# Patient Record
Sex: Female | Born: 1970 | Race: Black or African American | Hispanic: No | Smoking: Never smoker
Health system: Southern US, Community
[De-identification: ages and names within clinical notes are randomized; demographics above are authoritative.]

## PROBLEM LIST (undated history)

## (undated) DIAGNOSIS — I10 Essential (primary) hypertension: Secondary | ICD-10-CM

## (undated) DIAGNOSIS — J45909 Unspecified asthma, uncomplicated: Secondary | ICD-10-CM

## (undated) DIAGNOSIS — L309 Dermatitis, unspecified: Secondary | ICD-10-CM

## (undated) HISTORY — DX: Dermatitis, unspecified: L30.9

---

## 2018-01-16 ENCOUNTER — Ambulatory Visit (HOSPITAL_COMMUNITY)
Admission: EM | Admit: 2018-01-16 | Discharge: 2018-01-16 | Disposition: A | Discharge status: Home / Self Care | Payer: Medicaid Other | Attending: Family Medicine | Admitting: Family Medicine

## 2018-01-16 ENCOUNTER — Encounter (HOSPITAL_COMMUNITY): Payer: Self-pay | Admitting: Emergency Medicine

## 2018-01-16 ENCOUNTER — Other Ambulatory Visit: Payer: Self-pay

## 2018-01-16 DIAGNOSIS — Z8249 Family history of ischemic heart disease and other diseases of the circulatory system: Secondary | ICD-10-CM | POA: Insufficient documentation

## 2018-01-16 DIAGNOSIS — B349 Viral infection, unspecified: Secondary | ICD-10-CM | POA: Insufficient documentation

## 2018-01-16 DIAGNOSIS — I10 Essential (primary) hypertension: Secondary | ICD-10-CM | POA: Insufficient documentation

## 2018-01-16 DIAGNOSIS — J3489 Other specified disorders of nose and nasal sinuses: Secondary | ICD-10-CM | POA: Insufficient documentation

## 2018-01-16 DIAGNOSIS — J029 Acute pharyngitis, unspecified: Secondary | ICD-10-CM | POA: Insufficient documentation

## 2018-01-16 DIAGNOSIS — R0981 Nasal congestion: Secondary | ICD-10-CM

## 2018-01-16 DIAGNOSIS — J069 Acute upper respiratory infection, unspecified: Secondary | ICD-10-CM | POA: Insufficient documentation

## 2018-01-16 DIAGNOSIS — H9209 Otalgia, unspecified ear: Secondary | ICD-10-CM | POA: Insufficient documentation

## 2018-01-16 DIAGNOSIS — R05 Cough: Secondary | ICD-10-CM

## 2018-01-16 DIAGNOSIS — J45909 Unspecified asthma, uncomplicated: Secondary | ICD-10-CM | POA: Insufficient documentation

## 2018-01-16 HISTORY — DX: Unspecified asthma, uncomplicated: J45.909

## 2018-01-16 HISTORY — DX: Essential (primary) hypertension: I10

## 2018-01-16 MED ORDER — FLUTICASONE PROPIONATE 50 MCG/ACT NA SUSP: 1.0000 | Freq: Every day | NASAL | 2 refills | Status: DC

## 2018-01-16 MED ORDER — CETIRIZINE HCL 10 MG PO TABS: 10.0000 mg | ORAL_TABLET | Freq: Every day | ORAL | 0 refills | Status: DC

## 2018-01-16 NOTE — Discharge Instructions (Addendum)
Rapid strep test was negative This is most likely a viral infection We will treat the congestion with Flonase nasal spray and Zyrtec daily You can also try mucinex for cough and congestion.  Follow up as needed for continued or worsening symptoms

## 2018-01-16 NOTE — ED Triage Notes (Signed)
One week history of cold symptoms.  Bilateral ear pain, sore throat, and cough.

## 2018-01-16 NOTE — ED Provider Notes (Signed)
[facility]    [name]: [id number] Arrival date & time: 01/16/18  1300     History   Chief Complaint Chief Complaint  Patient presents with  . URI    HPI Deborah Mcclain is a 47 y.o. female.    URI  Presenting symptoms: congestion, cough, ear pain, rhinorrhea and sore throat   Severity:  Moderate Duration:  1 week Timing:  Constant Progression:  Waxing and waning Chronicity:  New Relieved by:  Hot fluids Worsened by:  Nothing Associated symptoms: sinus pain   Associated symptoms: no arthralgias, no headaches, no myalgias, no neck pain, no sneezing, no swollen glands and no wheezing   Risk factors: sick contacts   Risk factors: no recent illness and no recent travel     Past Medical History:  Diagnosis Date  . Asthma   . Hypertension     There are no active problems to display for this patient.   History reviewed. No pertinent surgical history.  OB History   None      Home Medications    Prior to Admission medications   Medication Sig Start Date End Date Taking? Authorizing Provider  NON FORMULARY    Yes [provider]  cetirizine (ZYRTEC) 10 MG tablet Take 1 tablet (10 mg total) by mouth daily. 01/16/18   Laterra Lubinski, Gloris Manchester A, NP  fluticasone (FLONASE) 50 MCG/ACT nasal spray Place 1 spray into both nostrils daily. 01/16/18   Janace Aris, NP    Family History Family History  Problem Relation Age of Onset  . Diabetes Mother   . Hypertension Mother     Social History Social History   Tobacco Use  . Smoking status: Never Smoker  Substance Use Topics  . Alcohol use: Yes  . Drug use: Never     Allergies   Patient has no known allergies.   Review of Systems Review of Systems  HENT: Positive for congestion, ear pain, rhinorrhea, sinus pain and sore throat. Negative for sneezing.   Respiratory: Positive for cough. Negative for wheezing.   Musculoskeletal: Negative for arthralgias, myalgias and neck pain.  Neurological: Negative  for headaches.     Physical Exam Triage Vital Signs ED Triage Vitals  Enc Vitals Group     BP 01/16/18 1403 (!) 153/86     Pulse Rate 01/16/18 1403 73     Resp 01/16/18 1403 20     Temp 01/16/18 1403 98.5 F (36.9 C)     Temp Source 01/16/18 1403 Oral     SpO2 01/16/18 1403 100 %     Weight --      Height --      Head Circumference --      Peak Flow --      Pain Score 01/16/18 1359 8     Pain Loc --      Pain Edu? --      Excl. in GC? --    No data found.  Updated Vital Signs BP (!) 153/86 (BP Location: Right Arm)   Pulse 73   Temp 98.5 F (36.9 C) (Oral)   Resp 20   SpO2 100%   Visual Acuity Right Eye Distance:   Left Eye Distance:   Bilateral Distance:    Right Eye Near:   Left Eye Near:    Bilateral Near:     Physical Exam  Constitutional: She appears well-developed and well-nourished.  Very pleasant. Non toxic or ill appearing.   HENT:  Head: Normocephalic and atraumatic.  Right Ear: External ear normal.  Left Ear: External ear normal.  Bilateral TMs normal.  External ears normal.  Mild posterior oropharyngeal erythema, 2+ tonsillar swelling without exudates. No lesions.  Mild nasal turbinate swelling.  No lymphadenopathy.    Eyes: Conjunctivae are normal.  Neck: Normal range of motion.  Cardiovascular: Normal rate, regular rhythm and normal heart sounds.  Pulmonary/Chest: Effort normal and breath sounds normal.  Lungs clear in all fields. No dyspnea or distress. No retractions or nasal flaring.   Musculoskeletal: Normal range of motion.  Lymphadenopathy:    She has no cervical adenopathy.  Neurological: She is alert.  Skin: Skin is warm. No rash noted. No erythema. No pallor.  Psychiatric: She has a normal mood and affect.  Nursing note and vitals reviewed.    UC Treatments / Results  Labs (all labs ordered are listed, but only abnormal results are displayed) Labs Reviewed  CULTURE, GROUP A STREP [facility])    EKG None  Radiology No  results found.  Procedures Procedures (including critical care time)  Medications Ordered in UC Medications - No data to display  Initial Impression / Assessment and Plan / UC Course  I have reviewed the triage vital signs and the nursing notes.  Pertinent labs & imaging results that were available during my care of the patient were reviewed by me and considered in my medical decision making (see chart for details).     Rapid strep negative Most likely viral URI We will treat with Zyrtec and Flonase daily mucinex for congestion.  Over-the-counter cough medication as needed Follow up as needed for continued or worsening symptoms  Final Clinical Impressions(s) / UC Diagnoses   Final diagnoses:  Viral syndrome     Discharge Instructions     Rapid strep test was negative This is most likely a viral infection We will treat the congestion with Flonase nasal spray and Zyrtec daily Follow up as needed for continued or worsening symptoms     ED Prescriptions    Medication Sig Dispense Auth. Provider   fluticasone (FLONASE) 50 MCG/ACT nasal spray Place 1 spray into both nostrils daily. 16 g Giovonnie Trettel A, NP   cetirizine (ZYRTEC) 10 MG tablet Take 1 tablet (10 mg total) by mouth daily. 30 tablet Dahlia Byes A, NP     Controlled Substance Prescriptions [place] Controlled Substance Registry consulted? Not Applicable   Janace Aris, NP 01/16/18 1438

## 2018-01-19 LAB — CULTURE, GROUP A STREP (THRC)

## 2018-05-01 ENCOUNTER — Encounter (HOSPITAL_COMMUNITY): Payer: Self-pay | Admitting: Emergency Medicine

## 2018-05-01 ENCOUNTER — Ambulatory Visit (HOSPITAL_COMMUNITY)
Admission: EM | Admit: 2018-05-01 | Discharge: 2018-05-01 | Disposition: A | Discharge status: Other Acute Inpatient Hospital | Payer: Medicaid Other

## 2018-05-01 ENCOUNTER — Emergency Department (HOSPITAL_COMMUNITY): Payer: Medicaid Other

## 2018-05-01 ENCOUNTER — Emergency Department (HOSPITAL_COMMUNITY)
Admission: EM | Admit: 2018-05-01 | Discharge: 2018-05-01 | Disposition: A | Discharge status: Home / Self Care | Payer: Medicaid Other | Attending: Emergency Medicine | Admitting: Emergency Medicine

## 2018-05-01 DIAGNOSIS — I10 Essential (primary) hypertension: Secondary | ICD-10-CM | POA: Insufficient documentation

## 2018-05-01 DIAGNOSIS — J45909 Unspecified asthma, uncomplicated: Secondary | ICD-10-CM | POA: Diagnosis not present

## 2018-05-01 DIAGNOSIS — F0781 Postconcussional syndrome: Secondary | ICD-10-CM

## 2018-05-01 DIAGNOSIS — Z79899 Other long term (current) drug therapy: Secondary | ICD-10-CM | POA: Insufficient documentation

## 2018-05-01 DIAGNOSIS — M4802 Spinal stenosis, cervical region: Secondary | ICD-10-CM | POA: Diagnosis not present

## 2018-05-01 DIAGNOSIS — R51 Headache: Secondary | ICD-10-CM | POA: Diagnosis present

## 2018-05-01 DIAGNOSIS — M5412 Radiculopathy, cervical region: Secondary | ICD-10-CM

## 2018-05-01 MED ORDER — IBUPROFEN 600 MG PO TABS: 600.0000 mg | ORAL_TABLET | Freq: Four times a day (QID) | ORAL | 0 refills | Status: DC | PRN

## 2018-05-01 MED ORDER — METHOCARBAMOL 500 MG PO TABS: 1000.0000 mg | ORAL_TABLET | Freq: Three times a day (TID) | ORAL | 0 refills | Status: DC | PRN
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
## 2018-05-01 NOTE — ED Notes (Signed)
Patient verbalizes understanding of discharge instructions. Opportunity for questioning and answers were provided. Armband removed by staff, pt discharged from ED ambulatory.   

## 2018-07-03 ENCOUNTER — Encounter (HOSPITAL_COMMUNITY): Payer: Self-pay | Admitting: Emergency Medicine

## 2018-07-03 ENCOUNTER — Other Ambulatory Visit: Payer: Self-pay

## 2018-07-03 ENCOUNTER — Ambulatory Visit (HOSPITAL_COMMUNITY)
Admission: EM | Admit: 2018-07-03 | Discharge: 2018-07-03 | Disposition: A | Discharge status: Home / Self Care | Payer: Medicaid Other | Attending: Family Medicine | Admitting: Family Medicine

## 2018-07-03 DIAGNOSIS — M5412 Radiculopathy, cervical region: Secondary | ICD-10-CM | POA: Diagnosis not present

## 2018-07-03 MED ORDER — METHOCARBAMOL 500 MG PO TABS: 1000.0000 mg | ORAL_TABLET | Freq: Three times a day (TID) | ORAL | 0 refills | Status: DC | PRN

## 2018-07-03 MED ORDER — PREDNISONE 10 MG (21) PO TBPK: ORAL_TABLET | Freq: Every day | ORAL | 0 refills | Status: DC
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
## 2018-07-03 NOTE — ED Triage Notes (Addendum)
Neck pain started Saturday.  Pain then started radiating down both arms.  Left arm pain radiates into fingers.   Patient was playing around with son and fell, but no pain at that time.  Patient reports she stands all day at work and questioning if this is the cause

## 2018-12-13 DIAGNOSIS — E119 Type 2 diabetes mellitus without complications: Secondary | ICD-10-CM

## 2018-12-13 HISTORY — DX: Type 2 diabetes mellitus without complications: E11.9

## 2019-02-04 ENCOUNTER — Other Ambulatory Visit: Payer: Self-pay | Admitting: Family Medicine

## 2019-02-04 DIAGNOSIS — Z1231 Encounter for screening mammogram for malignant neoplasm of breast: Secondary | ICD-10-CM

## 2019-02-26 ENCOUNTER — Ambulatory Visit: Payer: Medicaid Other | Admitting: Obstetrics & Gynecology

## 2019-03-14 ENCOUNTER — Ambulatory Visit: Payer: Medicaid Other | Admitting: Women's Health

## 2019-03-21 ENCOUNTER — Encounter: Payer: Self-pay | Admitting: Gastroenterology

## 2019-03-25 ENCOUNTER — Telehealth: Payer: Self-pay | Admitting: *Deleted

## 2019-03-25 NOTE — Telephone Encounter (Signed)
Direct at [facility] however as of 04/08/2019 [facility] hospitals are stopping elective, non urgent procedures until covid-19 inpatient surge abates so we have to wait to schedule screening and other non urgent procedures. [facility] ambulatory surgical centers, such as the [facility], continue with normal operations.

## 2019-03-25 NOTE — Telephone Encounter (Signed)
Sheri,  I was not sure if Dr Russella Dar had any openings before this starts the 25th .  Do I need to call this pt and explain the situation and cancel her and put in a recall for later this year?  Please advise, Thanks  Hilda Lias

## 2019-03-25 NOTE — Telephone Encounter (Signed)
Dr Russella Dar,  This pt has no GI hx- she is 48 and was referred for a screening colon- here BMI as of 12-19-2018 is 53.87.  She has a hx of Hypertension, asthma, GERD and Neuropathy in her feet.   Do you want an OV or can she be direct at [facility]?  Please advise, thanks so much, Hilda Lias

## 2019-03-25 NOTE — Telephone Encounter (Signed)
No he is full for his next day on 1/18.  Yes will need recall .  Thank you

## 2019-03-25 NOTE — Telephone Encounter (Signed)
Pt instructed we will have to cancel colon and PV-  I placed a recall for March 2021-  Pt aware of reason- I canceled Pv and Colon in the [facility]

## 2019-03-27 ENCOUNTER — Ambulatory Visit: Payer: Medicaid Other | Admitting: Allergy

## 2019-04-01 ENCOUNTER — Ambulatory Visit: Payer: Medicaid Other | Admitting: Advanced Practice Midwife

## 2019-04-10 ENCOUNTER — Encounter: Payer: Medicaid Other | Admitting: Gastroenterology

## 2019-04-16 ENCOUNTER — Other Ambulatory Visit (HOSPITAL_COMMUNITY)
Admission: RE | Admit: 2019-04-16 | Discharge: 2019-04-16 | Disposition: A | Discharge status: Home / Self Care | Payer: Medicaid Other | Source: Ambulatory Visit | Attending: Advanced Practice Midwife | Admitting: Advanced Practice Midwife

## 2019-04-16 ENCOUNTER — Encounter: Payer: Self-pay | Admitting: Advanced Practice Midwife

## 2019-04-16 ENCOUNTER — Other Ambulatory Visit: Payer: Self-pay

## 2019-04-16 ENCOUNTER — Ambulatory Visit: Payer: Medicaid Other | Admitting: Advanced Practice Midwife

## 2019-04-16 VITALS — BP 168/106 | HR 85 | Ht 64.0 in | Wt 308.8 lb

## 2019-04-16 DIAGNOSIS — Z0000 Encounter for general adult medical examination without abnormal findings: Secondary | ICD-10-CM

## 2019-04-16 DIAGNOSIS — Z01419 Encounter for gynecological examination (general) (routine) without abnormal findings: Secondary | ICD-10-CM

## 2019-04-16 DIAGNOSIS — E119 Type 2 diabetes mellitus without complications: Secondary | ICD-10-CM | POA: Insufficient documentation

## 2019-04-16 DIAGNOSIS — B372 Candidiasis of skin and nail: Secondary | ICD-10-CM

## 2019-04-16 DIAGNOSIS — Z794 Long term (current) use of insulin: Secondary | ICD-10-CM | POA: Insufficient documentation

## 2019-04-16 DIAGNOSIS — N951 Menopausal and female climacteric states: Secondary | ICD-10-CM

## 2019-04-16 DIAGNOSIS — Z113 Encounter for screening for infections with a predominantly sexual mode of transmission: Secondary | ICD-10-CM | POA: Insufficient documentation

## 2019-04-16 DIAGNOSIS — R4586 Emotional lability: Secondary | ICD-10-CM

## 2019-04-16 DIAGNOSIS — I10 Essential (primary) hypertension: Secondary | ICD-10-CM | POA: Insufficient documentation

## 2019-04-16 DIAGNOSIS — Z6841 Body Mass Index (BMI) 40.0 and over, adult: Secondary | ICD-10-CM

## 2019-04-16 DIAGNOSIS — Z124 Encounter for screening for malignant neoplasm of cervix: Secondary | ICD-10-CM | POA: Insufficient documentation

## 2019-04-16 DIAGNOSIS — Z1231 Encounter for screening mammogram for malignant neoplasm of breast: Secondary | ICD-10-CM

## 2019-04-16 DIAGNOSIS — E1169 Type 2 diabetes mellitus with other specified complication: Secondary | ICD-10-CM

## 2019-04-16 MED ORDER — VENLAFAXINE HCL ER 75 MG PO CP24: 75.0000 mg | ORAL_CAPSULE | Freq: Every day | ORAL | 3 refills | Status: DC

## 2019-04-16 MED ORDER — NYSTATIN-TRIAMCINOLONE 100000-0.1 UNIT/GM-% EX OINT: 1.0000 "application " | TOPICAL_OINTMENT | Freq: Two times a day (BID) | CUTANEOUS | 2 refills | Status: DC

## 2019-04-16 NOTE — Progress Notes (Signed)
Subjective:     Deborah Mcclain is a 49 y.o. female here at [facility] for a routine exam. Her medical hx is significant for essential HTN on medications and DM Type II on Metformin. She did not take her BP medications today but reports she usually takes them.  She reports some menopausal symptoms including mood swings and night sweats.  The worst symptom is the mood swings.  She also has an itchy, flaky rash around her neck, and between and under her breasts. It started a month or two ago and has worsened.  She denies any chest pain, shortness of breath, numbness, or tingling, or fever/chills.    Do you have a primary care provider? Yes, last seen 12/2018     Gynecologic History Patient's last menstrual period was 09/27/2018. Contraception: tubal ligation Last Pap: unknown. Results were: normal Last mammogram: 2019. Results were: normal  Obstetric History OB History  Gravida Para Term Preterm AB Living  2 2 1 1   3   SAB TAB Ectopic Multiple Live Births        1 3    # Outcome Date GA Lbr Len/2nd Weight Sex Delivery Anes PTL Lv  2A Preterm 03/30/05    F CS-LTranv   LIV  2B Preterm 03/30/05    M CS-LTranv   LIV  1 Term 05/25/97    M CS-LTranv   LIV     The following portions of the patient's history were reviewed and updated as appropriate: allergies, current medications, past family history, past medical history, past social history, past surgical history and problem list.  Review of Systems Pertinent items noted in HPI and remainder of comprehensive ROS otherwise negative.    Objective:   BP (!) 168/106   Pulse 85   Ht 5\' 4"  (1.626 m)   Wt (!) 308 lb 12.8 oz (140.1 kg)   LMP 09/27/2018   BMI 53.01 kg/m \ VS reviewed, nursing note reviewed,  Constitutional: well developed, well nourished, no distress HEENT: normocephalic CV: normal rate Pulm/chest wall: normal effort Breast Exam:  right breast normal without mass, skin or nipple changes or axillary nodes, left breast  normal without mass, skin or nipple changes or axillary nodes.  In between breasts and under breasts bilaterally there is a flaky, grey/white rash with mild erythema  Abdomen: soft Neuro: alert and oriented x 3 Skin: warm, dry, flaky, grey/white rash in ring around lower neck and at sternum in between breasts and below both breasts Psych: affect normal Pelvic exam: Cervix pink, visually closed, without lesion, scant white creamy discharge, vaginal walls and external genitalia normal Bimanual exam: Cervix 0/long/high, firm, anterior, neg CMT, uterus nontender, nonenlarged, adnexa without tenderness, enlargement, or mass     Assessment/Plan:   1. Encounter for screening mammogram for malignant neoplasm of breast  - MM DIGITAL SCREENING BILATERAL; Future  2. Screening for cervical cancer  - Cytology - PAP( [place])  3. Screening examination for STD (sexually transmitted disease)  - Hepatitis B surface antigen - Hepatitis C antibody - HIV Antibody (routine testing w rflx) - RPR - Cervicovaginal ancillary only( [place])  4. Well woman exam with routine gynecological exam --No menses x 7 months with menopausal symptoms increasing in last 4-6 months. --Otherwise no gyn concerns. Not currently sexually active.  5. Essential hypertension --On meds x 2, did not take today.  Encouraged pt to take daily medications, f/u with PCP if BP not well controlled. --No cardiac or neurological symptoms today. Pt  feeling well.  6. Diabetes mellitus type 2 in obese ([place])   7. Morbid obesity with BMI of 50.0-59.9, adult ([place])  8. Mood swings --Likely related to perimenopausal symptoms.  See below. - venlafaxine XR (EFFEXOR-XR) 75 MG 24 hr capsule; Take 1 capsule (75 mg total) by mouth daily.  Dispense: 30 capsule; Refill: 3  9. Menopausal vasomotor syndrome --Discussed comfort measures, cool compresses, cotton clothing, changing sheets, using a fan at night.   --Consult Dr Rip Harbour. Pt has  uterus, no hx hysterectomy. Given mood swings as associated symptoms, recommend Effexor with follow up in 8 weeks. - venlafaxine XR (EFFEXOR-XR) 75 MG 24 hr capsule; Take 1 capsule (75 mg total) by mouth daily.  Dispense: 30 capsule; Refill: 3 --F/U with MD in 8 weeks in office.  10. Skin candidiasis --Skin rash c/w yeast, may have allergic component so Rx for combination steroid/antifungal cream.  Pt to notify office if cream is costly, can switch to nystatin or other antifungal cream and separate triamcinolone ointment.  --F/U with dermatology as scheduled - nystatin-triamcinolone ointment (MYCOLOG); Apply 1 application topically 2 (two) times daily.  Dispense: 30 g; Refill: 2    Follow up in: 8 weeks or as needed.   Fatima Blank, CNM 3:22 PM

## 2019-04-16 NOTE — Progress Notes (Signed)
Pt presents as a New Patient AEX. Patient states that her last pap was 2018 and was normal. Last MM 2018: Normal. Next MM not scheduled.

## 2019-04-16 NOTE — Addendum Note (Signed)
Addended by: Sharen Counter A on: 04/16/2019 05:58 PM   Modules accepted: Level of Service

## 2019-04-17 ENCOUNTER — Encounter: Payer: Self-pay | Admitting: Allergy

## 2019-04-17 ENCOUNTER — Ambulatory Visit (INDEPENDENT_AMBULATORY_CARE_PROVIDER_SITE_OTHER): Payer: Medicaid Other | Admitting: Allergy

## 2019-04-17 VITALS — BP 162/92 | HR 74 | Temp 97.2°F | Resp 16 | Ht 64.0 in | Wt 310.6 lb

## 2019-04-17 DIAGNOSIS — K9049 Malabsorption due to intolerance, not elsewhere classified: Secondary | ICD-10-CM

## 2019-04-17 DIAGNOSIS — H1013 Acute atopic conjunctivitis, bilateral: Secondary | ICD-10-CM | POA: Diagnosis not present

## 2019-04-17 DIAGNOSIS — J4520 Mild intermittent asthma, uncomplicated: Secondary | ICD-10-CM | POA: Diagnosis not present

## 2019-04-17 DIAGNOSIS — L309 Dermatitis, unspecified: Secondary | ICD-10-CM

## 2019-04-17 DIAGNOSIS — J3089 Other allergic rhinitis: Secondary | ICD-10-CM | POA: Diagnosis not present

## 2019-04-17 LAB — HEPATITIS C ANTIBODY: Hep C Virus Ab: <0.1 s/co ratio (ref 0.0–0.9)

## 2019-04-17 LAB — HIV ANTIBODY (ROUTINE TESTING W REFLEX): HIV Screen 4th Generation wRfx: NONREACTIVE

## 2019-04-17 LAB — CERVICOVAGINAL ANCILLARY ONLY
Chlamydia: NEGATIVE
Comment: NEGATIVE
Comment: NEGATIVE
Comment: NORMAL
Neisseria Gonorrhea: NEGATIVE
Trichomonas: NEGATIVE

## 2019-04-17 LAB — CYTOLOGY - PAP
Comment: NEGATIVE
Diagnosis: NEGATIVE
High risk HPV: NEGATIVE

## 2019-04-17 LAB — RPR: RPR Ser Ql: NONREACTIVE

## 2019-04-17 LAB — HEPATITIS B SURFACE ANTIGEN: Hepatitis B Surface Ag: NEGATIVE

## 2019-04-17 MED ORDER — AZELASTINE HCL 0.1 % NA SOLN: 2.0000 | Freq: Two times a day (BID) | NASAL | 5 refills | Status: DC

## 2019-04-17 MED ORDER — MONTELUKAST SODIUM 10 MG PO TABS: 10.0000 mg | ORAL_TABLET | Freq: Every day | ORAL | 5 refills | Status: DC

## 2019-04-17 MED ORDER — LEVOCETIRIZINE DIHYDROCHLORIDE 5 MG PO TABS: 5.0000 mg | ORAL_TABLET | Freq: Every evening | ORAL | 5 refills | Status: DC

## 2019-04-17 NOTE — Progress Notes (Signed)
New Patient Note  RE: Deborah Mcclain MRN: [id number] DOB: 1971-01-31 Date of Office Visit: 04/17/2019  Referring provider: Laruth Bouchard, MD Primary care provider: Marikay Alar, FNP  Chief Complaint: Allergies and asthma  History of present illness: Deborah Mcclain is a 49 y.o. female presenting today for consultation for asthma and allergic rhinitis.  She was diagnosed with asthma around age 26 years old. She reports symptoms of SOB, coughing, difficulty breathing, wheezing, chest tightness.  She does report respiratory illnesses are a trigger or when it gets too hot/humid.  She also does feel when her allergies are worse that her asthma can be impacted.  She does recall needing steroids about twice since her diagnosis.  Has never required any hospitalizations.  She was on a maintenance controller inhaler around 2019 which was discontinued as she was under good control.    She currently only has an albuterol inhaler that at this time she has really infrequent use.  She states that her allergies and asthma symptoms have been a bit better here than when she lived in [place] and in [place].  She has been in the [place] area for the past 2 years.  With her allergies she reports symptoms of itchy/watery eyes, scratchy throat, nasal congestion/drainage, throat clearing.    Pollen seasons are worse for her but symptoms are year-round.  She has been on loratadine for the past couple of years.  She has also taken Zyrtec in the past.  Currently she also states she is using Benadryl at night.  She uses Flonase 2 sprays each nostril daily for the past several years and does report that it helps with her congestion.  She states she is not good at putting eyedrops in her eyes.  Over the past 4 months or so she has noted that when she eats ketchup, spaghetti sauce or anything that is tomato based it will cause her to have itchiness around her neck especially.  The same thing happens with onions.   She states she has been having this itchy darkening rash around her neck that seems to be getting worse.  She states she has been told it is related to insulin resistance or that it could be eczema.  She does not feel that it is related to insulin resistance. She has been applying a cortisone and aloe combination as she states does help decrease the itch.  She does have an upcoming dermatology appointment at the end of the month.  She states another provider did recently recommend she use a nystatin-triamcinolone combination ointment that she has not picked up from the pharmacy yet. She does mention that before the rash and itching started she was bit by something on her neck and then she started noticing the itch more.  No history of food allergy as child.    Review of systems: Review of Systems  Constitutional: Negative.   HENT: Positive for congestion. Negative for ear discharge, nosebleeds and sinus pain.   Eyes: Negative.   Respiratory: Positive for cough.   Cardiovascular: Negative.   Gastrointestinal: Negative.   Musculoskeletal: Negative.   Skin: Positive for itching and rash.  Neurological: Negative.     All other systems negative unless noted above in HPI  Past medical history: Past Medical History:  Diagnosis Date  . Asthma   . Eczema   . Hypertension   . Type 2 diabetes mellitus (HCC) 12/2018    Past surgical history: Past Surgical History:  Procedure Laterality Date  .  CESAREAN SECTION  05/25/1997  . CESAREAN SECTION  03/30/2005   twins    Family history:  Family History  Problem Relation Age of Onset  . Diabetes Mother   . Hypertension Mother   . Diabetes Maternal Grandmother     Social history: Lives in an apartment without carpeting with electric heating and central cooling.  No pets in the home.  No concern for water damage, roaches or mildew in the home.  She is currently unemployed.  Denies a smoking history.  Medication List: Current Outpatient  Medications  Medication Sig Dispense Refill  . cetirizine (ZYRTEC) 10 MG tablet Take 1 tablet (10 mg total) by mouth daily. 30 tablet 0  . fluticasone (FLONASE) 50 MCG/ACT nasal spray Place 1 spray into both nostrils daily. 16 g 2  . gabapentin (NEURONTIN) 100 MG capsule Take 100 mg by mouth 3 (three) times daily.    . hydrochlorothiazide (HYDRODIURIL) 25 MG tablet Take 25 mg by mouth daily.    . metFORMIN (GLUCOPHAGE) 500 MG tablet Take by mouth 2 (two) times daily with a meal.    . metoprolol tartrate (LOPRESSOR) 50 MG tablet Take 50 mg by mouth 2 (two) times daily.    Marland Kitchen nystatin-triamcinolone ointment (MYCOLOG) Apply 1 application topically 2 (two) times daily. 30 g 2  . venlafaxine XR (EFFEXOR-XR) 75 MG 24 hr capsule Take 1 capsule (75 mg total) by mouth daily. 30 capsule 3  . azelastine (ASTELIN) 0.1 % nasal spray Place 2 sprays into both nostrils 2 (two) times daily. 30 mL 5  . levocetirizine (XYZAL) 5 MG tablet Take 1 tablet (5 mg total) by mouth every evening. 30 tablet 5  . montelukast (SINGULAIR) 10 MG tablet Take 1 tablet (10 mg total) by mouth at bedtime. 30 tablet 5  . NON FORMULARY      No current facility-administered medications for this visit.    Known medication allergies: Allergies  Allergen Reactions  . Other     Ketchup, causes itching     Physical examination: Blood pressure (!) 162/92, pulse 74, temperature (!) 97.2 F (36.2 C), temperature source Temporal, resp. rate 16, height 5\' 4"  (1.626 m), weight (!) 310 lb 9.6 oz (140.9 kg), SpO2 99 %.  General: Alert, interactive, in no acute distress. HEENT: PERRLA, TMs pearly gray, turbinates mildly edematous without discharge, post-pharynx non erythematous. Neck: Supple without lymphadenopathy. Lungs: Clear to auscultation without wheezing, rhonchi or rales. {no increased work of breathing. CV: Normal S1, S2 without murmurs. Abdomen: Nondistended, nontender. Skin: Neck with a hyperpigmented plaque all around the  neck with some areas of lichenification..  Ear pinna with mild flakiness Extremities:  No clubbing, cyanosis or edema. Neuro:   Grossly intact.  Diagnositics/Labs:  Spirometry: FEV1: 1.61L 69%, FVC: 1.85L 63%, post albuterol use she had a significant improvement in FEV1 to 2.15 L or 92% predicted which is a 34% increase.  FVC also normalized.  Allergy testing: Environmental allergy skin prick testing is positive to grass pollens, weed pollens, tree pollens, Curvularia, dust mites and cockroach. Select food allergy skin prick testing is negative to tomato and onion. Allergy testing results were read and interpreted by provider, documented by clinical staff.   Assessment and plan: Allergic rhinitis with conjunctivitis  - environmental allergy skin testing is positive to grass pollens, weed pollens, tree pollens, mold, dust mites, cockroach.    - start Singulair 10mg  daily at bedtime.  This is not an antihistamine and works alongside antihistamines to help in control of  allergy and asthma symptoms.  If you notice any change in mood/behavior/sleep after starting Singulair then stop this medication and let us know.  Symptoms resolve after stopping the medication.    - stop Claritin (Loratadine) and change to Xyzal 5mg  daily.  This is a long-acting antihistamine like Claritin that you have not tried  - for nasal congestion continue Flonase 2 sprays each nostril daily.  Use for 1-2 weeks at a time before stopping once symptoms improve  - for nasal drainage recommend use of nasal antihistamine, Astelin 2 sprays each nostril twice a day as needed  - if medication management is not effective enough in controlling symptoms then recommend course of allergen immunotherapy (information provided).      Asthma, mild intermittent  - have access to albuterol inhaler 2 puffs every 4-6 hours as needed for cough/wheeze/shortness of breath/chest tightness.  May use 15-20 minutes prior to activity.   Monitor  frequency of use.    - Singulair as above  Asthma control goals:   Full participation in all desired activities (may need albuterol before activity)  Albuterol use two time or less a week on average (not counting use with activity)  Cough interfering with sleep two time or less a month  Oral steroids no more than once a year  No hospitalizations  Adverse food reaction/food intolerance  - skin testing to tomato and onion are negative.  Thus at this time do not have Ige mediated food allergy to these foods and likely indicates intolerance.   - would avoid in diet to prevent symptoms with ingestion  Dermatitis  -Very hyperpigmented ring of around her neck with some areas that appear to be lichenified.  She will try use of the nystatin-triamcinolone ointment preceding her dermatology appointment. Follow-up 3-4 months or sooner if needed  I appreciate the opportunity to take part in [place] care. Please do not hesitate to contact me with questions.  Sincerely,   Prudy Feeler, MD Allergy/Immunology [facility] [place] [facility] [place] [facility]

## 2019-04-17 NOTE — Patient Instructions (Addendum)
Allergies   - environmental allergy skin testing is positive to grass pollens, weed pollens, tree pollens, mold, dust mites, cockroach.    - start Singulair 10mg  daily at bedtime.  This is not an antihistamine and works alongside antihistamines to help in control of allergy and asthma symptoms.  If you notice any change in mood/behavior/sleep after starting Singulair then stop this medication and let know.  Symptoms resolve after stopping the medication.    - stop Claritin (Loratadine) and change to Xyzal 5mg  daily.  This is a long-acting antihistamine like Claritin that you have not tried  - for nasal congestion continue Flonase 2 sprays each nostril daily.  Use for 1-2 weeks at a time before stopping once symptoms improve  - for nasal drainage recommend use of nasal antihistamine, Astelin 2 sprays each nostril twice a day as needed  - if medication management is not effective enough in controlling symptoms then recommend course of allergen immunotherapy (information provided).       Asthma  - have access to albuterol inhaler 2 puffs every 4-6 hours as needed for cough/wheeze/shortness of breath/chest tightness.  May use 15-20 minutes prior to activity.   Monitor frequency of use.    - Singulair as above  Asthma control goals:   Full participation in all desired activities (may need albuterol before activity)  Albuterol use two time or less a week on average (not counting use with activity)  Cough interfering with sleep two time or less a month  Oral steroids no more than once a year  No hospitalizations   Adverse food reaction  - skin testing to tomato and onion are negative.  Thus at this time do not have Ige mediated food allergy to these foods and likely indicates intolerance.   - would avoid in diet to prevent symptoms with ingestion  Follow-up 3-4 months or sooner if needed

## 2019-04-18 ENCOUNTER — Other Ambulatory Visit: Payer: Self-pay

## 2019-04-18 DIAGNOSIS — B372 Candidiasis of skin and nail: Secondary | ICD-10-CM

## 2019-04-18 MED ORDER — NYSTATIN-TRIAMCINOLONE 100000-0.1 UNIT/GM-% EX OINT: 1.0000 "application " | TOPICAL_OINTMENT | Freq: Two times a day (BID) | CUTANEOUS | 2 refills | Status: DC

## 2019-04-18 NOTE — Progress Notes (Signed)
Mycolog ointment resent to pts pharmacy, they did not receive the first rx sent on 04/16/19. Pt made aware.

## 2019-06-06 ENCOUNTER — Other Ambulatory Visit: Payer: Self-pay

## 2019-06-06 DIAGNOSIS — Z1211 Encounter for screening for malignant neoplasm of colon: Secondary | ICD-10-CM

## 2019-06-17 ENCOUNTER — Ambulatory Visit: Payer: Medicaid Other | Admitting: Advanced Practice Midwife

## 2019-06-26 ENCOUNTER — Encounter: Payer: Self-pay | Admitting: Advanced Practice Midwife

## 2019-06-26 ENCOUNTER — Other Ambulatory Visit: Payer: Self-pay

## 2019-06-26 ENCOUNTER — Ambulatory Visit: Payer: Medicaid Other | Admitting: Advanced Practice Midwife

## 2019-06-26 VITALS — BP 173/93 | HR 91 | Ht 64.0 in | Wt 330.7 lb

## 2019-06-26 DIAGNOSIS — Z6841 Body Mass Index (BMI) 40.0 and over, adult: Secondary | ICD-10-CM

## 2019-06-26 DIAGNOSIS — I10 Essential (primary) hypertension: Secondary | ICD-10-CM

## 2019-06-26 DIAGNOSIS — R61 Generalized hyperhidrosis: Secondary | ICD-10-CM

## 2019-06-26 DIAGNOSIS — N951 Menopausal and female climacteric states: Secondary | ICD-10-CM

## 2019-06-26 MED ORDER — MEDROXYPROGESTERONE ACETATE 2.5 MG PO TABS: 2.5000 mg | ORAL_TABLET | Freq: Every day | ORAL | 2 refills | Status: DC

## 2019-06-26 MED ORDER — ESTRADIOL 1 MG PO TABS: 1.0000 mg | ORAL_TABLET | Freq: Every day | ORAL | 2 refills | Status: DC

## 2019-06-26 NOTE — Progress Notes (Signed)
GYN c/o of heavy night sweats.

## 2019-06-26 NOTE — Progress Notes (Signed)
  GYNECOLOGY PROGRESS NOTE  History:  49 y.o. G2P1103 presents to [facility] office today for a follow up gyn visit. She was seen 04/16/19 with menopausal symptoms including mood swings and night sweats.  She was started on Effexor and reports that all symptoms are better but the night sweats have worsened in recent weeks.  She has HTN and takes HCTZ and Lopressor daily as prescribed. She denies h/a, dizziness, shortness of breath, n/v, or fever/chills.    The following portions of the patient's history were reviewed and updated as appropriate: allergies, current medications, past family history, past medical history, past social history, past surgical history and problem list. Last pap smear on 04/16/19 was normal, negative HRHPV.  Review of Systems:  Pertinent items are noted in HPI.   Objective:  Physical Exam Blood pressure (!) 173/93, pulse 91, height 5\' 4"  (1.626 m), weight (!) 330 lb 11.2 oz (150 kg).   VS reviewed, nursing note reviewed,  Constitutional: well developed, well nourished, no distress HEENT: normocephalic CV: normal rate Pulm/chest wall: normal effort Breast Exam: deferred Abdomen: soft Neuro: alert and oriented x 3 Skin: warm, dry Psych: affect normal Pelvic exam: Cervix pink, visually closed, without lesion, scant white creamy discharge, vaginal walls and external genitalia normal Bimanual exam: Cervix 0/long/high, firm, anterior, neg CMT, uterus nontender, nonenlarged, adnexa without tenderness, enlargement, or mass  Assessment & Plan:  1. Perimenopausal vasomotor symptoms --Pt had 6 months without menses but menses last month and this month resumed and were normal. --Mood swings improved on Effexor, night sweats are pt main concern today. --Consult Dr with pt medical history, symptoms, and current treatment. --Pt to try [place] Loop as herbal remedy for night sweats first, take 40 mg BID (or 80 mg at once daily). --If no marked improvement, pt may begin  course of low dose HRT with estradiol 1 mg and medroxyprogesterone 2.5 mg daily --Pt to see PCP ASAP for BP management, change in therapy/regimen --F/U with MD (female MD per pt preference) in 2 months  - estradiol (ESTRACE) 1 MG tablet; Take 1 tablet (1 mg total) by mouth daily.  Dispense: 30 tablet; Refill: 2 - medroxyPROGESTERone (PROVERA) 2.5 MG tablet; Take 1 tablet (2.5 mg total) by mouth daily.  Dispense: 30 tablet; Refill: 2  2. Night sweats   3. Morbid obesity with BMI of 50.0-59.9, adult (HCC)   4. Essential hypertension   07-10-1994, CNM 12:48 PM

## 2019-06-26 NOTE — Patient Instructions (Addendum)
For night sweats. Try Black Cohosh, 40 mg tablets. Take 2 daily (all at once with food or twice per day).     Menopause Menopause is the normal time of life when menstrual periods stop completely. It is usually confirmed by 12 months without a menstrual period. The transition to menopause (perimenopause) most often happens between the ages of 2 and 6. During perimenopause, hormone levels change in your body, which can cause symptoms and affect your health. Menopause may increase your risk for:  Loss of bone (osteoporosis), which causes bone breaks (fractures).  Depression.  Hardening and narrowing of the arteries (atherosclerosis), which can cause heart attacks and strokes. What are the causes? This condition is usually caused by a natural change in hormone levels that happens as you get older. The condition may also be caused by surgery to remove both ovaries (bilateral oophorectomy). What increases the risk? This condition is more likely to start at an earlier age if you have certain medical conditions or treatments, including:  A tumor of the pituitary gland in the brain.  A disease that affects the ovaries and hormone production.  Radiation treatment for cancer.  Certain cancer treatments, such as chemotherapy or hormone (anti-estrogen) therapy.  Heavy smoking and excessive alcohol use.  Family history of early menopause. This condition is also more likely to develop earlier in women who are very thin. What are the signs or symptoms? Symptoms of this condition include:  Hot flashes.  Irregular menstrual periods.  Night sweats.  Changes in feelings about sex. This could be a decrease in sex drive or an increased comfort around your sexuality.  Vaginal dryness and thinning of the vaginal walls. This may cause painful intercourse.  Dryness of the skin and development of wrinkles.  Headaches.  Problems sleeping (insomnia).  Mood swings or irritability.  Memory  problems.  Weight gain.  Hair growth on the face and chest.  Bladder infections or problems with urinating. How is this diagnosed? This condition is diagnosed based on your medical history, a physical exam, your age, your menstrual history, and your symptoms. Hormone tests may also be done. How is this treated? In some cases, no treatment is needed. You and your health care provider should make a decision together about whether treatment is necessary. Treatment will be based on your individual condition and preferences. Treatment for this condition focuses on managing symptoms. Treatment may include:  Menopausal hormone therapy (MHT).  Medicines to treat specific symptoms or complications.  Acupuncture.  Vitamin or herbal supplements. Before starting treatment, make sure to let your health care provider know if you have a personal or family history of:  Heart disease.  Breast cancer.  Blood clots.  Diabetes.  Osteoporosis. Follow these instructions at home: Lifestyle  Do not use any products that contain nicotine or tobacco, such as cigarettes and e-cigarettes. If you need help quitting, ask your health care provider.  Get at least 30 minutes of physical activity on 5 or more days each week.  Avoid alcoholic and caffeinated beverages, as well as spicy foods. This may help prevent hot flashes.  Get 7-8 hours of sleep each night.  If you have hot flashes, try: ? Dressing in layers. ? Avoiding things that may trigger hot flashes, such as spicy food, warm places, or stress. ? Taking slow, deep breaths when a hot flash starts. ? Keeping a fan in your home and office.  Find ways to manage stress, such as deep breathing, meditation, or journaling.  Consider going to group therapy with other women who are having menopause symptoms. Ask your health care provider about recommended group therapy meetings. Eating and drinking  Eat a healthy, balanced diet that contains whole  grains, lean protein, low-fat dairy, and plenty of fruits and vegetables.  Your health care provider may recommend adding more soy to your diet. Foods that contain soy include tofu, tempeh, and soy milk.  Eat plenty of foods that contain calcium and vitamin D for bone health. Items that are rich in calcium include low-fat milk, yogurt, beans, almonds, sardines, broccoli, and kale. Medicines  Take over-the-counter and prescription medicines only as told by your health care provider.  Talk with your health care provider before starting any herbal supplements. If prescribed, take vitamins and supplements as told by your health care provider. These may include: ? Calcium. Women age 27 and older should get 1,200 mg (milligrams) of calcium every day. ? Vitamin D. Women need 600-800 International Units of vitamin D each day. ? Vitamins B12 and B6. Aim for 50 micrograms of B12 and 1.5 mg of B6 each day. General instructions  Keep track of your menstrual periods, including: ? When they occur. ? How heavy they are and how long they last. ? How much time passes between periods.  Keep track of your symptoms, noting when they start, how often you have them, and how long they last.  Use vaginal lubricants or moisturizers to help with vaginal dryness and improve comfort during sex.  Keep all follow-up visits as told by your health care provider. This is important. This includes any group therapy or counseling. Contact a health care provider if:  You are still having menstrual periods after age 31.  You have pain during sex.  You have not had a period for 12 months and you develop vaginal bleeding. Get help right away if:  You have: ? Severe depression. ? Excessive vaginal bleeding. ? Pain when you urinate. ? A fast or irregular heart beat (palpitations). ? Severe headaches. ? Abdomen (abdominal) pain or severe indigestion.  You fell and you think you have a broken bone.  You develop leg  or chest pain.  You develop vision problems.  You feel a lump in your breast. Summary  Menopause is the normal time of life when menstrual periods stop completely. It is usually confirmed by 12 months without a menstrual period.  The transition to menopause (perimenopause) most often happens between the ages of 49 and 52.  Symptoms can be managed through medicines, lifestyle changes, and complementary therapies such as acupuncture.  Eat a balanced diet that is rich in nutrients to promote bone health and heart health and to manage symptoms during menopause. This information is not intended to replace advice given to you by your health care provider. Make sure you discuss any questions you have with your health care provider. Document Revised: 02/10/2017 Document Reviewed: 04/02/2016 Elsevier Patient Education  2020 [employer].

## 2019-07-02 ENCOUNTER — Other Ambulatory Visit: Payer: Self-pay

## 2019-07-02 ENCOUNTER — Encounter (HOSPITAL_COMMUNITY): Payer: Self-pay

## 2019-07-02 ENCOUNTER — Ambulatory Visit (HOSPITAL_COMMUNITY)
Admission: EM | Admit: 2019-07-02 | Discharge: 2019-07-02 | Disposition: A | Discharge status: Home / Self Care | Payer: Medicaid Other | Attending: Family Medicine | Admitting: Family Medicine

## 2019-07-02 DIAGNOSIS — Z20822 Contact with and (suspected) exposure to covid-19: Secondary | ICD-10-CM | POA: Diagnosis not present

## 2019-07-02 DIAGNOSIS — G589 Mononeuropathy, unspecified: Secondary | ICD-10-CM | POA: Diagnosis not present

## 2019-07-02 DIAGNOSIS — I10 Essential (primary) hypertension: Secondary | ICD-10-CM

## 2019-07-02 DIAGNOSIS — Z91018 Allergy to other foods: Secondary | ICD-10-CM | POA: Diagnosis not present

## 2019-07-02 DIAGNOSIS — R05 Cough: Secondary | ICD-10-CM | POA: Insufficient documentation

## 2019-07-02 DIAGNOSIS — Z833 Family history of diabetes mellitus: Secondary | ICD-10-CM | POA: Insufficient documentation

## 2019-07-02 DIAGNOSIS — M542 Cervicalgia: Secondary | ICD-10-CM | POA: Insufficient documentation

## 2019-07-02 DIAGNOSIS — Z8249 Family history of ischemic heart disease and other diseases of the circulatory system: Secondary | ICD-10-CM | POA: Insufficient documentation

## 2019-07-02 DIAGNOSIS — R059 Cough, unspecified: Secondary | ICD-10-CM

## 2019-07-02 DIAGNOSIS — E119 Type 2 diabetes mellitus without complications: Secondary | ICD-10-CM | POA: Diagnosis not present

## 2019-07-02 MED ORDER — PREDNISONE 10 MG (21) PO TBPK: ORAL_TABLET | Freq: Every day | ORAL | 0 refills | Status: DC

## 2019-07-02 MED ORDER — CYCLOBENZAPRINE HCL 10 MG PO TABS: ORAL_TABLET | ORAL | 0 refills | Status: DC

## 2019-07-02 NOTE — ED Provider Notes (Signed)
[facility]   [id number] 07/02/19 Arrival Time: 1141  ASSESSMENT & PLAN:  1. Pinched nerve in neck   2. Cough   3. Uncontrolled hypertension      COVID-19 testing sent. Cough is not worsening. Benign chest exam.  For neck pain/pinched nerve to begin: Meds ordered this encounter  Medications  . predniSONE (STERAPRED UNI-PAK 21 TAB) 10 MG (21) TBPK tablet    Sig: Take by mouth daily. Take as directed.    Dispense:  21 tablet    Refill:  0  . cyclobenzaprine (FLEXERIL) 10 MG tablet    Sig: Take 1 tablet by mouth 3 times daily as needed for muscle spasm. Warning: May cause drowsiness.    Dispense:  21 tablet    Refill:  0    Plans to f/u with PCP re: increased BP.    Discharge Instructions     Keep your follow up appointment with your PCP to discuss your blood pressure. Your blood pressure was noted to be elevated during your visit today. If you are currently taking medication for high blood pressure, please ensure you are taking this as directed. If you do not have a history of high blood pressure and your blood pressure remains persistently elevated, you may need to begin taking a medication at some point.  BP (!) 174/91 (BP Location: Left Arm)   You have been tested for COVID-19 today. If your test returns positive, you will receive a phone call from [facility] regarding your results. Negative test results are not called. Both positive and negative results area always visible on MyChart. If you do not have a MyChart account, sign up instructions are provided in your discharge papers. Please do not hesitate to contact us should you have questions or concerns.      Reviewed expectations re: course of current medical issues. Questions answered. Outlined signs and symptoms indicating need for more acute intervention. Understanding verbalized. After Visit Summary given.   SUBJECTIVE: History from: patient. Deborah Mcclain is a 49 y.o. female who reports  intermittent headache and body aches; fairly abrupt; x 5 days; dry coughing. "Chest hurts now from the cough". No SOB or wheezing reported. Ambulatory without difficulty. Afebrile. No known COVID contact. No recent travel.  Also reports R side neck pain; h/o similar; "a pinched nerve"; reports this radiates down arm and back. Muscle relaxer has helped in the past. Requests Rx. No OTC tx reported. No neck injury. No extremity sensation changes or weakness.   Increased blood pressure noted today. Reports that she is treated for hypertension.  She reports taking medications as instructed, no medication side effects noted, no chest pain on exertion, no dyspnea on exertion, no orthostatic dizziness or lightheadedness, no orthopnea or paroxysmal nocturnal dyspnea and no palpitations.  Social History   Tobacco Use  Smoking Status Never Smoker  Smokeless Tobacco Never Used     OBJECTIVE:  Vitals:   07/02/19 1246  BP: (!) 174/91  Pulse: 66  Resp: 18  Temp: 98.2 F (36.8 C)  TempSrc: Oral  SpO2: 100%    General appearance: alert; no distress; obese Eyes: PERRLA; EOMI; conjunctiva normal HENT: [place]; AT; mild nasal congestion Neck: supple  CV: RRR Lungs: speaks full sentences without difficulty; unlabored; no wheezing Extremities: no edema Skin: warm and dry Neurologic: normal gait Psychological: alert and cooperative; normal mood and affect  Labs:  Labs Reviewed  SARS CORONAVIRUS 2 (TAT 6-24 HRS)     Allergies  Allergen Reactions  .  Other     Ketchup, causes itching    Past Medical History:  Diagnosis Date  . Asthma   . Eczema   . Hypertension   . Type 2 diabetes mellitus ([place]) 12/2018   Social History   Socioeconomic History  . Marital status: Divorced    Spouse name: Not on file  . Number of children: Not on file  . Years of education: Not on file  . Highest education level: Not on file  Occupational History  . Not on file  Tobacco Use  . Smoking status:  Never Smoker  . Smokeless tobacco: Never Used  Substance and Sexual Activity  . Alcohol use: Yes  . Drug use: Never  . Sexual activity: Not Currently    Partners: Male    Birth control/protection: Surgical  Other Topics Concern  . Not on file  Social History Narrative  . Not on file   Social Determinants of Health   Financial Resource Strain:   . Difficulty of Paying Living Expenses:   Food Insecurity:   . Worried About Charity fundraiser in the Last Year:   . Arboriculturist in the Last Year:   Transportation Needs:   . Film/video editor (Medical):   Marland Kitchen Lack of Transportation (Non-Medical):   Physical Activity:   . Days of Exercise per Week:   . Minutes of Exercise per Session:   Stress:   . Feeling of Stress :   Social Connections:   . Frequency of Communication with Friends and Family:   . Frequency of Social Gatherings with Friends and Family:   . Attends Religious Services:   . Active Member of Clubs or Organizations:   . Attends Archivist Meetings:   Marland Kitchen Marital Status:   Intimate Partner Violence:   . Fear of Current or Ex-Partner:   . Emotionally Abused:   Marland Kitchen Physically Abused:   . Sexually Abused:    Family History  Problem Relation Age of Onset  . Diabetes Mother   . Hypertension Mother   . Diabetes Maternal Grandmother    Past Surgical History:  Procedure Laterality Date  . CESAREAN SECTION  05/25/1997  . CESAREAN SECTION  03/30/2005   twins     Vanessa Kick, MD 07/02/19 1331

## 2019-07-02 NOTE — Discharge Instructions (Addendum)
Keep your follow up appointment with your PCP to discuss your blood pressure. Your blood pressure was noted to be elevated during your visit today. If you are currently taking medication for high blood pressure, please ensure you are taking this as directed. If you do not have a history of high blood pressure and your blood pressure remains persistently elevated, you may need to begin taking a medication at some point.  BP (!) 174/91 (BP Location: Left Arm)   You have been tested for COVID-19 today. If your test returns positive, you will receive a phone call from [facility] regarding your results. Negative test results are not called. Both positive and negative results area always visible on MyChart. If you do not have a MyChart account, sign up instructions are provided in your discharge papers. Please do not hesitate to contact us should you have questions or concerns.

## 2019-07-02 NOTE — ED Triage Notes (Signed)
Pt reports having headaches and body aches x 5 days. Pt reports having pain in her right side of her body x 5 days.

## 2019-07-03 LAB — SARS CORONAVIRUS 2 (TAT 6-24 HRS): SARS Coronavirus 2: NEGATIVE

## 2019-07-08 ENCOUNTER — Telehealth: Payer: Self-pay | Admitting: Gastroenterology

## 2019-07-08 ENCOUNTER — Telehealth: Payer: Self-pay | Admitting: *Deleted

## 2019-07-08 NOTE — Telephone Encounter (Signed)
Saw that patient is r/s to 09/2019.  Thanks.

## 2019-07-08 NOTE — Telephone Encounter (Signed)
Hi Sheri,  This patient was a no show for PV appt today at 10 am.  Called patient and LMOM with no return call.  I was able to cancel her PV appt and Covid Test but I was not able to cancel the Hospital Colonoscopy at [facility] 07/16/19 at 930 am.  Can you see if you can cancel the [facility] colon procedure?  Thank you in advance for your help!  Zola Button, RN PV

## 2019-07-08 NOTE — Telephone Encounter (Signed)
Patient has been rescheduled to 10/07/19 8:00.  She understands she will need to come for a pre-visit and COVID screen

## 2019-07-12 ENCOUNTER — Other Ambulatory Visit (HOSPITAL_COMMUNITY): Payer: Medicaid Other

## 2019-07-17 ENCOUNTER — Ambulatory Visit: Payer: Medicaid Other | Admitting: Allergy

## 2019-08-01 ENCOUNTER — Ambulatory Visit: Payer: Medicaid Other | Admitting: Allergy

## 2019-08-19 ENCOUNTER — Other Ambulatory Visit: Payer: Self-pay

## 2019-08-19 DIAGNOSIS — R4586 Emotional lability: Secondary | ICD-10-CM

## 2019-08-19 DIAGNOSIS — N951 Menopausal and female climacteric states: Secondary | ICD-10-CM

## 2019-08-19 MED ORDER — VENLAFAXINE HCL ER 75 MG PO CP24: 75.0000 mg | ORAL_CAPSULE | Freq: Every day | ORAL | 3 refills | Status: DC

## 2019-08-19 NOTE — Telephone Encounter (Signed)
Refill on venlafaxine er 75 mg sent to [facility] pharmacy

## 2019-08-22 ENCOUNTER — Ambulatory Visit: Payer: Medicaid Other | Admitting: Allergy

## 2019-08-26 ENCOUNTER — Ambulatory Visit: Payer: Medicaid Other | Admitting: Obstetrics & Gynecology

## 2019-09-23 ENCOUNTER — Telehealth: Payer: Self-pay

## 2019-09-23 NOTE — Telephone Encounter (Signed)
Case cancelled.  Thank you

## 2019-10-03 ENCOUNTER — Other Ambulatory Visit (HOSPITAL_COMMUNITY): Payer: Medicaid Other

## 2019-10-07 ENCOUNTER — Ambulatory Visit (HOSPITAL_COMMUNITY): Admission: RE | Admit: 2019-10-07 | Payer: Medicaid Other | Source: Home / Self Care | Admitting: Gastroenterology

## 2019-10-07 ENCOUNTER — Ambulatory Visit: Payer: Medicaid Other | Admitting: Neurology

## 2019-10-07 ENCOUNTER — Telehealth: Payer: Self-pay | Admitting: Neurology

## 2019-10-07 ENCOUNTER — Encounter (HOSPITAL_COMMUNITY): Admission: RE | Payer: Self-pay | Source: Home / Self Care

## 2019-10-07 SURGERY — COLONOSCOPY WITH PROPOFOL
Anesthesia: Monitor Anesthesia Care

## 2019-10-07 NOTE — Telephone Encounter (Signed)
Pt called at 10:57 and left a VM stating she had an apt scheduled 10:30 am. She overslept due to not feeling well. She is asking for a return call to reschedule the appointment.

## 2019-10-16 ENCOUNTER — Other Ambulatory Visit: Payer: Self-pay | Admitting: Allergy

## 2019-10-22 ENCOUNTER — Telehealth: Payer: Self-pay

## 2019-10-22 ENCOUNTER — Other Ambulatory Visit: Payer: Self-pay

## 2019-10-22 DIAGNOSIS — N951 Menopausal and female climacteric states: Secondary | ICD-10-CM

## 2019-10-22 MED ORDER — MEDROXYPROGESTERONE ACETATE 2.5 MG PO TABS: 2.5000 mg | ORAL_TABLET | Freq: Every day | ORAL | 2 refills | Status: DC

## 2019-10-22 NOTE — Telephone Encounter (Signed)
Patient was contacted regarding Mammography scholarship fund application. Patient stated she has full Medicaid coverage. Patient informed Medicaid should cover the mammogram and she can contact the [facility] directly.

## 2019-10-22 NOTE — Progress Notes (Signed)
Ok to refill Rx per Sharen Counter, CNM  Pt has upcoming appt with MD.

## 2019-10-28 ENCOUNTER — Ambulatory Visit: Payer: Medicaid Other | Admitting: Advanced Practice Midwife

## 2019-10-29 ENCOUNTER — Encounter: Payer: Self-pay | Admitting: Family Medicine

## 2019-10-29 ENCOUNTER — Ambulatory Visit: Payer: Medicaid Other | Admitting: Family Medicine

## 2019-10-29 NOTE — Progress Notes (Signed)
Patient did not keep appointment today. She may call to reschedule.  

## 2019-11-29 ENCOUNTER — Ambulatory Visit (HOSPITAL_COMMUNITY)
Admission: EM | Admit: 2019-11-29 | Discharge: 2019-11-29 | Disposition: A | Discharge status: Home / Self Care | Payer: Medicaid Other | Attending: Emergency Medicine | Admitting: Emergency Medicine

## 2019-11-29 ENCOUNTER — Emergency Department (HOSPITAL_COMMUNITY): Payer: Medicaid Other

## 2019-11-29 ENCOUNTER — Encounter (HOSPITAL_COMMUNITY): Payer: Self-pay

## 2019-11-29 ENCOUNTER — Ambulatory Visit (INDEPENDENT_AMBULATORY_CARE_PROVIDER_SITE_OTHER): Payer: Medicaid Other

## 2019-11-29 ENCOUNTER — Other Ambulatory Visit: Payer: Self-pay

## 2019-11-29 ENCOUNTER — Emergency Department (HOSPITAL_COMMUNITY)
Admission: EM | Admit: 2019-11-29 | Discharge: 2019-11-30 | Disposition: A | Discharge status: Home / Self Care | Payer: Medicaid Other | Attending: Emergency Medicine | Admitting: Emergency Medicine

## 2019-11-29 DIAGNOSIS — R079 Chest pain, unspecified: Secondary | ICD-10-CM

## 2019-11-29 DIAGNOSIS — J45909 Unspecified asthma, uncomplicated: Secondary | ICD-10-CM | POA: Insufficient documentation

## 2019-11-29 DIAGNOSIS — E119 Type 2 diabetes mellitus without complications: Secondary | ICD-10-CM | POA: Insufficient documentation

## 2019-11-29 DIAGNOSIS — R05 Cough: Secondary | ICD-10-CM | POA: Diagnosis present

## 2019-11-29 DIAGNOSIS — Z793 Long term (current) use of hormonal contraceptives: Secondary | ICD-10-CM | POA: Insufficient documentation

## 2019-11-29 DIAGNOSIS — R062 Wheezing: Secondary | ICD-10-CM | POA: Diagnosis not present

## 2019-11-29 DIAGNOSIS — R9389 Abnormal findings on diagnostic imaging of other specified body structures: Secondary | ICD-10-CM

## 2019-11-29 DIAGNOSIS — Z6841 Body Mass Index (BMI) 40.0 and over, adult: Secondary | ICD-10-CM | POA: Insufficient documentation

## 2019-11-29 DIAGNOSIS — N938 Other specified abnormal uterine and vaginal bleeding: Secondary | ICD-10-CM | POA: Diagnosis not present

## 2019-11-29 DIAGNOSIS — R0602 Shortness of breath: Secondary | ICD-10-CM | POA: Diagnosis present

## 2019-11-29 DIAGNOSIS — R059 Cough, unspecified: Secondary | ICD-10-CM

## 2019-11-29 DIAGNOSIS — Z7984 Long term (current) use of oral hypoglycemic drugs: Secondary | ICD-10-CM | POA: Insufficient documentation

## 2019-11-29 DIAGNOSIS — Z79899 Other long term (current) drug therapy: Secondary | ICD-10-CM | POA: Insufficient documentation

## 2019-11-29 DIAGNOSIS — D649 Anemia, unspecified: Secondary | ICD-10-CM | POA: Diagnosis not present

## 2019-11-29 DIAGNOSIS — Z20822 Contact with and (suspected) exposure to covid-19: Secondary | ICD-10-CM | POA: Insufficient documentation

## 2019-11-29 DIAGNOSIS — I10 Essential (primary) hypertension: Secondary | ICD-10-CM | POA: Insufficient documentation

## 2019-11-29 DIAGNOSIS — Z7952 Long term (current) use of systemic steroids: Secondary | ICD-10-CM | POA: Insufficient documentation

## 2019-11-29 DIAGNOSIS — R0789 Other chest pain: Secondary | ICD-10-CM | POA: Insufficient documentation

## 2019-11-29 DIAGNOSIS — R42 Dizziness and giddiness: Secondary | ICD-10-CM

## 2019-11-29 LAB — ABO/RH: ABO/RH(D): O POS

## 2019-11-29 LAB — I-STAT BETA HCG BLOOD, ED (MC, WL, AP ONLY): I-stat hCG, quantitative: <5 m[IU]/mL (ref <5)

## 2019-11-29 LAB — TROPONIN I (HIGH SENSITIVITY): Troponin I (High Sensitivity): 8 ng/L (ref <18)

## 2019-11-29 LAB — COMPREHENSIVE METABOLIC PANEL
ALT: 24 U/L (ref 0–44)
AST: 26 U/L (ref 15–41)
Albumin: 3.8 g/dL (ref 3.5–5.0)
Alkaline Phosphatase: 67 U/L (ref 38–126)
Anion gap: 14 (ref 5–15)
BUN: 8 mg/dL (ref 6–20)
CO2: 22 mmol/L (ref 22–32)
Calcium: 9.5 mg/dL (ref 8.9–10.3)
Chloride: 100 mmol/L (ref 98–111)
Creatinine, Ser: 1.1 mg/dL — ABNORMAL HIGH (ref 0.44–1.00)
GFR calc Af Amer: >60 mL/min (ref >60)
GFR calc non Af Amer: 58 mL/min — ABNORMAL LOW (ref >60)
Glucose, Bld: 140 mg/dL — ABNORMAL HIGH (ref 70–99)
Potassium: 3.5 mmol/L (ref 3.5–5.1)
Sodium: 136 mmol/L (ref 135–145)
Total Bilirubin: 0.5 mg/dL (ref 0.3–1.2)
Total Protein: 7.7 g/dL (ref 6.5–8.1)

## 2019-11-29 LAB — PREPARE RBC (CROSSMATCH)

## 2019-11-29 LAB — SARS CORONAVIRUS 2 (TAT 6-24 HRS): SARS Coronavirus 2: NEGATIVE

## 2019-11-29 LAB — SARS CORONAVIRUS 2 BY RT PCR (HOSPITAL ORDER, PERFORMED IN ~~LOC~~ HOSPITAL LAB): SARS Coronavirus 2: NEGATIVE

## 2019-11-29 MED ORDER — SODIUM CHLORIDE 0.9% FLUSH: 10.0000 mL | Freq: Two times a day (BID) | INTRAVENOUS | Status: DC

## 2019-11-29 MED ORDER — IOHEXOL 300 MG/ML  SOLN
75.0000 mL | Freq: Once | INTRAMUSCULAR | Status: AC | PRN
  Administered 2019-11-29: 75 mL via INTRAVENOUS

## 2019-11-29 MED ORDER — MEGESTROL ACETATE 40 MG PO TABS
120.0000 mg | ORAL_TABLET | Freq: Once | ORAL | Status: AC
  Administered 2019-11-29: 120 mg via ORAL
  Filled 2019-11-29: qty 3

## 2019-11-29 MED ORDER — SODIUM CHLORIDE 0.9% IV SOLUTION: Freq: Once | INTRAVENOUS | Status: DC

## 2019-11-29 MED ORDER — MEGESTROL ACETATE 40 MG PO TABS: 120.0000 mg | ORAL_TABLET | Freq: Every day | ORAL | 0 refills | Status: DC

## 2019-11-29 MED ORDER — SODIUM CHLORIDE 0.9% FLUSH: 10.0000 mL | INTRAVENOUS | Status: DC | PRN

## 2019-11-29 MED ORDER — FERROUS SULFATE 325 (65 FE) MG PO TABS: 325.0000 mg | ORAL_TABLET | Freq: Every day | ORAL | 0 refills | Status: DC

## 2019-11-29 NOTE — ED Triage Notes (Signed)
Stated was seen at Urgent Care for shortness of breath and have an abnormal XRAY. Stated she was instructed to come to ED for a CT.

## 2019-11-29 NOTE — ED Notes (Signed)
Pt returned from ct

## 2019-11-29 NOTE — Discharge Instructions (Signed)
Unfortunately your xray is abnormal, with some concerning findings that may require further imaging to evaluate.  Based on these findings I recommend further evaluation in the ER at this time.

## 2019-11-29 NOTE — ED Provider Notes (Signed)
[facility] EMERGENCY DEPARTMENT Provider Note   [name]: [id number] Arrival date & time: 11/29/19  6759     History Chief Complaint  Patient presents with  . Shortness of Breath    Deborah Mcclain is a 49 y.o. female.  Pt presents to the ED today with cp and sob.  Pt initially went to urgent care.  CXR showed possible pneumomediastinum, so she was sent here for CT chest.  Pt also reports 2 months of heavy vaginal bleeding.  Pt had an gyn appt on 8/17, but missed the appt.  She has another appointment next week.         Past Medical History:  Diagnosis Date  . Asthma   . Eczema   . Hypertension   . Type 2 diabetes mellitus (HCC) 12/2018    Patient Active Problem List   Diagnosis Date Noted  . Essential hypertension 04/16/2019  . Diabetes mellitus type 2 in obese (HCC) 04/16/2019  . Morbid obesity with BMI of 50.0-59.9, adult (HCC) 04/16/2019  . Menopausal vasomotor syndrome 04/16/2019    Past Surgical History:  Procedure Laterality Date  . CESAREAN SECTION  05/25/1997  . CESAREAN SECTION  03/30/2005   twins     OB History    Gravida  2   Para  2   Term  1   Preterm  1   AB      Living  3     SAB      TAB      Ectopic      Multiple  1   Live Births  3           Family History  Problem Relation Age of Onset  . Diabetes Mother   . Hypertension Mother   . Diabetes Maternal Grandmother     Social History   Tobacco Use  . Smoking status: Never Smoker  . Smokeless tobacco: Never Used  Vaping Use  . Vaping Use: Never used  Substance Use Topics  . Alcohol use: Yes  . Drug use: Never    Home Medications Prior to Admission medications   Medication Sig Start Date End Date Taking? Authorizing Provider  albuterol (VENTOLIN HFA) 108 (90 Base) MCG/ACT inhaler INHALE 1 TO 2 PUFFS BY MOUTH EVERY 4 HOURS AS NEEDED 07/01/19   [provider]  azelastine (ASTELIN) 0.1 % nasal spray Place 2 sprays into both nostrils 2  (two) times daily. Patient not taking: Reported on 06/26/2019 04/17/19   Marcelyn Bruins, MD  cetirizine (ZYRTEC) 10 MG tablet Take 1 tablet (10 mg total) by mouth daily. 01/16/18   Dahlia Byes A, NP  cyclobenzaprine (FLEXERIL) 10 MG tablet Take 1 tablet by mouth 3 times daily as needed for muscle spasm. Warning: May cause drowsiness. 07/02/19   Mardella Layman, MD  estradiol (ESTRACE) 1 MG tablet Take 1 tablet (1 mg total) by mouth daily. Patient not taking: Reported on 07/05/2019 06/26/19   Sharen Counter A, CNM  famotidine (PEPCID) 20 MG tablet Take 20 mg by mouth daily.    [provider]  ferrous sulfate 325 (65 FE) MG tablet Take 1 tablet (325 mg total) by mouth daily. 11/29/19   Jacalyn Lefevre, MD  fluticasone (FLONASE) 50 MCG/ACT nasal spray Place 1 spray into both nostrils daily. 01/16/18   Dahlia Byes A, NP  gabapentin (NEURONTIN) 100 MG capsule Take 100 mg by mouth 3 (three) times daily as needed (pain).     [provider]  hydrochlorothiazide (HYDRODIURIL) 25 MG tablet Take 25 mg by mouth daily. 10/08/19   [provider]  levocetirizine (XYZAL) 5 MG tablet TAKE 1 TABLET BY MOUTH ONCE DAILY IN THE EVENING 10/16/19   Padgett, Pilar Grammes, MD  loratadine (CLARITIN) 10 MG tablet Take 10 mg by mouth daily.    [provider]  medroxyPROGESTERone (PROVERA) 2.5 MG tablet Take 1 tablet (2.5 mg total) by mouth daily. 10/22/19   Leftwich-Kirby, Wilmer Floor, CNM  megestrol (MEGACE) 40 MG tablet Take 3 tablets (120 mg total) by mouth daily. 11/29/19   Jacalyn Lefevre, MD  metFORMIN (GLUCOPHAGE) 1000 MG tablet Take 1,000 mg by mouth daily with breakfast.     [provider]  metoprolol tartrate (LOPRESSOR) 50 MG tablet Take 50 mg by mouth 2 (two) times daily.    [provider]  predniSONE (STERAPRED UNI-PAK 21 TAB) 10 MG (21) TBPK tablet Take by mouth daily. Take as directed. 07/02/19   Mardella Layman, MD  venlafaxine XR (EFFEXOR-XR) 75 MG 24  hr capsule Take 1 capsule (75 mg total) by mouth daily. 08/19/19   Hermina Staggers, MD    Allergies    Other  Review of Systems   Review of Systems  Respiratory: Positive for shortness of breath.   Cardiovascular: Positive for chest pain.  Genitourinary: Positive for vaginal bleeding.  All other systems reviewed and are negative.   Physical Exam Updated Vital Signs BP (!) 163/85   Pulse (!) 107   Temp 99 F (37.2 C)   Resp (!) 22   Ht 5\' 8"  (1.727 m)   Wt 136.1 kg   LMP 11/29/2019   SpO2 100%   BMI 45.61 kg/m   Physical Exam Vitals and nursing note reviewed.  Constitutional:      Appearance: She is well-developed. She is obese.  HENT:     Head: Normocephalic and atraumatic.     Mouth/Throat:     Mouth: Mucous membranes are moist.     Pharynx: Oropharynx is clear.  Eyes:     Extraocular Movements: Extraocular movements intact.     Pupils: Pupils are equal, round, and reactive to light.  Cardiovascular:     Rate and Rhythm: Normal rate and regular rhythm.  Pulmonary:     Effort: Pulmonary effort is normal.     Breath sounds: Normal breath sounds.  Abdominal:     General: Bowel sounds are normal.     Palpations: Abdomen is soft.  Musculoskeletal:        General: Normal range of motion.     Cervical back: Normal range of motion and neck supple.  Skin:    General: Skin is warm.     Capillary Refill: Capillary refill takes less than 2 seconds.  Neurological:     General: No focal deficit present.     Mental Status: She is alert and oriented to person, place, and time.  Psychiatric:        Mood and Affect: Mood normal.        Behavior: Behavior normal.     ED Results / Procedures / Treatments   Labs (all labs ordered are listed, but only abnormal results are displayed) Labs Reviewed  CBC WITH DIFFERENTIAL/PLATELET - Abnormal; Notable for the following components:      Result Value   WBC 11.0 (*)    RBC 2.78 (*)    Hemoglobin 6.6 (*)    HCT 23.2 (*)     MCH 23.7 (*)  MCHC 28.4 (*)    Platelets 512 (*)    Neutro Abs 8.0 (*)    All other components within normal limits  COMPREHENSIVE METABOLIC PANEL - Abnormal; Notable for the following components:   Glucose, Bld 140 (*)    Creatinine, Ser 1.10 (*)    GFR calc non Af Amer 58 (*)    All other components within normal limits  SARS CORONAVIRUS 2 BY RT PCR (HOSPITAL ORDER, PERFORMED IN [facility])  I-STAT BETA HCG BLOOD, ED ([facility], [facility], [facility] ONLY)  PREPARE RBC (CROSSMATCH)  TYPE AND SCREEN  ABO/RH  TROPONIN I (HIGH SENSITIVITY)  TROPONIN I (HIGH SENSITIVITY)    EKG None  Radiology DG Chest 2 View  Result Date: 11/29/2019 CLINICAL DATA:  Chest pain, cough, no wheezing EXAM: CHEST - 2 VIEW COMPARISON:  None. FINDINGS: Some interstitial and hazy opacities towards the lung bases possibly related to atelectasis/low lung volumes and body habitus. No focal consolidation. No pneumothorax or effusion. There is suspicious paramediastinal lucency which could reflect some pneumomediastinum. Remaining cardiomediastinal contours are unremarkable. No acute osseous or soft tissue abnormality. IMPRESSION: 1. Findings suspicious for pneumomediastinum.  Consider CT imaging. 2. Some interstitial and hazy opacities towards the lung bases possibly related to atelectasis/low lung volumes and body habitus though atypical infection could present similarly. Electronically Signed   By: Kreg Shropshire M.D.   On: 11/29/2019 15:10   CT Chest W Contrast  Result Date: 11/29/2019 CLINICAL DATA:  Chest pain with mediastinal widening seen on chest plain film. EXAM: CT CHEST WITH CONTRAST TECHNIQUE: Multidetector CT imaging of the chest was performed during intravenous contrast administration. CONTRAST:  6mL OMNIPAQUE IOHEXOL 300 MG/ML  SOLN COMPARISON:  None. FINDINGS: Cardiovascular: No significant vascular findings. Normal heart size. No pericardial effusion. Mediastinum/Nodes: No enlarged mediastinal, hilar, or  axillary lymph nodes. Thyroid gland, trachea, and esophagus demonstrate no significant findings. Lungs/Pleura: Very mild atelectasis and/or early infiltrate is seen within the posterior aspect of the bilateral lung bases. There is no evidence of a pleural effusion or pneumothorax. Upper Abdomen: There is diffuse fatty infiltration of the liver parenchyma. Musculoskeletal: No chest wall abnormality. No acute or significant osseous findings. IMPRESSION: 1. Very mild bibasilar atelectasis and/or early infiltrate. 2. Diffuse fatty infiltration of the liver parenchyma. Electronically Signed   By: Aram Candela M.D.   On: 11/29/2019 22:14    Procedures Procedures (including critical care time)  Medications Ordered in ED Medications  0.9 %  sodium chloride infusion (Manually program via Guardrails IV Fluids) (has no administration in time range)  sodium chloride flush (NS) 0.9 % injection 10-40 mL (has no administration in time range)  sodium chloride flush (NS) 0.9 % injection 10-40 mL (has no administration in time range)  megestrol (MEGACE) tablet 120 mg (120 mg Oral Given 11/29/19 2104)  iohexol (OMNIPAQUE) 300 MG/ML solution 75 mL (75 mLs Intravenous Contrast Given 11/29/19 2028)  iohexol (OMNIPAQUE) 300 MG/ML solution 75 mL (75 mLs Intravenous Contrast Given 11/29/19 2156)    ED Course  I have reviewed the triage vital signs and the nursing notes.  Pertinent labs & imaging results that were available during my care of the patient were reviewed by me and considered in my medical decision making (see chart for details).    MDM Rules/Calculators/A&P                         Pt d/w Dr. Despina Hidden (Gyn).  He recommends 120 mg  megesterol daily for 1 month.  Due to symptomatic anemia, pt will be transfused 2 units and put on iron.  CT chest shows no evidence of pneumomediastinum.  Pt clinically has no pneumonia sx.   Pt will be able to go home after blood administered.  CRITICAL CARE Performed by:  Jacalyn LefevreJulie Lawarence Meek   Total critical care time: 30 minutes  Critical care time was exclusive of separately billable procedures and treating other patients.  Critical care was necessary to treat or prevent imminent or life-threatening deterioration.  Critical care was time spent personally by me on the following activities: development of treatment plan with patient and/or surrogate as well as nursing, discussions with consultants, evaluation of patient's response to treatment, examination of patient, obtaining history from patient or surrogate, ordering and performing treatments and interventions, ordering and review of laboratory studies, ordering and review of radiographic studies, pulse oximetry and re-evaluation of patient's condition.  Final Clinical Impression(s) / ED Diagnoses Final diagnoses:  Symptomatic anemia  DUB (dysfunctional uterine bleeding)  Atypical chest pain    Rx / DC Orders ED Discharge Orders         Ordered    ferrous sulfate 325 (65 FE) MG tablet  Daily        11/29/19 2243    megestrol (MEGACE) 40 MG tablet  Daily        11/29/19 2243           Jacalyn LefevreHaviland, Munira Polson, MD 11/29/19 2244

## 2019-11-29 NOTE — ED Notes (Signed)
Pt is c/o vaginal bleeding for 12 months   Some abd cramping  hbb low  alos c;o intermittent  Feet and legs cramping

## 2019-11-29 NOTE — ED Triage Notes (Signed)
Pt c/o non-productive cough, runny nose, HA, body aches onset approx 5 days. Reports mid-sternal CP characterized as heaviness 8/10 scale, SOB, diaphoresis, nausea, dizziness onset two days ago. Pt states while she was in the store, she experienced nausea, diaphoresis, dizziness and CP simultaneously.  Denies extremity weakness, slurred speech, changes in vision. Pt reports CP increases with palpation, inspiration, movement. Right lower lobe slightly diminished, otherwise CTA lung sounds.  EKG performed and results given to T. Jaci Lazier who advised pt can be further evaluated here based on time of arrival.

## 2019-11-29 NOTE — ED Provider Notes (Signed)
[facility]    [name]: [id number] Arrival date & time: 11/29/19  1031      History   Chief Complaint Chief Complaint  Patient presents with   Chest Pain    HPI Deborah Mcclain is a 49 y.o. female.   [name] presents with complaints of feeling nausea, dizzy, chest tight, headache, cough, some nasal drainage. No fevers. Body aches. She has been menstruating for the past 2.5 months, she sees gynecology next week. Nausea and dizziness started two days ago. Not worse but not better. No vision changes. If she gets up too fast it worsens the dizziness. Sometimes short of breath. Cough is dry. Has taken tylenol. Laying down helps the dizziness. No known ill contacts. Doesn't smoke. History of asthma. No wheezing. Not currently employed. Vaccinated for covid. No vomiting or diarrhea.    ROS per HPI, negative if not otherwise mentioned.      Past Medical History:  Diagnosis Date   Asthma    Eczema    Hypertension    Type 2 diabetes mellitus (HCC) 12/2018    Patient Active Problem List   Diagnosis Date Noted   Essential hypertension 04/16/2019   Diabetes mellitus type 2 in obese (HCC) 04/16/2019   Morbid obesity with BMI of 50.0-59.9, adult (HCC) 04/16/2019   Menopausal vasomotor syndrome 04/16/2019    Past Surgical History:  Procedure Laterality Date   CESAREAN SECTION  05/25/1997   CESAREAN SECTION  03/30/2005   twins    OB History    Gravida  2   Para  2   Term  1   Preterm  1   AB      Living  3     SAB      TAB      Ectopic      Multiple  1   Live Births  3            Home Medications    Prior to Admission medications   Medication Sig Start Date End Date Taking? Authorizing Provider  albuterol (VENTOLIN HFA) 108 (90 Base) MCG/ACT inhaler INHALE 1 TO 2 PUFFS BY MOUTH EVERY 4 HOURS AS NEEDED 07/01/19  Yes [provider]  cetirizine (ZYRTEC) 10 MG tablet Take 1 tablet (10 mg total) by mouth daily. 01/16/18   Yes Bast, Traci A, NP  famotidine (PEPCID) 20 MG tablet Take 20 mg by mouth daily.   Yes [provider]  gabapentin (NEURONTIN) 100 MG capsule Take 100 mg by mouth 3 (three) times daily as needed (pain).    Yes [provider]  hydrochlorothiazide (HYDRODIURIL) 25 MG tablet Take 25 mg by mouth daily. 10/08/19  Yes [provider]  metFORMIN (GLUCOPHAGE) 1000 MG tablet Take 1,000 mg by mouth daily with breakfast.    Yes [provider]  metoprolol tartrate (LOPRESSOR) 50 MG tablet Take 50 mg by mouth 2 (two) times daily.   Yes [provider]  venlafaxine XR (EFFEXOR-XR) 75 MG 24 hr capsule Take 1 capsule (75 mg total) by mouth daily. 08/19/19  Yes Hermina Staggers, MD  azelastine (ASTELIN) 0.1 % nasal spray Place 2 sprays into both nostrils 2 (two) times daily. Patient not taking: Reported on 06/26/2019 04/17/19   Marcelyn Bruins, MD  cyclobenzaprine (FLEXERIL) 10 MG tablet Take 1 tablet by mouth 3 times daily as needed for muscle spasm. Warning: May cause drowsiness. 07/02/19   Mardella Layman, MD  estradiol (ESTRACE) 1 MG tablet Take 1 tablet (  1 mg total) by mouth daily. Patient not taking: Reported on 07/05/2019 06/26/19   Leftwich-Kirby, Wilmer Floor, CNM  fluticasone (FLONASE) 50 MCG/ACT nasal spray Place 1 spray into both nostrils daily. 01/16/18   Dahlia Byes A, NP  levocetirizine (XYZAL) 5 MG tablet TAKE 1 TABLET BY MOUTH ONCE DAILY IN THE EVENING 10/16/19   Padgett, Pilar Grammes, MD  loratadine (CLARITIN) 10 MG tablet Take 10 mg by mouth daily.    [provider]  medroxyPROGESTERone (PROVERA) 2.5 MG tablet Take 1 tablet (2.5 mg total) by mouth daily. 10/22/19   Leftwich-Kirby, Wilmer Floor, CNM  predniSONE (STERAPRED UNI-PAK 21 TAB) 10 MG (21) TBPK tablet Take by mouth daily. Take as directed. 07/02/19   Mardella Layman, MD    Family History Family History  Problem Relation Age of Onset   Diabetes Mother    Hypertension Mother    Diabetes  Maternal Grandmother     Social History Social History   Tobacco Use   Smoking status: Never Smoker   Smokeless tobacco: Never Used  Building services engineer Use: Never used  Substance Use Topics   Alcohol use: Yes   Drug use: Never     Allergies   Other   Review of Systems Review of Systems   Physical Exam Triage Vital Signs ED Triage Vitals  Enc Vitals Group     BP 11/29/19 1041 (!) 150/84     Pulse Rate 11/29/19 1041 98     Resp 11/29/19 1041 20     Temp 11/29/19 1041 98.4 F (36.9 C)     Temp Source 11/29/19 1041 Oral     SpO2 11/29/19 1041 100 %     Weight --      Height --      Head Circumference --      Peak Flow --      Pain Score 11/29/19 1054 8     Pain Loc --      Pain Edu? --      Excl. in GC? --    No data found.  Updated Vital Signs BP (!) 150/84 (BP Location: Right Wrist)    Pulse 98    Temp 98.4 F (36.9 C) (Oral)    Resp 20    LMP 11/29/2019    SpO2 100%   Visual Acuity Right Eye Distance:   Left Eye Distance:   Bilateral Distance:    Right Eye Near:   Left Eye Near:    Bilateral Near:     Physical Exam Constitutional:      General: She is not in acute distress.    Appearance: She is well-developed.  Cardiovascular:     Rate and Rhythm: Normal rate.  Pulmonary:     Effort: Pulmonary effort is normal.     Breath sounds: Decreased breath sounds present.  Skin:    General: Skin is warm and dry.  Neurological:     Mental Status: She is alert and oriented to person, place, and time.    EKG:  NSR . Previous EKG was not available for review. No stwave changes as interpreted by me.    UC Treatments / Results  Labs (all labs ordered are listed, but only abnormal results are displayed) Labs Reviewed  SARS CORONAVIRUS 2 (TAT 6-24 HRS)  CBC WITH DIFFERENTIAL/PLATELET  BASIC METABOLIC PANEL    EKG   Radiology DG Chest 2 View  Result Date: 11/29/2019 CLINICAL DATA:  Chest pain, cough, no wheezing EXAM: CHEST -  2 VIEW  COMPARISON:  None. FINDINGS: Some interstitial and hazy opacities towards the lung bases possibly related to atelectasis/low lung volumes and body habitus. No focal consolidation. No pneumothorax or effusion. There is suspicious paramediastinal lucency which could reflect some pneumomediastinum. Remaining cardiomediastinal contours are unremarkable. No acute osseous or soft tissue abnormality. IMPRESSION: 1. Findings suspicious for pneumomediastinum.  Consider CT imaging. 2. Some interstitial and hazy opacities towards the lung bases possibly related to atelectasis/low lung volumes and body habitus though atypical infection could present similarly. Electronically Signed   By: Kreg Shropshire M.D.   On: 11/29/2019 15:10    Procedures Procedures (including critical care time)  Medications Ordered in UC Medications - No data to display  Initial Impression / Assessment and Plan / UC Course  I have reviewed the triage vital signs and the nursing notes.  Pertinent labs & imaging results that were available during my care of the patient were reviewed by me and considered in my medical decision making (see chart for details).     No work of breathing. No hypoxia, tachypnea or tachycardia. Chest pain , dizziness. xray with suspicion for pneumomediastinum, as well as atypical infection. With pain and dizziness recommend further eval in the ER now. Patient family member will transport her there now. Patient verbalized understanding and agreeable to plan.   Final Clinical Impressions(s) / UC Diagnoses   Final diagnoses:  Abnormal chest x-ray  Cough  Chest pain, unspecified type  Dizziness     Discharge Instructions     Unfortunately your xray is abnormal, with some concerning findings that may require further imaging to evaluate.  Based on these findings I recommend further evaluation in the ER at this time.     ED Prescriptions    None     PDMP not reviewed this encounter.   Georgetta Haber, NP 11/29/19 1536

## 2019-11-29 NOTE — ED Triage Notes (Signed)
Emergency Medicine Provider Triage Evaluation Note  Deborah Mcclain , a 49 y.o. female  was evaluated in triage.  Pt complains of shortness of breath and chest pain.  Symptoms began several days ago.  She reports shortness of breath and central chest discomfort.  Worse with inspiration.  She has associated dizziness and nausea, no vomiting.  She also reports vaginal bleeding for 2 months.  She was seen at urgent care, had an abnormal x-ray and sent to the ER for further evaluation.  She denies fevers, chills, cough, abdominal pain.  Review of Systems  Positive: Cp, sob, nausea, dizziness, vaginal bleeding Negative: Fever, vomiting  Physical Exam  BP (!) 166/82 (BP Location: Left Arm)   Pulse (!) 107   Temp 99 F (37.2 C) (Oral)   Resp 16   Ht 5\' 8"  (1.727 m)   Wt 136.1 kg   LMP 11/29/2019   SpO2 100%   BMI 45.61 kg/m  Gen:   Awake, no distress   HEENT:  Atraumatic  Resp:  Normal effort, ctab Cardiac:  Normal rate, RRR Abd:   Nondistended, nontender MSK:   Moves extremities without difficulty  Neuro:  Speech clear   Medical Decision Making  Medically screening exam initiated at 4:29 PM.  Appropriate orders placed.  Deborah Mcclain was informed that the remainder of the evaluation will be completed by another provider, this initial triage assessment does not replace that evaluation, and the importance of remaining in the ED until their evaluation is complete.  Clinical Impression   Patient with chest pain, shortness breath, nausea, dizziness.  X-ray obtained from urgent care showed concern for pneumomediastinum and abnormal infection.  As such, will obtain labs and CT chest.  Will obtain Covid test for assessment of abnormal infection.    Prudencio Pair, PA-C 11/29/19 1631

## 2019-11-29 NOTE — ED Notes (Signed)
Unable to start blood transfusion until the  c-t has been done  Iv team restarted her iv and they will take her back soon for the c-t

## 2019-11-29 NOTE — ED Notes (Signed)
Pt returned from  c-t iv blew  ic team consult

## 2019-11-30 LAB — CBC WITH DIFFERENTIAL/PLATELET
Abs Immature Granulocytes: 0.06 10*3/uL (ref 0.00–0.07)
Basophils Absolute: 0 10*3/uL (ref 0.0–0.1)
Basophils Relative: 0 %
Eosinophils Absolute: 0.2 10*3/uL (ref 0.0–0.5)
Eosinophils Relative: 2 %
HCT: 23.2 % — ABNORMAL LOW (ref 36.0–46.0)
Hemoglobin: 6.6 g/dL — CL (ref 12.0–15.0)
Immature Granulocytes: 1 %
Lymphocytes Relative: 19 %
Lymphs Abs: 2.1 10*3/uL (ref 0.7–4.0)
MCH: 23.7 pg — ABNORMAL LOW (ref 26.0–34.0)
MCHC: 28.4 g/dL — ABNORMAL LOW (ref 30.0–36.0)
MCV: 83.5 fL (ref 80.0–100.0)
Monocytes Absolute: 0.6 10*3/uL (ref 0.1–1.0)
Monocytes Relative: 6 %
Neutro Abs: 8 10*3/uL — ABNORMAL HIGH (ref 1.7–7.7)
Neutrophils Relative %: 72 %
Platelets: 512 10*3/uL — ABNORMAL HIGH (ref 150–400)
RBC: 2.78 MIL/uL — ABNORMAL LOW (ref 3.87–5.11)
RDW: 14.9 % (ref 11.5–15.5)
WBC: 11 10*3/uL — ABNORMAL HIGH (ref 4.0–10.5)
nRBC: 0.2 % (ref 0.0–0.2)

## 2019-11-30 NOTE — ED Notes (Signed)
2nd blood unit started no reaction to the first unit

## 2019-12-01 LAB — TYPE AND SCREEN
ABO/RH(D): O POS
Antibody Screen: NEGATIVE
Unit division: 0
Unit division: 0

## 2019-12-01 LAB — BPAM RBC
Blood Product Expiration Date: 202110102359
Blood Product Expiration Date: 202110142359
ISSUE DATE / TIME: 202109172217
ISSUE DATE / TIME: 202109180100
Unit Type and Rh: 5100
Unit Type and Rh: 5100

## 2019-12-03 ENCOUNTER — Other Ambulatory Visit (HOSPITAL_COMMUNITY)
Admission: RE | Admit: 2019-12-03 | Discharge: 2019-12-03 | Disposition: A | Discharge status: Home / Self Care | Payer: Medicaid Other | Source: Ambulatory Visit | Attending: Obstetrics and Gynecology | Admitting: Obstetrics and Gynecology

## 2019-12-03 ENCOUNTER — Encounter: Payer: Self-pay | Admitting: Obstetrics and Gynecology

## 2019-12-03 ENCOUNTER — Other Ambulatory Visit: Payer: Self-pay

## 2019-12-03 ENCOUNTER — Ambulatory Visit: Payer: Medicaid Other | Admitting: Obstetrics and Gynecology

## 2019-12-03 VITALS — BP 149/85 | HR 107 | Ht 64.0 in | Wt 299.0 lb

## 2019-12-03 DIAGNOSIS — N939 Abnormal uterine and vaginal bleeding, unspecified: Secondary | ICD-10-CM

## 2019-12-03 DIAGNOSIS — R519 Headache, unspecified: Secondary | ICD-10-CM

## 2019-12-03 DIAGNOSIS — Z9289 Personal history of other medical treatment: Secondary | ICD-10-CM | POA: Insufficient documentation

## 2019-12-03 DIAGNOSIS — D649 Anemia, unspecified: Secondary | ICD-10-CM | POA: Insufficient documentation

## 2019-12-03 DIAGNOSIS — R42 Dizziness and giddiness: Secondary | ICD-10-CM | POA: Diagnosis not present

## 2019-12-03 HISTORY — PX: ENDOMETRIAL BIOPSY: PRO73

## 2019-12-03 NOTE — Procedures (Signed)
Endometrial Biopsy Procedure Note  Pre-operative Diagnosis: AUB  Post-operative Diagnosis: same  Procedure Details  The risks (including infection, bleeding, pain, and uterine perforation) and benefits of the procedure were explained to the patient and Written informed consent was obtained.  The patient was placed in the dorsal lithotomy position.  Bimanual exam showed the uterus to be in the neutral position.  A Graves' speculum inserted in the vagina, and the cervix was visualized. The cervix was then prepped with povidone iodine, and a sharp tenaculum was applied to the anterior lip of the cervix for stabilization.  A pipelle was inserted into the uterine cavity and sounded the uterus to a depth of 10.5cm.  A Small amount of tissue was collected after 2 passes. The sample was sent for pathologic examination.  Condition: Stable  Complications: None  Plan: The patient was advised to call for any fever or for prolonged or severe pain or bleeding. She was advised to use OTC analgesics as needed for mild to moderate pain. She was advised to avoid vaginal intercourse for 48 hours or until the bleeding has completely stopped.  Cornelia Copa MD Attending Center for [employer] (Faculty Practice) '

## 2019-12-03 NOTE — Progress Notes (Signed)
Obstetrics and Gynecology Established Patient Evaluation  Appointment Date: 12/03/2019  OBGYN Clinic: [facility]  Primary Care Provider: Laruth Bouchard  Referring Provider: [facility]  Chief Complaint: f/u ED Visit History of Present Illness: Deborah Mcclain is a 49 y.o. [phone] (Patient's last menstrual period was 10/04/2019 (exact date).), seen for the above chief complaint. Her past medical history is significant for BMI 50, HTN, DM2, h/o c-section x 2.  Patient went to ED on 9/17 for SOB. She also noted VB that had been going on for the past two months. She was anemic there with Hgb 6.6; covid neg. She was transfused two units. On call GYN recommended megace.  She states she is just having scant d/c and confirms the megace 120mg  po qday, as in the chart. She notes some dizziness and HA.  Review of Systems: Pertinent items noted in HPI and remainder of comprehensive ROS otherwise negative.    Patient Active Problem List   Diagnosis Date Noted  . Anemia 12/03/2019  . Abnormal uterine bleeding (AUB) 12/03/2019  . History of blood transfusion 12/03/2019  . Essential hypertension 04/16/2019  . Diabetes mellitus type 2 in obese (HCC) 04/16/2019  . Morbid obesity with BMI of 50.0-59.9, adult (HCC) 04/16/2019  . Menopausal vasomotor syndrome 04/16/2019    Past Medical History:  Past Medical History:  Diagnosis Date  . Asthma   . Eczema   . Hypertension   . Type 2 diabetes mellitus (HCC) 12/2018    Past Surgical History:  Past Surgical History:  Procedure Laterality Date  . CESAREAN SECTION  05/25/1997  . CESAREAN SECTION  03/30/2005   twins    Past Obstetrical History:  OB History  Gravida Para Term Preterm AB Living  2 2 1 1   3   SAB TAB Ectopic Multiple Live Births        1 3    # Outcome Date GA Lbr Len/2nd Weight Sex Delivery Anes PTL Lv  2A Preterm 03/30/05    F CS-LTranv   LIV  2B Preterm 03/30/05    M CS-LTranv   LIV  1 Term  05/25/97    04/01/05   LIV    Past Gynecological History: As per HPI. History of Pap Smear(s): Yes.   Last pap 2021, which was negative  Social History:  Social History   Socioeconomic History  . Marital status: Divorced    Spouse name: Not on file  . Number of children: Not on file  . Years of education: Not on file  . Highest education level: Not on file  Occupational History  . Not on file  Tobacco Use  . Smoking status: Never Smoker  . Smokeless tobacco: Never Used  Vaping Use  . Vaping Use: Never used  Substance and Sexual Activity  . Alcohol use: Yes  . Drug use: Never  . Sexual activity: Not Currently    Partners: Male    Birth control/protection: Surgical  Other Topics Concern  . Not on file  Social History Narrative  . Not on file   Social Determinants of Health   Financial Resource Strain:   . Difficulty of Paying Living Expenses: Not on file  Food Insecurity:   . Worried About Wandalee Ferdinand in the Last Year: Not on file  . Ran Out of Food in the Last Year: Not on file  Transportation Needs:   . Lack of Transportation (Medical): Not on file  . Lack of Transportation (Non-Medical): Not  on file  Physical Activity:   . Days of Exercise per Week: Not on file  . Minutes of Exercise per Session: Not on file  Stress:   . Feeling of Stress : Not on file  Social Connections:   . Frequency of Communication with Friends and Family: Not on file  . Frequency of Social Gatherings with Friends and Family: Not on file  . Attends Religious Services: Not on file  . Active Member of Clubs or Organizations: Not on file  . Attends Banker Meetings: Not on file  . Marital Status: Not on file  Intimate Partner Violence:   . Fear of Current or Ex-Partner: Not on file  . Emotionally Abused: Not on file  . Physically Abused: Not on file  . Sexually Abused: Not on file    Family History:  Family History  Problem Relation Age of Onset  . Diabetes  Mother   . Hypertension Mother   . Diabetes Maternal Grandmother     Medications Daja Shuping had no medications administered during this visit. Current Outpatient Medications  Medication Sig Dispense Refill  . albuterol (VENTOLIN HFA) 108 (90 Base) MCG/ACT inhaler INHALE 1 TO 2 PUFFS BY MOUTH EVERY 4 HOURS AS NEEDED    . cetirizine (ZYRTEC) 10 MG tablet Take 1 tablet (10 mg total) by mouth daily. 30 tablet 0  . hydrochlorothiazide (HYDRODIURIL) 25 MG tablet Take 25 mg by mouth daily.    Marland Kitchen loratadine (CLARITIN) 10 MG tablet Take 10 mg by mouth daily.    . megestrol (MEGACE) 40 MG tablet Take 3 tablets (120 mg total) by mouth daily. 90 tablet 0  . metFORMIN (GLUCOPHAGE) 1000 MG tablet Take 1,000 mg by mouth daily with breakfast.     . metoprolol tartrate (LOPRESSOR) 50 MG tablet Take 50 mg by mouth 2 (two) times daily.    Marland Kitchen venlafaxine XR (EFFEXOR-XR) 75 MG 24 hr capsule Take 1 capsule (75 mg total) by mouth daily. 30 capsule 3  . azelastine (ASTELIN) 0.1 % nasal spray Place 2 sprays into both nostrils 2 (two) times daily. (Patient not taking: Reported on 06/26/2019) 30 mL 5  . cyclobenzaprine (FLEXERIL) 10 MG tablet Take 1 tablet by mouth 3 times daily as needed for muscle spasm. Warning: May cause drowsiness. (Patient not taking: Reported on 12/03/2019) 21 tablet 0  . famotidine (PEPCID) 20 MG tablet Take 20 mg by mouth daily.    . ferrous sulfate 325 (65 FE) MG tablet Take 1 tablet (325 mg total) by mouth daily. (Patient not taking: Reported on 12/03/2019) 30 tablet 0  . fluticasone (FLONASE) 50 MCG/ACT nasal spray Place 1 spray into both nostrils daily. (Patient not taking: Reported on 12/03/2019) 16 g 2  . gabapentin (NEURONTIN) 100 MG capsule Take 100 mg by mouth 3 (three) times daily as needed (pain).  (Patient not taking: Reported on 12/03/2019)    . levocetirizine (XYZAL) 5 MG tablet TAKE 1 TABLET BY MOUTH ONCE DAILY IN THE EVENING (Patient not taking: Reported on 12/03/2019) 30 tablet 0   . predniSONE (STERAPRED UNI-PAK 21 TAB) 10 MG (21) TBPK tablet Take by mouth daily. Take as directed. (Patient not taking: Reported on 12/03/2019) 21 tablet 0   No current facility-administered medications for this visit.    Allergies Other   Physical Exam:  BP (!) 149/85   Pulse (!) 107   Ht 5\' 4"  (1.626 m)   Wt 299 lb (135.6 kg)   LMP 10/04/2019 (Exact Date) Comment: 10/04/19-11/30/2019  BMI 51.32 kg/m  Body mass index is 51.32 kg/m. General appearance: Well nourished, well developed female in no acute distress.  Respiratory: Normal respiratory effort Abdomen: no masses, hernias; diffusely non tender to palpation, non distended Neuro/Psych:  Normal mood and affect.  Skin:  Warm and dry.  Lymphatic:  No inguinal lymphadenopathy.   Pelvic exam: is limited by body habitus EGBUS: within normal limits Vagina: within normal limits and with scant brown d/c in vault blood. Cervix: normal appearing cervix without tenderness, discharge or lesions. Uterus:  nonenlarged and non tender Adnexa:  normal adnexa and no mass, fullness, tenderness Rectovaginal: deferred  See procedure note for endometrial biopsy  Laboratory:  CBC Latest Ref Rng & Units 11/29/2019  WBC 4.0 - 10.5 K/uL 11.0(H)  Hemoglobin 12.0 - 15.0 g/dL 6.6(LL)  Hematocrit 36 - 46 % 23.2(L)  Platelets 150 - 400 K/uL 512(H)    Radiology: none  Assessment: pt stable  Plan:  1. Abnormal uterine bleeding (AUB) Follow up labs today. Continue on megace for now at current dose. If CBC is reasonable then HA and dizziness could be from the megace; usually is divided bid. Will also tvus. I told her if h/h still low then may need another unit of blood.  - Surgical pathology( [place]/ [facility]) - TSH - Follicle stimulating hormone - CBC - US PELVIC COMPLETE WITH TRANSVAGINAL; Future  2. Nonintractable headache, unspecified chronicity pattern, unspecified headache type  3. Dizziness  Orders Placed This Encounter   Procedures  . US PELVIC COMPLETE WITH TRANSVAGINAL  . TSH  . Follicle stimulating hormone  . CBC    RTC based on results  Cornelia Copa MD Attending [facility] [employer] [facility])

## 2019-12-03 NOTE — Progress Notes (Signed)
GYN presents for AUB her last period lasted 3 months 10/04/19-11/30/2019, pelvic pain 8/10. C/o headaches 9/10 and dizziness since her transfusion 11/30/19.

## 2019-12-04 LAB — CBC
Hematocrit: 29.7 % — ABNORMAL LOW (ref 34.0–46.6)
Hemoglobin: 9.2 g/dL — ABNORMAL LOW (ref 11.1–15.9)
MCH: 25.3 pg — ABNORMAL LOW (ref 26.6–33.0)
MCHC: 31 g/dL — ABNORMAL LOW (ref 31.5–35.7)
MCV: 82 fL (ref 79–97)
Platelets: 504 10*3/uL — ABNORMAL HIGH (ref 150–450)
RBC: 3.63 x10E6/uL — ABNORMAL LOW (ref 3.77–5.28)
RDW: 15.4 % (ref 11.7–15.4)
WBC: 11.9 10*3/uL — ABNORMAL HIGH (ref 3.4–10.8)

## 2019-12-04 LAB — FOLLICLE STIMULATING HORMONE: FSH: 13.3 m[IU]/mL

## 2019-12-04 LAB — TSH: TSH: 1.19 u[IU]/mL (ref 0.450–4.500)

## 2019-12-05 LAB — SURGICAL PATHOLOGY

## 2019-12-10 ENCOUNTER — Other Ambulatory Visit: Payer: Self-pay

## 2019-12-10 ENCOUNTER — Ambulatory Visit
Admission: RE | Admit: 2019-12-10 | Discharge: 2019-12-10 | Disposition: A | Discharge status: Home / Self Care | Payer: Medicaid Other | Source: Ambulatory Visit | Attending: Obstetrics and Gynecology | Admitting: Obstetrics and Gynecology

## 2019-12-10 DIAGNOSIS — N939 Abnormal uterine and vaginal bleeding, unspecified: Secondary | ICD-10-CM | POA: Diagnosis present

## 2019-12-11 ENCOUNTER — Encounter: Payer: Self-pay | Admitting: *Deleted

## 2019-12-11 ENCOUNTER — Telehealth: Payer: Self-pay | Admitting: Obstetrics and Gynecology

## 2019-12-11 NOTE — Telephone Encounter (Signed)
GYN Telephone Note  Patient called and d/w her re: normal blood work that doesn't seem to overtly indicate she's in menopause, negative biopsy (showed polypoid tissue) and u/s that showed likely adenomyosis.   I told her that she like does have endometrial polyps and adenomyosis (she has a h/o two c-sections) and that along with being perimenopausal is causing the AUB. I d/w her re: medical and surgical options. Medical options being po pills, lupron for 47m, depo provera or a Mirena IUD and surgical being a hysteroscopy, d&c or a hysterectomy.  She is having HAs still and with such a good blood count I told her it's probably due to the megace. I told her that her best option depends on where she is with her bleeding. If she desires definitive tx, which is very reasonable, I'd recommend a hyst. I d/w her re: trying to do it l/s but risk of open procedure and other surgical risks.   Pt would like to do hyst. I told her to stay on the megace since it's working to keep her from bleeding and that I'll need to have her see her PCP at [facility] for a pre op visit; she states she already has an appt with her for late next week. I also told her that I'm not sure when we can get her scheduled for the major surgery due to hospital census but we'll keep her up to date. I also told her that I recommend an IV iron transfusion pre op.  Pt to think about her options and let me know what she would like to do.  Cornelia Copa MD Attending [facility] [employer] (Faculty Practice) 12/11/2019 Time: [phone]

## 2019-12-26 ENCOUNTER — Other Ambulatory Visit: Payer: Self-pay

## 2019-12-26 DIAGNOSIS — N951 Menopausal and female climacteric states: Secondary | ICD-10-CM

## 2019-12-26 DIAGNOSIS — R4586 Emotional lability: Secondary | ICD-10-CM

## 2019-12-26 MED ORDER — VENLAFAXINE HCL ER 75 MG PO CP24
75.0000 mg | ORAL_CAPSULE | Freq: Every day | ORAL | 11 refills | Status: AC
Start: 2019-12-26 — End: ?

## 2019-12-26 NOTE — Progress Notes (Signed)
Rx refill Effexor sent to pt pharmacy per Misty Stanley, pt made aware.

## 2020-01-09 ENCOUNTER — Inpatient Hospital Stay (HOSPITAL_COMMUNITY)
Admission: EM | Admit: 2020-01-09 | Discharge: 2020-01-12 | DRG: 638 | Disposition: A | Discharge status: Home / Self Care | Payer: Medicaid Other | Attending: Internal Medicine | Admitting: Internal Medicine

## 2020-01-09 ENCOUNTER — Other Ambulatory Visit: Payer: Self-pay

## 2020-01-09 DIAGNOSIS — E1169 Type 2 diabetes mellitus with other specified complication: Secondary | ICD-10-CM | POA: Diagnosis not present

## 2020-01-09 DIAGNOSIS — E7800 Pure hypercholesterolemia, unspecified: Secondary | ICD-10-CM | POA: Diagnosis present

## 2020-01-09 DIAGNOSIS — E785 Hyperlipidemia, unspecified: Secondary | ICD-10-CM | POA: Diagnosis present

## 2020-01-09 DIAGNOSIS — K219 Gastro-esophageal reflux disease without esophagitis: Secondary | ICD-10-CM | POA: Diagnosis present

## 2020-01-09 DIAGNOSIS — J45909 Unspecified asthma, uncomplicated: Secondary | ICD-10-CM | POA: Diagnosis present

## 2020-01-09 DIAGNOSIS — R0602 Shortness of breath: Secondary | ICD-10-CM | POA: Diagnosis not present

## 2020-01-09 DIAGNOSIS — Z20822 Contact with and (suspected) exposure to covid-19: Secondary | ICD-10-CM | POA: Diagnosis present

## 2020-01-09 DIAGNOSIS — Z79899 Other long term (current) drug therapy: Secondary | ICD-10-CM | POA: Diagnosis not present

## 2020-01-09 DIAGNOSIS — I7389 Other specified peripheral vascular diseases: Secondary | ICD-10-CM | POA: Diagnosis present

## 2020-01-09 DIAGNOSIS — E1100 Type 2 diabetes mellitus with hyperosmolarity without nonketotic hyperglycemic-hyperosmolar coma (NKHHC): Secondary | ICD-10-CM | POA: Diagnosis present

## 2020-01-09 DIAGNOSIS — I10 Essential (primary) hypertension: Secondary | ICD-10-CM | POA: Diagnosis present

## 2020-01-09 DIAGNOSIS — Z833 Family history of diabetes mellitus: Secondary | ICD-10-CM

## 2020-01-09 DIAGNOSIS — E876 Hypokalemia: Secondary | ICD-10-CM | POA: Diagnosis present

## 2020-01-09 DIAGNOSIS — R739 Hyperglycemia, unspecified: Secondary | ICD-10-CM | POA: Diagnosis present

## 2020-01-09 DIAGNOSIS — N179 Acute kidney failure, unspecified: Secondary | ICD-10-CM | POA: Diagnosis present

## 2020-01-09 DIAGNOSIS — E1165 Type 2 diabetes mellitus with hyperglycemia: Secondary | ICD-10-CM | POA: Diagnosis present

## 2020-01-09 DIAGNOSIS — Z794 Long term (current) use of insulin: Secondary | ICD-10-CM

## 2020-01-09 DIAGNOSIS — D649 Anemia, unspecified: Secondary | ICD-10-CM | POA: Diagnosis present

## 2020-01-09 DIAGNOSIS — T502X5A Adverse effect of carbonic-anhydrase inhibitors, benzothiadiazides and other diuretics, initial encounter: Secondary | ICD-10-CM | POA: Diagnosis present

## 2020-01-09 DIAGNOSIS — N951 Menopausal and female climacteric states: Secondary | ICD-10-CM | POA: Diagnosis present

## 2020-01-09 DIAGNOSIS — E669 Obesity, unspecified: Secondary | ICD-10-CM | POA: Diagnosis not present

## 2020-01-09 DIAGNOSIS — Z6841 Body Mass Index (BMI) 40.0 and over, adult: Secondary | ICD-10-CM | POA: Diagnosis not present

## 2020-01-09 LAB — I-STAT VENOUS BLOOD GAS, ED
Acid-base deficit: 2 mmol/L (ref 0.0–2.0)
Bicarbonate: 21.8 mmol/L (ref 20.0–28.0)
Calcium, Ion: 1.14 mmol/L — ABNORMAL LOW (ref 1.15–1.40)
HCT: 41 % (ref 36.0–46.0)
Hemoglobin: 13.9 g/dL (ref 12.0–15.0)
O2 Saturation: 93 %
Potassium: 4.1 mmol/L (ref 3.5–5.1)
Sodium: 132 mmol/L — ABNORMAL LOW (ref 135–145)
TCO2: 23 mmol/L (ref 22–32)
pCO2, Ven: 34.6 mmHg — ABNORMAL LOW (ref 44.0–60.0)
pH, Ven: 7.407 (ref 7.250–7.430)
pO2, Ven: 66 mmHg — ABNORMAL HIGH (ref 32.0–45.0)

## 2020-01-09 LAB — BASIC METABOLIC PANEL
Anion gap: 16 — ABNORMAL HIGH (ref 5–15)
BUN: 20 mg/dL (ref 6–20)
CO2: 20 mmol/L — ABNORMAL LOW (ref 22–32)
Calcium: 10.3 mg/dL (ref 8.9–10.3)
Chloride: 96 mmol/L — ABNORMAL LOW (ref 98–111)
Creatinine, Ser: 1.71 mg/dL — ABNORMAL HIGH (ref 0.44–1.00)
GFR, Estimated: 36 mL/min — ABNORMAL LOW (ref >60)
Glucose, Bld: 754 mg/dL (ref 70–99)
Potassium: 4.1 mmol/L (ref 3.5–5.1)
Sodium: 132 mmol/L — ABNORMAL LOW (ref 135–145)

## 2020-01-09 LAB — LIPID PANEL
Cholesterol: 230 mg/dL — ABNORMAL HIGH (ref 0–200)
HDL: 28 mg/dL — ABNORMAL LOW (ref >40)
LDL Cholesterol: 129 mg/dL — ABNORMAL HIGH (ref 0–99)
Total CHOL/HDL Ratio: 8.2 RATIO
Triglycerides: 363 mg/dL — ABNORMAL HIGH (ref <150)
VLDL: 73 mg/dL — ABNORMAL HIGH (ref 0–40)

## 2020-01-09 LAB — URINALYSIS, ROUTINE W REFLEX MICROSCOPIC
Bilirubin Urine: NEGATIVE
Glucose, UA: >=500 mg/dL — AB
Ketones, ur: NEGATIVE mg/dL
Nitrite: NEGATIVE
Protein, ur: 30 mg/dL — AB
Specific Gravity, Urine: 1.029 (ref 1.005–1.030)
pH: 6 (ref 5.0–8.0)

## 2020-01-09 LAB — CBC
HCT: 39.7 % (ref 36.0–46.0)
Hemoglobin: 12.5 g/dL (ref 12.0–15.0)
MCH: 25.7 pg — ABNORMAL LOW (ref 26.0–34.0)
MCHC: 31.5 g/dL (ref 30.0–36.0)
MCV: 81.5 fL (ref 80.0–100.0)
Platelets: 446 10*3/uL — ABNORMAL HIGH (ref 150–400)
RBC: 4.87 MIL/uL (ref 3.87–5.11)
RDW: 16.1 % — ABNORMAL HIGH (ref 11.5–15.5)
WBC: 9.2 10*3/uL (ref 4.0–10.5)
nRBC: 0 % (ref 0.0–0.2)

## 2020-01-09 LAB — CBG MONITORING, ED
Glucose-Capillary: >600 mg/dL (ref 70–99)
Glucose-Capillary: >600 mg/dL (ref 70–99)
Glucose-Capillary: >600 mg/dL (ref 70–99)
Glucose-Capillary: 453 mg/dL — ABNORMAL HIGH (ref 70–99)
Glucose-Capillary: 461 mg/dL — ABNORMAL HIGH (ref 70–99)
Glucose-Capillary: 424 mg/dL — ABNORMAL HIGH (ref 70–99)
Glucose-Capillary: 380 mg/dL — ABNORMAL HIGH (ref 70–99)
Glucose-Capillary: 319 mg/dL — ABNORMAL HIGH (ref 70–99)
Glucose-Capillary: 264 mg/dL — ABNORMAL HIGH (ref 70–99)

## 2020-01-09 LAB — GLUCOSE, CAPILLARY
Glucose-Capillary: 216 mg/dL — ABNORMAL HIGH (ref 70–99)
Glucose-Capillary: 228 mg/dL — ABNORMAL HIGH (ref 70–99)

## 2020-01-09 LAB — PHOSPHORUS: Phosphorus: 2.5 mg/dL (ref 2.5–4.6)

## 2020-01-09 LAB — TROPONIN I (HIGH SENSITIVITY)
Troponin I (High Sensitivity): 13 ng/L (ref <18)
Troponin I (High Sensitivity): 15 ng/L (ref <18)

## 2020-01-09 LAB — I-STAT BETA HCG BLOOD, ED (MC, WL, AP ONLY): I-stat hCG, quantitative: <5 m[IU]/mL (ref <5)

## 2020-01-09 LAB — BETA-HYDROXYBUTYRIC ACID: Beta-Hydroxybutyric Acid: 1.06 mmol/L — ABNORMAL HIGH (ref 0.05–0.27)

## 2020-01-09 LAB — TSH: TSH: 0.445 u[IU]/mL (ref 0.350–4.500)

## 2020-01-09 LAB — D-DIMER, QUANTITATIVE: D-Dimer, Quant: 0.41 ug/mL-FEU (ref 0.00–0.50)

## 2020-01-09 LAB — RESPIRATORY PANEL BY RT PCR (FLU A&B, COVID)
Influenza A by PCR: NEGATIVE
Influenza B by PCR: NEGATIVE
SARS Coronavirus 2 by RT PCR: NEGATIVE

## 2020-01-09 LAB — MAGNESIUM: Magnesium: 2.5 mg/dL — ABNORMAL HIGH (ref 1.7–2.4)

## 2020-01-09 LAB — T4, FREE: Free T4: 0.98 ng/dL (ref 0.61–1.12)

## 2020-01-09 MED ORDER — HEPARIN SODIUM (PORCINE) 5000 UNIT/ML IJ SOLN
5000.0000 [IU] | Freq: Three times a day (TID) | INTRAMUSCULAR | Status: DC
  Administered 2020-01-09 – 2020-01-12 (×8): 5000 [IU] via SUBCUTANEOUS
  Filled 2020-01-09 (×8): qty 1

## 2020-01-09 MED ORDER — MEGESTROL ACETATE 40 MG PO TABS
120.0000 mg | ORAL_TABLET | Freq: Every day | ORAL | Status: DC
  Administered 2020-01-09 – 2020-01-12 (×4): 120 mg via ORAL
  Filled 2020-01-09 (×4): qty 3

## 2020-01-09 MED ORDER — INSULIN REGULAR(HUMAN) IN NACL 100-0.9 UT/100ML-% IV SOLN
INTRAVENOUS | Status: DC
  Administered 2020-01-09: 5.5 [IU]/h via INTRAVENOUS
  Administered 2020-01-09: 13 [IU]/h via INTRAVENOUS
  Administered 2020-01-10: 10 [IU]/h via INTRAVENOUS
  Filled 2020-01-09 (×3): qty 100

## 2020-01-09 MED ORDER — METOPROLOL TARTRATE 50 MG PO TABS
50.0000 mg | ORAL_TABLET | Freq: Every day | ORAL | Status: DC
  Administered 2020-01-09 – 2020-01-11 (×3): 50 mg via ORAL
  Filled 2020-01-09: qty 2
  Filled 2020-01-09 (×2): qty 1

## 2020-01-09 MED ORDER — DEXTROSE IN LACTATED RINGERS 5 % IV SOLN: INTRAVENOUS | Status: DC

## 2020-01-09 MED ORDER — DEXTROSE 50 % IV SOLN: 0.0000 mL | INTRAVENOUS | Status: DC | PRN

## 2020-01-09 MED ORDER — PANTOPRAZOLE SODIUM 40 MG IV SOLR
40.0000 mg | INTRAVENOUS | Status: DC
  Administered 2020-01-09: 40 mg via INTRAVENOUS
  Filled 2020-01-09: qty 40

## 2020-01-09 MED ORDER — POTASSIUM CHLORIDE 10 MEQ/100ML IV SOLN
10.0000 meq | INTRAVENOUS | Status: AC
  Administered 2020-01-09: 10 meq via INTRAVENOUS
  Filled 2020-01-09: qty 100

## 2020-01-09 MED ORDER — SODIUM CHLORIDE 0.9 % IV BOLUS: 1000.0000 mL | Freq: Once | INTRAVENOUS | Status: DC

## 2020-01-09 MED ORDER — HYDROCHLOROTHIAZIDE 25 MG PO TABS
25.0000 mg | ORAL_TABLET | Freq: Every day | ORAL | Status: DC
  Administered 2020-01-09 – 2020-01-11 (×3): 25 mg via ORAL
  Filled 2020-01-09 (×3): qty 1

## 2020-01-09 MED ORDER — LACTATED RINGERS IV SOLN: INTRAVENOUS | Status: DC

## 2020-01-09 MED ORDER — ATORVASTATIN CALCIUM 40 MG PO TABS
40.0000 mg | ORAL_TABLET | Freq: Every day | ORAL | Status: DC
  Filled 2020-01-09: qty 4
  Filled 2020-01-09: qty 1

## 2020-01-09 MED ORDER — LORATADINE 10 MG PO TABS
10.0000 mg | ORAL_TABLET | Freq: Every day | ORAL | Status: DC
  Administered 2020-01-09 – 2020-01-12 (×4): 10 mg via ORAL
  Filled 2020-01-09 (×4): qty 1

## 2020-01-09 MED ORDER — VENLAFAXINE HCL ER 75 MG PO CP24
75.0000 mg | ORAL_CAPSULE | Freq: Every day | ORAL | Status: DC
  Administered 2020-01-09 – 2020-01-12 (×4): 75 mg via ORAL
  Filled 2020-01-09 (×4): qty 1

## 2020-01-09 MED ORDER — SODIUM CHLORIDE 0.9 % IV BOLUS
1500.0000 mL | Freq: Once | INTRAVENOUS | Status: AC
  Administered 2020-01-09: 1500 mL via INTRAVENOUS

## 2020-01-09 NOTE — H&P (Signed)
History and Physical    Deborah Mcclain OZD:[id number] DOB: 1970/05/22 DOA: 01/09/2020  PCP: Laruth Bouchard, MD    Patient coming from:  Home   Chief Complaint:  Hyperglycemia.   HPI: Deborah Mcclain is a 49 y.o. female with medical history significant of DM II since 06/2019 on metformin and recently started on long acting insulin, seen in ed for hyperglycemia with glucose level more than 747. Pt reports that she has been sob, also having diarrhea,polyuria, polydipsia, and sleeping a lot for past several days.  Currently pt denies any chest pain or sob and states her sob is different then  Her asthma sob.  ED Course:  Blood pressure (!) 149/95, pulse 100, resp. rate 20, height 5\' 5"  (1.651 m), weight 132.9 kg, SpO2 100 %. Lab results show serum sodium of 132, serum bicarb of 20, serum glucose of 754, serum creatinine of 1.71 with a GFR of 36, CBC is normal with a white count of 9.2, hemoglobin of 12.5, platelets slightly elevated at 446, urinalysis shows urine is hazy with a straw-color, glucose over 500, moderate ketones, protein of 30, 11-20 RBCs per high-power field, 21-50 WBCs.   Review of Systems: As per HPI otherwise all systems reviewed and negative.  Past Medical History:  Diagnosis Date  . Asthma   . Eczema   . Hypertension   . Type 2 diabetes mellitus (HCC) 12/2018    Past Surgical History:  Procedure Laterality Date  . CESAREAN SECTION  05/25/1997  . CESAREAN SECTION  03/30/2005   twins  . ENDOMETRIAL BIOPSY  12/03/2019     reports that she has never smoked. She has never used smokeless tobacco. She reports current alcohol use. She reports that she does not use drugs.  Allergies  Allergen Reactions  . Other Itching    Ketchup    Family History  Problem Relation Age of Onset  . Diabetes Mother   . Hypertension Mother   . Diabetes Maternal Grandmother     Prior to Admission medications   Medication Sig Start Date End Date Taking? Authorizing Provider    cetirizine (ZYRTEC) 10 MG tablet Take 1 tablet (10 mg total) by mouth daily. 01/16/18  Yes Bast, Traci A, NP  ferrous sulfate 325 (65 FE) MG tablet Take 1 tablet (325 mg total) by mouth daily. 11/29/19  Yes 12/01/19, MD  hydrochlorothiazide (HYDRODIURIL) 25 MG tablet Take 25 mg by mouth daily. 10/08/19  Yes [provider]  LANTUS SOLOSTAR 100 UNIT/ML Solostar Pen Inject 13 Units into the skin at bedtime. 01/02/20  Yes [provider]  metFORMIN (GLUCOPHAGE) 1000 MG tablet Take 1,000 mg by mouth 2 (two) times daily with a meal.    Yes [provider]  metoprolol tartrate (LOPRESSOR) 50 MG tablet Take 50 mg by mouth daily.    Yes [provider]  venlafaxine XR (EFFEXOR-XR) 75 MG 24 hr capsule Take 1 capsule (75 mg total) by mouth daily. 12/26/19  Yes Leftwich-Kirby, 12/28/19, CNM  albuterol (VENTOLIN HFA) 108 (90 Base) MCG/ACT inhaler INHALE 1 TO 2 PUFFS BY MOUTH EVERY 4 HOURS AS NEEDED Patient not taking: INHALE 1 TO 2 PUFFS BY MOUTH EVERY 4 HOURS AS NEEDED 07/01/19   [provider]  estradiol (ESTRACE) 1 MG tablet Take 1 tablet (1 mg total) by mouth daily. Patient not taking: Reported on 07/05/2019 06/26/19   06/28/19 A, CNM  famotidine (PEPCID) 20 MG tablet Take 20 mg by mouth daily. Patient not taking: Reported on  01/09/2020    [provider]  medroxyPROGESTERone (PROVERA) 2.5 MG tablet Take 1 tablet (2.5 mg total) by mouth daily. Patient not taking: Reported on 12/03/2019 10/22/19   Hurshel Party, CNM  megestrol (MEGACE) 40 MG tablet Take 3 tablets (120 mg total) by mouth daily. Patient not taking: Reported on 01/09/2020 11/29/19   Jacalyn Lefevre, MD    Physical Exam: Vitals:   01/09/20 1430 01/09/20 1445 01/09/20 1515 01/09/20 1655  BP: (!) 147/93 (!) 142/97 (!) 167/97 (!) 149/95  Pulse: 95 96 95 100  Resp: (!) 25 19 20    SpO2: 99% 98% 100%   Weight:      Height:        Constitutional: NAD, calm,  comfortable Vitals:   01/09/20 1430 01/09/20 1445 01/09/20 1515 01/09/20 1655  BP: (!) 147/93 (!) 142/97 (!) 167/97 (!) 149/95  Pulse: 95 96 95 100  Resp: (!) 25 19 20    SpO2: 99% 98% 100%   Weight:      Height:       Eyes: PERRL, EOMI lids and conjunctivae normal ENMT: Mucous membranes are dry. Posterior pharynx clear of any exudate or lesions.Normal dentition.  Neck: normal, supple, no masses, no thyromegaly, no carotid bruit  Respiratory: clear to auscultation bilaterally, no wheezing, no crackles. Normal respiratory effort. No accessory muscle use.  Cardiovascular: Regular rate and rhythm,  No extremity edema. 2+ pedal pulses. No carotid bruits.  Abdomen: no tenderness, no masses palpated. No hepatosplenomegaly. Bowel sounds positive.  Musculoskeletal: no clubbing / cyanosis. No joint deformity upper and lower extremities. Pt moving all four ext , no contractures. Normal muscle tone.  Skin: no rashes, lesions, ulcers. No induration Neurologic: CN 2-12 grossly intact. Strength 5/5 in all 4.  Psychiatric: Normal judgment and insight. Alert and oriented x 3. Normal mood.   Labs on Admission: I have personally reviewed following labs and imaging studies  CBC: Recent Labs  Lab 01/09/20 1323 01/09/20 1333  WBC 9.2  --   HGB 12.5 13.9  HCT 39.7 41.0  MCV 81.5  --   PLT 446*  --    Basic Metabolic Panel: Recent Labs  Lab 01/09/20 1323 01/09/20 1333 01/09/20 1830  NA 132* 132*  --   K 4.1 4.1  --   CL 96*  --   --   CO2 20*  --   --   GLUCOSE 754*  --   --   BUN 20  --   --   CREATININE 1.71*  --   --   CALCIUM 10.3  --   --   MG  --   --  2.5*  PHOS  --   --  2.5   GFR: Estimated Creatinine Clearance: 53.7 mL/min (A) (by C-G formula based on SCr of 1.71 mg/dL (H)). Liver Function Tests: No results for input(s): AST, ALT, ALKPHOS, BILITOT, PROT, ALBUMIN in the last 168 hours. No results for input(s): LIPASE, AMYLASE in the last 168 hours. No results for input(s):  AMMONIA in the last 168 hours. Coagulation Profile: No results for input(s): INR, PROTIME in the last 168 hours. Cardiac Enzymes: No results for input(s): CKTOTAL, CKMB, CKMBINDEX, TROPONINI in the last 168 hours. BNP (last 3 results) No results for input(s): PROBNP in the last 8760 hours. HbA1C: No results for input(s): HGBA1C in the last 72 hours. CBG: Recent Labs  Lab 01/09/20 1649 01/09/20 1725 01/09/20 1801 01/09/20 1834 01/09/20 1936  GLUCAP 461* 453* 424* 380* 319*  Lipid Profile: Recent Labs    01/09/20 1830  CHOL 230*  HDL 28*  LDLCALC 129*  TRIG 363*  CHOLHDL 8.2   Thyroid Function Tests: Recent Labs    01/09/20 1830  TSH 0.445   Anemia Panel: No results for input(s): VITAMINB12, FOLATE, FERRITIN, TIBC, IRON, RETICCTPCT in the last 72 hours. Urine analysis:    Component Value Date/Time   COLORURINE STRAW (A) 01/09/2020 1323   APPEARANCEUR HAZY (A) 01/09/2020 1323   LABSPEC 1.029 01/09/2020 1323   PHURINE 6.0 01/09/2020 1323   GLUCOSEU >=500 (A) 01/09/2020 1323   HGBUR SMALL (A) 01/09/2020 1323   BILIRUBINUR NEGATIVE 01/09/2020 1323   KETONESUR NEGATIVE 01/09/2020 1323   PROTEINUR 30 (A) 01/09/2020 1323   NITRITE NEGATIVE 01/09/2020 1323   LEUKOCYTESUR MODERATE (A) 01/09/2020 1323   No intake or output data in the 24 hours ending 01/09/20 1939 Lab Results  Component Value Date   CREATININE 1.71 (H) 01/09/2020   CREATININE 1.10 (H) 11/29/2019    COVID-19 Labs  No results for input(s): DDIMER, FERRITIN, LDH, CRP in the last 72 hours.  Lab Results  Component Value Date   SARSCOV2NAA NEGATIVE 01/09/2020   SARSCOV2NAA NEGATIVE 11/29/2019   SARSCOV2NAA NEGATIVE 11/29/2019   SARSCOV2NAA NEGATIVE 07/02/2019    Radiological Exams on Admission: No results found.  EKG: Independently reviewed.  X. Rhythm 96.  Assessment/Plan Active Problems:   Essential hypertension   Morbid obesity with BMI of 50.0-59.9, adult (HCC)   Menopausal  vasomotor syndrome   Anemia   Hyperglycemia due to diabetes mellitus (HCC)  Hypoglycemia secondary to poorly controlled diabetes mellitus type 2: Patient started on insulin drip with Accu-Cheks q. hourly and hypoglycemia protocol. Check A1c, and home regimen of Metformin as discontinued along with Lantus currently to be resumed upon discharge based on A1c. Patient to be transitioned off insulin drip and restarted on Metformin per protocol.  Shortness of breath:  Patient reports intermittent worsening shortness of breath that her inhalers are not helping.  Due to patient being on estradiol check a stat D-dimer if positive will obtain a CTA, also obtain a 2D echocardiogram due to her hypertension and risk of tensive heart disease and consider evaluation for ischemia with stress test per AM team if persistent of worsening.   Hypertension: Patient is taking hydrochlorothiazide 25 mg at home we will continue that. Pt also to continue her metoprolol 50 mg daily. Cardiac and low-sodium heart healthy diet.  Hyperlipidemia with LDL of 129 and diabetes LDL not at goal seems to be under 70 we will start patient on statin therapy allergies reviewed.  Menopausal vasomotor syndrome: Patient currently on estradiol, and venlafaxine continued.  History of anemia: Currently hemoglobin is normal, we will continue to check and follow.  GERD: We will continue patient on IV PPI therapy.    DVT prophylaxis:  Heparin  Code Status:  Full code   Family CommunicationLanora Manis waddy: [phone].  Disposition Plan:  Home   Consults called:  None  Admission status: Inpatient.    Gertha Calkin MD [facility] Pager [phone] If 7PM-7AM, please contact night-coverage [email or web address] Password [facility] 01/09/2020, 7:39 PM

## 2020-01-09 NOTE — ED Triage Notes (Signed)
[facility]. Pt was diagnosed with prediabetes in Apr and started on metformin. This past week was started on long acting insulin. Home and clinic meter reading is "hi"   Pt also complains of CP when performing ADLs, BlE weakness, increased urination, dry mouth, and headache

## 2020-01-09 NOTE — ED Notes (Signed)
Dinner Tray Ordered @ 1657. 

## 2020-01-09 NOTE — ED Provider Notes (Signed)
[facility] EMERGENCY DEPARTMENT Provider Note   [name]: [id number] Arrival date & time: 01/09/20  1304     History Chief Complaint  Patient presents with  . Hyperglycemia    Dareen Gutzwiller is a 49 y.o. female.  The history is provided by the patient.  Hyperglycemia Blood sugar level PTA:  >600 Severity:  Moderate Onset quality:  Gradual Timing:  Constant Progression:  Unchanged Chronicity:  New Current diabetic therapy:  Metformin and just started lantus last week. Went to PCP for follow up today because blood sugar still very elevated above 500 at home. Context: new diabetes diagnosis   Relieved by:  Nothing Ineffective treatments:  None tried Associated symptoms: increased thirst and polyuria   Associated symptoms: no abdominal pain, no chest pain, no dysuria, no fever, no shortness of breath and no vomiting        Past Medical History:  Diagnosis Date  . Asthma   . Eczema   . Hypertension   . Type 2 diabetes mellitus (HCC) 12/2018    Patient Active Problem List   Diagnosis Date Noted  . Anemia 12/03/2019  . Abnormal uterine bleeding (AUB) 12/03/2019  . History of blood transfusion 12/03/2019  . Essential hypertension 04/16/2019  . Diabetes mellitus type 2 in obese (HCC) 04/16/2019  . Morbid obesity with BMI of 50.0-59.9, adult (HCC) 04/16/2019  . Menopausal vasomotor syndrome 04/16/2019    Past Surgical History:  Procedure Laterality Date  . CESAREAN SECTION  05/25/1997  . CESAREAN SECTION  03/30/2005   twins  . ENDOMETRIAL BIOPSY  12/03/2019     OB History    Gravida  2   Para  2   Term  1   Preterm  1   AB      Living  3     SAB      TAB      Ectopic      Multiple  1   Live Births  3           Family History  Problem Relation Age of Onset  . Diabetes Mother   . Hypertension Mother   . Diabetes Maternal Grandmother     Social History   Tobacco Use  . Smoking status: Never Smoker  . Smokeless  tobacco: Never Used  Vaping Use  . Vaping Use: Never used  Substance Use Topics  . Alcohol use: Yes  . Drug use: Never    Home Medications Prior to Admission medications   Medication Sig Start Date End Date Taking? Authorizing Provider  cetirizine (ZYRTEC) 10 MG tablet Take 1 tablet (10 mg total) by mouth daily. 01/16/18  Yes Bast, Traci A, NP  ferrous sulfate 325 (65 FE) MG tablet Take 1 tablet (325 mg total) by mouth daily. 11/29/19  Yes Jacalyn Lefevre, MD  hydrochlorothiazide (HYDRODIURIL) 25 MG tablet Take 25 mg by mouth daily. 10/08/19  Yes [provider]  metFORMIN (GLUCOPHAGE) 1000 MG tablet Take 1,000 mg by mouth 2 (two) times daily with a meal.    Yes [provider]  metoprolol tartrate (LOPRESSOR) 50 MG tablet Take 50 mg by mouth daily.    Yes [provider]  venlafaxine XR (EFFEXOR-XR) 75 MG 24 hr capsule Take 1 capsule (75 mg total) by mouth daily. 12/26/19  Yes Leftwich-Kirby, Wilmer Floor, CNM  albuterol (VENTOLIN HFA) 108 (90 Base) MCG/ACT inhaler INHALE 1 TO 2 PUFFS BY MOUTH EVERY 4 HOURS AS NEEDED Patient not taking: Reported on 01/09/2020 07/01/19  [provider]  azelastine (ASTELIN) 0.1 % nasal spray Place 2 sprays into both nostrils 2 (two) times daily. Patient not taking: Reported on 06/26/2019 04/17/19   Marcelyn BruinsPadgett, Shaylar Patricia, MD  cyclobenzaprine (FLEXERIL) 10 MG tablet Take 1 tablet by mouth 3 times daily as needed for muscle spasm. Warning: May cause drowsiness. Patient not taking: Reported on 12/03/2019 07/02/19   Mardella LaymanHagler, Brian, MD  estradiol (ESTRACE) 1 MG tablet Take 1 tablet (1 mg total) by mouth daily. Patient not taking: Reported on 07/05/2019 06/26/19   Sharen CounterLeftwich-Kirby, Lisa A, CNM  famotidine (PEPCID) 20 MG tablet Take 20 mg by mouth daily. Patient not taking: Reported on 01/09/2020    [provider]  fluticasone (FLONASE) 50 MCG/ACT nasal spray Place 1 spray into both nostrils daily. Patient not taking: Reported on  12/03/2019 01/16/18   Dahlia ByesBast, Traci A, NP  gabapentin (NEURONTIN) 100 MG capsule Take 100 mg by mouth 3 (three) times daily as needed (pain).  Patient not taking: Reported on 12/03/2019    [provider]  LANTUS SOLOSTAR 100 UNIT/ML Solostar Pen Inject 13 Units into the skin at bedtime. 01/02/20   [provider]  levocetirizine (XYZAL) 5 MG tablet TAKE 1 TABLET BY MOUTH ONCE DAILY IN THE EVENING Patient not taking: Reported on 12/03/2019 10/16/19   Marcelyn BruinsPadgett, Shaylar Patricia, MD  loratadine (CLARITIN) 10 MG tablet Take 10 mg by mouth daily.    [provider]  medroxyPROGESTERone (PROVERA) 2.5 MG tablet Take 1 tablet (2.5 mg total) by mouth daily. Patient not taking: Reported on 12/03/2019 10/22/19   Hurshel PartyLeftwich-Kirby, Lisa A, CNM  megestrol (MEGACE) 40 MG tablet Take 3 tablets (120 mg total) by mouth daily. Patient not taking: Reported on 01/09/2020 11/29/19   Jacalyn LefevreHaviland, Julie, MD  predniSONE (STERAPRED UNI-PAK 21 TAB) 10 MG (21) TBPK tablet Take by mouth daily. Take as directed. Patient not taking: Reported on 12/03/2019 07/02/19   Mardella LaymanHagler, Brian, MD    Allergies    Other  Review of Systems   Review of Systems  Constitutional: Negative for chills and fever.  HENT: Negative for ear pain and sore throat.   Eyes: Negative for pain and visual disturbance.  Respiratory: Negative for cough and shortness of breath.   Cardiovascular: Negative for chest pain and palpitations.  Gastrointestinal: Negative for abdominal pain and vomiting.  Endocrine: Positive for polydipsia and polyuria.  Genitourinary: Negative for dysuria and hematuria.  Musculoskeletal: Negative for arthralgias and back pain.  Skin: Negative for color change and rash.  Neurological: Negative for seizures and syncope.  All other systems reviewed and are negative.   Physical Exam Updated Vital Signs  ED Triage Vitals [01/09/20 1328]  Enc Vitals Group     BP      Pulse      Resp      Temp      Temp src       SpO2      Weight 293 lb (132.9 kg)     Height 5\' 5"  (1.651 m)     Head Circumference      Peak Flow      Pain Score 10     Pain Loc      Pain Edu?      Excl. in GC?     Physical Exam Vitals and nursing note reviewed.  Constitutional:      General: She is not in acute distress.    Appearance: She is well-developed. She is not ill-appearing.  HENT:  Head: Normocephalic and atraumatic.     Nose: Nose normal.     Mouth/Throat:     Mouth: Mucous membranes are dry.  Eyes:     Extraocular Movements: Extraocular movements intact.     Conjunctiva/sclera: Conjunctivae normal.     Pupils: Pupils are equal, round, and reactive to light.  Cardiovascular:     Rate and Rhythm: Normal rate and regular rhythm.     Pulses: Normal pulses.     Heart sounds: Normal heart sounds. No murmur heard.   Pulmonary:     Effort: Pulmonary effort is normal. No respiratory distress.     Breath sounds: Normal breath sounds.  Abdominal:     General: Abdomen is flat.     Palpations: Abdomen is soft.     Tenderness: There is no abdominal tenderness.  Musculoskeletal:        General: Normal range of motion.     Cervical back: Normal range of motion and neck supple.  Skin:    General: Skin is warm and dry.     Capillary Refill: Capillary refill takes less than 2 seconds.  Neurological:     General: No focal deficit present.     Mental Status: She is alert.  Psychiatric:        Mood and Affect: Mood normal.     ED Results / Procedures / Treatments   Labs (all labs ordered are listed, but only abnormal results are displayed) Labs Reviewed  BASIC METABOLIC PANEL - Abnormal; Notable for the following components:      Result Value   Sodium 132 (*)    Chloride 96 (*)    CO2 20 (*)    Glucose, Bld 754 (*)    Creatinine, Ser 1.71 (*)    GFR, Estimated 36 (*)    Anion gap 16 (*)    All other components within normal limits  CBC - Abnormal; Notable for the following components:   MCH 25.7 (*)      RDW 16.1 (*)    Platelets 446 (*)    All other components within normal limits  URINALYSIS, ROUTINE W REFLEX MICROSCOPIC - Abnormal; Notable for the following components:   Color, Urine STRAW (*)    APPearance HAZY (*)    Glucose, UA >=500 (*)    Hgb urine dipstick SMALL (*)    Protein, ur 30 (*)    Leukocytes,Ua MODERATE (*)    Bacteria, UA RARE (*)    All other components within normal limits  BETA-HYDROXYBUTYRIC ACID - Abnormal; Notable for the following components:   Beta-Hydroxybutyric Acid 1.06 (*)    All other components within normal limits  CBG MONITORING, ED - Abnormal; Notable for the following components:   Glucose-Capillary >600 (*)    All other components within normal limits  I-STAT VENOUS BLOOD GAS, ED - Abnormal; Notable for the following components:   pCO2, Ven 34.6 (*)    pO2, Ven 66.0 (*)    Sodium 132 (*)    Calcium, Ion 1.14 (*)    All other components within normal limits  RESPIRATORY PANEL BY RT PCR (FLU A&B, COVID)  CBG MONITORING, ED  I-STAT BETA HCG BLOOD, ED ([facility], [facility], [facility] ONLY)    EKG EKG Interpretation  Date/Time:  Thursday January 09 2020 13:21:10 EDT Ventricular Rate:  96 PR Interval:    QRS Duration: 96 QT Interval:  347 QTC Calculation: 439 R Axis:   89 Text Interpretation: Sinus rhythm Confirmed by Virgina Norfolk [phone]) on  01/09/2020 1:24:04 PM Also confirmed by Virgina Norfolk [phone])  on 01/09/2020 1:34:34 PM   Radiology No results found.  Procedures .Critical Care Performed by: Virgina Norfolk, DO Authorized by: Virgina Norfolk, DO   Critical care provider statement:    Critical care time (minutes):  35   Critical care was necessary to treat or prevent imminent or life-threatening deterioration of the following conditions:  Endocrine crisis and dehydration   Critical care was time spent personally by me on the following activities:  Blood draw for specimens, development of treatment plan with patient or surrogate, discussions  with primary provider, evaluation of patient's response to treatment, examination of patient, obtaining history from patient or surrogate, ordering and performing treatments and interventions, ordering and review of laboratory studies, ordering and review of radiographic studies, re-evaluation of patient's condition, pulse oximetry and review of old charts   I assumed direction of critical care for this patient from another provider in my specialty: no     (including critical care time)  Medications Ordered in ED Medications  insulin regular, human (MYXREDLIN) 100 units/ 100 mL infusion (has no administration in time range)  lactated ringers infusion (has no administration in time range)  dextrose 5 % in lactated ringers infusion (has no administration in time range)  dextrose 50 % solution 0-50 mL (has no administration in time range)  sodium chloride 0.9 % bolus 1,500 mL (1,500 mLs Intravenous New Bag/Given 01/09/20 1353)    ED Course  I have reviewed the triage vital signs and the nursing notes.  Pertinent labs & imaging results that were available during my care of the patient were reviewed by me and considered in my medical decision making (see chart for details).    MDM Rules/Calculators/A&P                          Yvette Loveless is a 49 year old female with history of hypertension, high cholesterol who presents to the ED with hyperglycemia.  Unremarkable vitals.  No fever.  Recently diagnosed with diabetes this year and was on Metformin.  However was started on Lantus last week.  Continued to have blood sugars in the 500s or higher at home.  Followed up by clinic today and got the same reading of blood sugar greater than 600.  Was sent for further evaluation for DKA.  She has had increased thirst and increased urination.  No other infectious symptoms.  Will evaluate for diabetic emergencies.  Will give fluid bolus.  Clinically she appears dehydrated on exam.  Patient with pH of 7.4.   Blood sugar 754.  Anion gap is 16.  Bicarb of 20.  Serum ketones elevated.  Overall patient with severe hyperglycemia causing some metabolic derangements.  She also has an AKI with a creatinine of 1.71.  Patient to be given fluid bolus and started on IV insulin per Endo tool protocol.  Maintenance fluids to also be ordered.  Overall suspect hyperglycemia in the setting of newly diagnosed diabetes.  Will admit for further care.  Mental status is normal.  She would benefit also from further diabetes education.  This chart was dictated using voice recognition software.  Despite best efforts to proofread,  errors can occur which can change the documentation meaning.    Final Clinical Impression(s) / ED Diagnoses Final diagnoses:  Hyperglycemia    Rx / DC Orders ED Discharge Orders    None       Virgina Norfolk, DO 01/09/20  1406  

## 2020-01-09 NOTE — ED Notes (Signed)
Pt states these labs were drawn about 30 minutes ago. ( d dimmer and Trop )

## 2020-01-10 ENCOUNTER — Inpatient Hospital Stay (HOSPITAL_COMMUNITY): Payer: Medicaid Other

## 2020-01-10 DIAGNOSIS — R0602 Shortness of breath: Secondary | ICD-10-CM

## 2020-01-10 DIAGNOSIS — Z6841 Body Mass Index (BMI) 40.0 and over, adult: Secondary | ICD-10-CM

## 2020-01-10 DIAGNOSIS — D649 Anemia, unspecified: Secondary | ICD-10-CM

## 2020-01-10 LAB — BASIC METABOLIC PANEL
Anion gap: 11 (ref 5–15)
Anion gap: 10 (ref 5–15)
BUN: 18 mg/dL (ref 6–20)
BUN: 15 mg/dL (ref 6–20)
CO2: 21 mmol/L — ABNORMAL LOW (ref 22–32)
CO2: 23 mmol/L (ref 22–32)
Calcium: 9.7 mg/dL (ref 8.9–10.3)
Calcium: 9.5 mg/dL (ref 8.9–10.3)
Chloride: 106 mmol/L (ref 98–111)
Chloride: 107 mmol/L (ref 98–111)
Creatinine, Ser: 1.35 mg/dL — ABNORMAL HIGH (ref 0.44–1.00)
Creatinine, Ser: 1.29 mg/dL — ABNORMAL HIGH (ref 0.44–1.00)
GFR, Estimated: 48 mL/min — ABNORMAL LOW (ref >60)
GFR, Estimated: 50 mL/min — ABNORMAL LOW (ref >60)
Glucose, Bld: 252 mg/dL — ABNORMAL HIGH (ref 70–99)
Glucose, Bld: 179 mg/dL — ABNORMAL HIGH (ref 70–99)
Potassium: 3.3 mmol/L — ABNORMAL LOW (ref 3.5–5.1)
Potassium: 3.3 mmol/L — ABNORMAL LOW (ref 3.5–5.1)
Sodium: 138 mmol/L (ref 135–145)
Sodium: 140 mmol/L (ref 135–145)

## 2020-01-10 LAB — GLUCOSE, CAPILLARY
Glucose-Capillary: 169 mg/dL — ABNORMAL HIGH (ref 70–99)
Glucose-Capillary: 175 mg/dL — ABNORMAL HIGH (ref 70–99)
Glucose-Capillary: 179 mg/dL — ABNORMAL HIGH (ref 70–99)
Glucose-Capillary: 190 mg/dL — ABNORMAL HIGH (ref 70–99)
Glucose-Capillary: 211 mg/dL — ABNORMAL HIGH (ref 70–99)
Glucose-Capillary: 217 mg/dL — ABNORMAL HIGH (ref 70–99)
Glucose-Capillary: 234 mg/dL — ABNORMAL HIGH (ref 70–99)
Glucose-Capillary: 239 mg/dL — ABNORMAL HIGH (ref 70–99)
Glucose-Capillary: 249 mg/dL — ABNORMAL HIGH (ref 70–99)
Glucose-Capillary: 256 mg/dL — ABNORMAL HIGH (ref 70–99)
Glucose-Capillary: 273 mg/dL — ABNORMAL HIGH (ref 70–99)
Glucose-Capillary: 324 mg/dL — ABNORMAL HIGH (ref 70–99)
Glucose-Capillary: 430 mg/dL — ABNORMAL HIGH (ref 70–99)

## 2020-01-10 LAB — ECHOCARDIOGRAM COMPLETE
Area-P 1/2: 2.24 cm2
Calc EF: 68 %
Height: 65 in
S' Lateral: 2.4 cm
Single Plane A2C EF: 64.1 %
Single Plane A4C EF: 71.4 %
Weight: 4688 oz

## 2020-01-10 LAB — HEMOGLOBIN A1C
Hgb A1c MFr Bld: 14 % — ABNORMAL HIGH (ref 4.8–5.6)
Mean Plasma Glucose: 355 mg/dL

## 2020-01-10 MED ORDER — INSULIN STARTER KIT- PEN NEEDLES (ENGLISH)
1.0000 | Freq: Once | Status: AC
  Administered 2020-01-10: 1
  Filled 2020-01-10 (×2): qty 1

## 2020-01-10 MED ORDER — PANTOPRAZOLE SODIUM 40 MG PO TBEC
40.0000 mg | DELAYED_RELEASE_TABLET | Freq: Every day | ORAL | Status: DC
  Administered 2020-01-10 – 2020-01-12 (×3): 40 mg via ORAL
  Filled 2020-01-10 (×3): qty 1

## 2020-01-10 MED ORDER — ACETAMINOPHEN 325 MG PO TABS
650.0000 mg | ORAL_TABLET | Freq: Four times a day (QID) | ORAL | Status: DC | PRN
  Administered 2020-01-10: 650 mg via ORAL

## 2020-01-10 MED ORDER — POTASSIUM CHLORIDE CRYS ER 20 MEQ PO TBCR
40.0000 meq | EXTENDED_RELEASE_TABLET | Freq: Once | ORAL | Status: AC
  Administered 2020-01-10: 40 meq via ORAL
  Filled 2020-01-10: qty 2

## 2020-01-10 MED ORDER — INSULIN ASPART 100 UNIT/ML ~~LOC~~ SOLN
6.0000 [IU] | Freq: Three times a day (TID) | SUBCUTANEOUS | Status: DC
  Administered 2020-01-10: 6 [IU] via SUBCUTANEOUS

## 2020-01-10 MED ORDER — INSULIN ASPART 100 UNIT/ML ~~LOC~~ SOLN
0.0000 [IU] | Freq: Three times a day (TID) | SUBCUTANEOUS | Status: DC
  Administered 2020-01-10: 15 [IU] via SUBCUTANEOUS
  Administered 2020-01-11 (×2): 20 [IU] via SUBCUTANEOUS
  Administered 2020-01-11 – 2020-01-12 (×3): 15 [IU] via SUBCUTANEOUS

## 2020-01-10 MED ORDER — INSULIN ASPART 100 UNIT/ML ~~LOC~~ SOLN
0.0000 [IU] | Freq: Three times a day (TID) | SUBCUTANEOUS | Status: DC
  Administered 2020-01-10: 8 [IU] via SUBCUTANEOUS

## 2020-01-10 MED ORDER — INSULIN GLARGINE 100 UNIT/ML ~~LOC~~ SOLN
28.0000 [IU] | Freq: Every day | SUBCUTANEOUS | Status: DC
  Administered 2020-01-10: 28 [IU] via SUBCUTANEOUS
  Filled 2020-01-10 (×2): qty 0.28

## 2020-01-10 MED ORDER — INSULIN GLARGINE 100 UNIT/ML ~~LOC~~ SOLN: 25.0000 [IU] | Freq: Every day | SUBCUTANEOUS | Status: DC

## 2020-01-10 MED ORDER — INSULIN GLARGINE 100 UNIT/ML ~~LOC~~ SOLN
15.0000 [IU] | Freq: Every day | SUBCUTANEOUS | Status: DC
  Administered 2020-01-10: 15 [IU] via SUBCUTANEOUS
  Filled 2020-01-10 (×2): qty 0.15

## 2020-01-10 MED ORDER — FLUTICASONE PROPIONATE 50 MCG/ACT NA SUSP
2.0000 | Freq: Every day | NASAL | Status: DC
  Administered 2020-01-10 – 2020-01-12 (×3): 2 via NASAL
  Filled 2020-01-10: qty 16

## 2020-01-10 MED ORDER — PERFLUTREN LIPID MICROSPHERE
1.0000 mL | INTRAVENOUS | Status: AC | PRN
  Administered 2020-01-10: 2 mL via INTRAVENOUS
  Filled 2020-01-10: qty 10

## 2020-01-10 MED ORDER — INSULIN ASPART 100 UNIT/ML ~~LOC~~ SOLN
11.0000 [IU] | Freq: Once | SUBCUTANEOUS | Status: AC
  Administered 2020-01-10: 11 [IU] via SUBCUTANEOUS

## 2020-01-10 MED ORDER — INSULIN ASPART 100 UNIT/ML ~~LOC~~ SOLN
10.0000 [IU] | Freq: Three times a day (TID) | SUBCUTANEOUS | Status: DC
  Administered 2020-01-10: 10 [IU] via SUBCUTANEOUS

## 2020-01-10 MED ORDER — LIVING WELL WITH DIABETES BOOK
Freq: Once | Status: AC
  Filled 2020-01-10 (×2): qty 1

## 2020-01-10 NOTE — Plan of Care (Signed)
°  RD consulted for nutrition education regarding diabetes.   Lab Results  Component Value Date   HGBA1C 14.0 (H) 01/09/2020    RD provided "Carbohydrate Counting for People with Diabetes" handout from the Academy of Nutrition and Dietetics. Discussed different food groups and their effects on blood sugar, emphasizing carbohydrate-containing foods. Provided list of carbohydrates and recommended serving sizes of common foods.Reviewed pt's dietary recall and provided suggestions that would be relevant to her current lifestyle.   Discussed importance of controlled and consistent carbohydrate intake throughout the day. Provided examples of ways to balance meals/snacks and encouraged intake of high-fiber, whole grain complex carbohydrates. Teach back method used.  Expect good compliance.   Body mass index is 48.76 kg/m. Pt meets criteria for morbid obesity based on current BMI.  Current diet order is carb modified, patient is consuming approximately 100% of meals at this time. Labs and medications reviewed. No further nutrition interventions warranted at this time. RD contact information provided. If additional nutrition issues arise, please re-consult RD.  Eugene Gavia, MS, RD, LDN RD pager number and weekend/on-call pager number located in [place].

## 2020-01-10 NOTE — Progress Notes (Signed)
Inpatient Diabetes Program Recommendations  AACE/ADA: New Consensus Statement on Inpatient Glycemic Control (2015)  Target Ranges:  Prepandial:   less than 140 mg/dL      Peak postprandial:   less than 180 mg/dL (1-2 hours)      Critically ill patients:  140 - 180 mg/dL   Lab Results  Component Value Date   GLUCAP 273 (H) 01/10/2020   HGBA1C 14.0 (H) 01/09/2020    Review of Glycemic Control Results for Deborah Mcclain, Deborah Mcclain (MRN [id number]) as of 01/10/2020 13:07  Ref. Range 01/10/2020 08:31 01/10/2020 10:33 01/10/2020 11:19  Glucose-Capillary Latest Ref Range: 70 - 99 mg/dL 102 (H) 585 (H) 277 (H)   Diabetes history: New onset type 2 DM Outpatient Diabetes medications: Lantus 13 units QHS (added this week at [facility]), Metformin 1000 mg BID Current orders for Inpatient glycemic control: Lantus 28 units QD, Novolog 6 units TID, Novolog 0-15 units TID  Inpatient Diabetes Program Recommendations:    Noted transition off IV insulin today. Patient was receiving an average of 10 units of insulin/hr. Thus, anticipate insulin needs to be increased.   Consider: -Increasing Novolog 10 units TID (assuming patient is consuming >50% of meal).  -Increase correction to Novolog 0-20 units TID -Add Novolog 0-5 units QHS -Increasing Lantus dose to BID.  Spoke with patient regarding new onset DM. Patient was recently diagnosed in April with Prediabetes and sent home with Metformin. While at [facility] this week, MD added Lantus and noted increased A1C. Patient has had daughter perform injections. However, glucose meter at home has continued to read "hi" even with fastings. Reviewed patient's current A1c of 14.0%. Explained what a A1c is and what it measures. Also reviewed goal A1c with patient, importance of good glucose control @ home, and blood sugar goals. Reviewed patho of DM, role of pancreas, need for insulin, differences between long acting vs short acting, signs and symptoms of hyper vs hypo, interventions,  vascular changes and commorbidities.  Patient has a meter and has been checking 2 times per day. Reviewed recommended frequency of atleast 3 times per day of multiple daily injections and reviewed when to call MD. Anticipate insulin needs to be increased.  Patient was consuming large amounts of sugary beverages and eats fast food most days of the week. Dietitian has seen patient. Reviewed alternative, plate method and simple carb counting. Patient has no questions at this time and plans to follow up with [facility].   Thanks, Lujean Rave, MSN, RNC-OB Diabetes Coordinator [phone] (8a-5p)

## 2020-01-10 NOTE — TOC Initial Note (Signed)
Transition of Care [facility]) - Initial/Assessment Note    Patient Details  Name: Deborah Mcclain MRN: [id number] Date of Birth: May 25, 1970  [facility] CM/SW Contact:    Kermit Balo, RN Phone Number: 01/10/2020, 1:02 PM  Clinical Narrative:                 Pt lives at home with her children and her mother is in and out. She denies any issues with home medications or transportation. TOC following.  Expected Discharge Plan: Home/Self Care Barriers to Discharge: Continued Medical Work up   Patient Goals and CMS Choice        Expected Discharge Plan and Services Expected Discharge Plan: Home/Self Care   Discharge Planning Services: CM Consult   Living arrangements for the past 2 months: Single Family Home                                      Prior Living Arrangements/Services Living arrangements for the past 2 months: Single Family Home Lives with:: Minor Children Patient language and need for interpreter reviewed:: Yes Do you feel safe going back to the place where you live?: Yes      Need for Family Participation in Patient Care: No (Comment) Care giver support system in place?: No (comment)   Criminal Activity/Legal Involvement Pertinent to Current Situation/Hospitalization: No - Comment as needed  Activities of Daily Living      Permission Sought/Granted                  Emotional Assessment Appearance:: Appears stated age Attitude/Demeanor/Rapport: Engaged Affect (typically observed): Accepting Orientation: : Oriented to Self, Oriented to Place, Oriented to  Time, Oriented to Situation   Psych Involvement: No (comment)  Admission diagnosis:  Hyperglycemia [R73.9] Hyperglycemia due to diabetes mellitus (HCC) [E11.65] Patient Active Problem List   Diagnosis Date Noted  . Hyperglycemia due to diabetes mellitus (HCC) 01/09/2020  . Anemia 12/03/2019  . Abnormal uterine bleeding (AUB) 12/03/2019  . History of blood transfusion  12/03/2019  . Essential hypertension 04/16/2019  . Diabetes mellitus type 2 in obese (HCC) 04/16/2019  . Morbid obesity with BMI of 50.0-59.9, adult (HCC) 04/16/2019  . Menopausal vasomotor syndrome 04/16/2019   PCP:  Laruth Bouchard, MD Pharmacy:   [facility] [address] [place] - [address] [place] [address] Phone: [phone] Fax: [phone]     Social Determinants of Health (SDOH) Interventions    Readmission Risk Interventions No flowsheet data found.

## 2020-01-10 NOTE — Discharge Instructions (Signed)
Diabetes Mellitus and Sick Day Management Blood sugar (glucose) can be difficult to control when you are sick. Common illnesses that can cause problems for people with diabetes (diabetes mellitus) include colds, fever, flu (influenza), nausea, vomiting, and diarrhea. These illnesses can cause stress and loss of body fluids (dehydration), and those issues can cause blood glucose levels to increase. Because of this, it is very important to take your insulin and diabetes medicines and eat some form of carbohydrate when you are sick. You should make a plan for days when you are sick (sick day plan) as part of your diabetes management plan. You and your health care provider should make this plan in advance. The following guidelines are intended to help you manage an illness that lasts for about 24 hours or less. Your health care provider may also give you more specific instructions. What do I need to do to manage my blood glucose?   Check your blood glucose every 2-4 hours, or as often as told by your health care provider.  Know your sick day treatment goals. Your target blood glucose levels may be different when you are sick.  If you use insulin, take your usual dose. ? If your blood glucose continues to be too high, you may need to take an additional insulin dose as told by your health care provider.  If you use oral diabetes medicine, you may need to stop taking it if you are not able to eat or drink normally. Ask your health care provider about whether you need to stop taking these medicines while you are sick.  If you use injectable hormone medicines other than insulin to control your diabetes, ask your health care provider about whether you need to stop taking these medicines while you are sick. What else can I do to manage my diabetes when I am sick? Check your ketones  If you have type 1 diabetes, check your urine ketones every 4 hours.  If you have type 2 diabetes, check your urine ketones  as often as told by your health care provider. Drink fluids  Drink enough fluid to keep your urine clear or pale yellow. This is especially important if you have a fever, vomiting, or diarrhea. Those symptoms can lead to dehydration.  Follow any instructions from your health care provider about beverages to avoid. ? Do not drink alcohol, caffeine, or drinks that contain a lot of sugar. Take medicines as directed  Take-over-the-counter and prescription medicines only as told by your health care provider.  Check medicine labels for added sugars. Some medicines may contain sugar or types of sugars that can raise your blood glucose level. What foods can I eat when I am sick?  You need to eat some form of carbohydrates when you are sick. You should eat 45-50 grams (45-50 g) of carbohydrates every 3-4 hours until you feel better. All of the food choices below contain about 15 g of carbohydrates. Plan ahead and keep some of these foods around so you have them if you get sick.  4-6 oz (120-177 mL) carbonated beverage that contains sugar, such as regular (not diet) soda. You may be able to drink carbonated beverages more easily if you open the beverage and let it sit at room temperature for a few minutes before drinking.   of a twin frozen ice pop.  4 oz (120 g) regular gelatin.  4 oz (120 mL) fruit juice.  4 oz (120 g) ice cream or frozen yogurt.  2  oz (60 g) sherbet.  8 oz (240 mL) clear broth or soup.  4 oz (120 g) regular custard.  4 oz (120 g) regular pudding.  8 oz (240 g) plain yogurt.  1 slice bread or toast.  6 saltine crackers.  5 vanilla wafers. Questions to ask your health care provider Consider asking the following questions so you know what to do on days when you are sick:  Should I adjust my diabetes medicines?  How often do I need to check my blood glucose?  What supplies do I need to manage my diabetes at home when I am sick?  What number can I call if I  have questions?  What foods and drinks should I avoid? Contact a health care provider if:  You develop symptoms of diabetic ketoacidosis, such as: ? Fatigue. ? Weight loss. ? Excessive thirst. ? Light-headedness. ? Fruity or sweet-smelling breath. ? Excessive urination. ? Vision changes. ? Confusion or irritability. ? Nausea. ? Vomiting. ? Rapid breathing. ? Pain in the abdomen. ? Feeling flushed.  You are unable to drink fluids without vomiting.  You have any of the following for more than 6 hours: ? Nausea. ? Vomiting. ? Diarrhea.  Your blood glucose is at or above 240 mg/dL (13.3 mmol/L), even after you take an additional insulin dose.  You have a change in how you think, feel, or act (mental status).  You develop another serious illness.  You have been sick or have had a fever for 2 days or longer and you are not getting better. Get help right away if:  Your blood glucose is lower than 54 mg/dL (3.0 mmol/L).  You have difficulty breathing.  You have moderate or high ketone levels in your urine.  You used emergency glucagon to treat low blood glucose. Summary  Blood sugar (glucose) can be difficult to control when you are sick. Common illnesses that can cause problems for people with diabetes (diabetes mellitus) include colds, fever, flu (influenza), nausea, vomiting, and diarrhea.  Illnesses can cause stress and loss of body fluids (dehydration), and those issues can cause blood glucose levels to increase.  Make a plan for days when you are sick (sick day plan) as part of your diabetes management plan. You and your health care provider should make this plan in advance.  It is very important to take your insulin and diabetes medicines and to eat some form of carbohydrate when you are sick.  Contact your health care provider if have problems managing your blood glucose levels when you are sick, or if you have been sick or had a fever for 2 days or longer and are  not getting better. This information is not intended to replace advice given to you by your health care provider. Make sure you discuss any questions you have with your health care provider. Document Revised: 11/27/2015 Document Reviewed: 11/27/2015 Elsevier Patient Education  [place]. Blood Glucose Monitoring, Adult Monitoring your blood sugar (glucose) is an important part of managing your diabetes (diabetes mellitus). Blood glucose monitoring involves checking your blood glucose as often as directed and keeping a record (log) of your results over time. Checking your blood glucose regularly and keeping a blood glucose log can:  Help you and your health care provider adjust your diabetes management plan as needed, including your medicines or insulin.  Help you understand how food, exercise, illnesses, and medicines affect your blood glucose.  Let you know what your blood glucose is at any  time. You can quickly find out if you have low blood glucose (hypoglycemia) or high blood glucose (hyperglycemia). Your health care provider will set individualized treatment goals for you. Your goals will be based on your age, other medical conditions you have, and how you respond to diabetes treatment. Generally, the goal of treatment is to maintain the following blood glucose levels:  Before meals (preprandial): 80-130 mg/dL (4.4-7.2 mmol/L).  After meals (postprandial): below 180 mg/dL (10 mmol/L).  A1c level: less than 7%. Supplies needed:  Blood glucose meter.  Test strips for your meter. Each meter has its own strips. You must use the strips that came with your meter.  A needle to prick your finger (lancet). Do not use a lancet more than one time.  A device that holds the lancet (lancing device).  A journal or log book to write down your results. How to check your blood glucose  1. Wash your hands with soap and water. 2. Prick the side of your finger (not the tip) with the  lancet. Use a different finger each time. 3. Gently rub the finger until a small drop of blood appears. 4. Follow instructions that come with your meter for inserting the test strip, applying blood to the strip, and using your blood glucose meter. 5. Write down your result and any notes. Some meters allow you to use areas of your body other than your finger (alternative sites) to test your blood. The most common alternative sites are:  Forearm.  Thigh.  Palm of the hand. If you think you may have hypoglycemia, or if you have a history of not knowing when your blood glucose is getting low (hypoglycemia unawareness), do not use alternative sites. Use your finger instead. Alternative sites may not be as accurate as the fingers, because blood flow is slower in these areas. This means that the result you get may be delayed, and it may be different from the result that you would get from your finger. Follow these instructions at home: Blood glucose log   Every time you check your blood glucose, write down your result. Also write down any notes about things that may be affecting your blood glucose, such as your diet and exercise for the day. This information can help you and your health care provider: ? Look for patterns in your blood glucose over time. ? Adjust your diabetes management plan as needed.  Check if your meter allows you to download your records to a computer. Most glucose meters store a record of glucose readings in the meter. If you have type 1 diabetes:  Check your blood glucose 2 or more times a day.  Also check your blood glucose: ? Before every insulin injection. ? Before and after exercise. ? Before meals. ? 2 hours after a meal. ? Occasionally between 2:00 a.m. and 3:00 a.m., as directed. ? Before potentially dangerous tasks, like driving or using heavy machinery. ? At bedtime.  You may need to check your blood glucose more often, up to 6-10 times a day, if you: ? Use  an insulin pump. ? Need multiple daily injections (MDI). ? Have diabetes that is not well-controlled. ? Are ill. ? Have a history of severe hypoglycemia. ? Have hypoglycemia unawareness. If you have type 2 diabetes:  If you take insulin or other diabetes medicines, check your blood glucose 2 or more times a day.  If you are on intensive insulin therapy, check your blood glucose 4 or more times a day.  Occasionally, you may also need to check between 2:00 a.m. and 3:00 a.m., as directed.  Also check your blood glucose: ? Before and after exercise. ? Before potentially dangerous tasks, like driving or using heavy machinery.  You may need to check your blood glucose more often if: ? Your medicine is being adjusted. ? Your diabetes is not well-controlled. ? You are ill. General tips  Always keep your supplies with you.  If you have questions or need help, all blood glucose meters have a 24-hour "hotline" phone number that you can call. You may also contact your health care provider.  After you use a few boxes of test strips, adjust (calibrate) your blood glucose meter by following instructions that came with your meter. Contact a health care provider if:  Your blood glucose is at or above 240 mg/dL (13.3 mmol/L) for 2 days in a row.  You have been sick or have had a fever for 2 days or longer, and you are not getting better.  You have any of the following problems for more than 6 hours: ? You cannot eat or drink. ? You have nausea or vomiting. ? You have diarrhea. Get help right away if:  Your blood glucose is lower than 54 mg/dL (3 mmol/L).  You become confused or you have trouble thinking clearly.  You have difficulty breathing.  You have moderate or large ketone levels in your urine. Summary  Monitoring your blood sugar (glucose) is an important part of managing your diabetes (diabetes mellitus).  Blood glucose monitoring involves checking your blood glucose as often  as directed and keeping a record (log) of your results over time.  Your health care provider will set individualized treatment goals for you. Your goals will be based on your age, other medical conditions you have, and how you respond to diabetes treatment.  Every time you check your blood glucose, write down your result. Also write down any notes about things that may be affecting your blood glucose, such as your diet and exercise for the day. This information is not intended to replace advice given to you by your health care provider. Make sure you discuss any questions you have with your health care provider. Document Revised: 12/22/2017 Document Reviewed: 08/10/2015 Elsevier Patient Education  [place]. Hypoglycemia Hypoglycemia is when the sugar (glucose) level in your blood is too low. Signs of low blood sugar may include:  Feeling: ? Hungry. ? Worried or nervous (anxious). ? Sweaty and clammy. ? Confused. ? Dizzy. ? Sleepy. ? Sick to your stomach (nauseous).  Having: ? A fast heartbeat. ? A headache. ? A change in your vision. ? Tingling or no feeling (numbness) around your mouth, lips, or tongue. ? Jerky movements that you cannot control (seizure).  Having trouble with: ? Moving (coordination). ? Sleeping. ? Passing out (fainting). ? Getting upset easily (irritability). Low blood sugar can happen to people who have diabetes and people who do not have diabetes. Low blood sugar can happen quickly, and it can be an emergency. Treating low blood sugar Low blood sugar is often treated by eating or drinking something sugary right away, such as:  Fruit juice, 4-6 oz (120-150 mL).  Regular soda (not diet soda), 4-6 oz (120-150 mL).  Low-fat milk, 4 oz (120 mL).  Several pieces of hard candy.  Sugar or honey, 1 Tbsp (15 mL). Treating low blood sugar if you have diabetes If you can think clearly and swallow safely, follow the 15:15 rule:  Take 15 grams of a  fast-acting carb (carbohydrate). Talk with your doctor about how much you should take.  Always keep a source of fast-acting carb with you, such as: ? Sugar tablets (glucose pills). Take 3-4 pills. ? 6-8 pieces of hard candy. ? 4-6 oz (120-150 mL) of fruit juice. ? 4-6 oz (120-150 mL) of regular (not diet) soda. ? 1 Tbsp (15 mL) honey or sugar.  Check your blood sugar 15 minutes after you take the carb.  If your blood sugar is still at or below 70 mg/dL (3.9 mmol/L), take 15 grams of a carb again.  If your blood sugar does not go above 70 mg/dL (3.9 mmol/L) after 3 tries, get help right away.  After your blood sugar goes back to normal, eat a meal or a snack within 1 hour.  Treating very low blood sugar If your blood sugar is at or below 54 mg/dL (3 mmol/L), you have very low blood sugar (severe hypoglycemia). This may also cause:  Passing out.  Jerky movements you cannot control (seizure).  Losing consciousness (coma). This is an emergency. Do not wait to see if the symptoms will go away. Get medical help right away. Call your local emergency services (911 in the [place] Do not drive yourself to the hospital. If you have very low blood sugar and you cannot eat or drink, you may need a glucagon shot (injection). A family member or friend should learn how to check your blood sugar and how to give you a glucagon shot. Ask your doctor if you need to have a glucagon shot kit at home. Follow these instructions at home: General instructions  Take over-the-counter and prescription medicines only as told by your doctor.  Stay aware of your blood sugar as told by your doctor.  Limit alcohol intake to no more than 1 drink a day for nonpregnant women and 2 drinks a day for men. One drink equals 12 oz of beer (355 mL), 5 oz of wine (148 mL), or 1 oz of hard liquor (44 mL).  Keep all follow-up visits as told by your doctor. This is important. If you have diabetes:   Follow your diabetes  care plan as told by your doctor. Make sure you: ? Know the signs of low blood sugar. ? Take your medicines as told. ? Follow your exercise and meal plan. ? Eat on time. Do not skip meals. ? Check your blood sugar as often as told by your doctor. Always check it before and after exercise. ? Follow your sick day plan when you cannot eat or drink normally. Make this plan ahead of time with your doctor.  Share your diabetes care plan with: ? Your work or school. ? People you live with.  Check your pee (urine) for ketones: ? When you are sick. ? As told by your doctor.  Carry a card or wear jewelry that says you have diabetes. Contact a doctor if:  You have trouble keeping your blood sugar in your target range.  You have low blood sugar often. Get help right away if:  You still have symptoms after you eat or drink something sugary.  Your blood sugar is at or below 54 mg/dL (3 mmol/L).  You have jerky movements that you cannot control.  You pass out. These symptoms may be an emergency. Do not wait to see if the symptoms will go away. Get medical help right away. Call your local emergency services (911 in the [place] Do  not drive yourself to the hospital. Summary  Hypoglycemia happens when the level of sugar (glucose) in your blood is too low.  Low blood sugar can happen to people who have diabetes and people who do not have diabetes. Low blood sugar can happen quickly, and it can be an emergency.  Make sure you know the signs of low blood sugar and know how to treat it.  Always keep a source of sugar (fast-acting carb) with you to treat low blood sugar. This information is not intended to replace advice given to you by your health care provider. Make sure you discuss any questions you have with your health care provider. Document Revised: 06/21/2018 Document Reviewed: 04/03/2015 Elsevier Patient Education  [place]. Hyperglycemia Hyperglycemia occurs when the level of  sugar (glucose) in the blood is too high. Glucose is a type of sugar that provides the body's main source of energy. Certain hormones (insulin and glucagon) control the level of glucose in the blood. Insulin lowers blood glucose, and glucagon increases blood glucose. Hyperglycemia can result from having too little insulin in the bloodstream, or from the body not responding normally to insulin. Hyperglycemia occurs most often in people who have diabetes (diabetes mellitus), but it can happen in people who do not have diabetes. It can develop quickly, and it can be life-threatening if it causes you to become severely dehydrated (diabetic ketoacidosis or hyperglycemic hyperosmolar state). Severe hyperglycemia is a medical emergency. What are the causes? If you have diabetes, hyperglycemia may be caused by:  Diabetes medicine.  Medicines that increase blood glucose or affect your diabetes control.  Not eating enough, or not eating often enough.  Changes in physical activity level.  Being sick or having an infection. If you have prediabetes or undiagnosed diabetes:  Hyperglycemia may be caused by those conditions. If you do not have diabetes, hyperglycemia may be caused by:  Certain medicines, including steroid medicines, beta-blockers, epinephrine, and thiazide diuretics.  Stress.  Serious illness.  Surgery.  Diseases of the pancreas.  Infection. What increases the risk? Hyperglycemia is more likely to develop in people who have risk factors for diabetes, such as:  Having a family member with diabetes.  Having a gene for type 1 diabetes that is passed from parent to child (inherited).  Living in an area with cold weather conditions.  Exposure to certain viruses.  Certain conditions in which the body's disease-fighting (immune) system attacks itself (autoimmune disorders).  Being overweight or obese.  Having an inactive (sedentary) lifestyle.  Having been diagnosed with  insulin resistance.  Having a history of prediabetes, gestational diabetes, or polycystic ovarian syndrome (PCOS).  Being of American-Indian, African-American, Hispanic/Latino, or Asian/Pacific Islander descent. What are the signs or symptoms? Hyperglycemia may not cause any symptoms. If you do have symptoms, they may include early warning signs, such as:  Increased thirst.  Hunger.  Feeling very tired.  Needing to urinate more often than usual.  Blurry vision. Other symptoms may develop if hyperglycemia gets worse, such as:  Dry mouth.  Loss of appetite.  Fruity-smelling breath.  Weakness.  Unexpected or rapid weight gain or weight loss.  Tingling or numbness in the hands or feet.  Headache.  Skin that does not quickly return to normal after being lightly pinched and released (poor skin turgor).  Abdominal pain.  Cuts or bruises that are slow to heal. How is this diagnosed? Hyperglycemia is diagnosed with a blood test to measure your blood glucose level. This blood test  is usually done while you are having symptoms. Your health care provider may also do a physical exam and review your medical history. You may have more tests to determine the cause of your hyperglycemia, such as:  A fasting blood glucose (FBG) test. You will not be allowed to eat (you will fast) for at least 8 hours before a blood sample is taken.  An A1c (hemoglobin A1c) blood test. This provides information about blood glucose control over the previous 2-3 months.  An oral glucose tolerance test (OGTT). This measures your blood glucose at two times: ? After fasting. This is your baseline blood glucose level. ? Two hours after drinking a beverage that contains glucose. How is this treated? Treatment depends on the cause of your hyperglycemia. Treatment may include:  Taking medicine to regulate your blood glucose levels. If you take insulin or other diabetes medicines, your medicine or dosage may  be adjusted.  Lifestyle changes, such as exercising more, eating healthier foods, or losing weight.  Treating an illness or infection, if this caused your hyperglycemia.  Checking your blood glucose more often.  Stopping or reducing steroid medicines, if these caused your hyperglycemia. If your hyperglycemia becomes severe and it results in hyperglycemic hyperosmolar state, you must be hospitalized and given IV fluids. Follow these instructions at home:  General instructions  Take over-the-counter and prescription medicines only as told by your health care provider.  Do not use any products that contain nicotine or tobacco, such as cigarettes and e-cigarettes. If you need help quitting, ask your health care provider.  Limit alcohol intake to no more than 1 drink per day for nonpregnant women and 2 drinks per day for men. One drink equals 12 oz of beer, 5 oz of wine, or 1 oz of hard liquor.  Learn to manage stress. If you need help with this, ask your health care provider.  Keep all follow-up visits as told by your health care provider. This is important. Eating and drinking   Maintain a healthy weight.  Exercise regularly, as directed by your health care provider.  Stay hydrated, especially when you exercise, get sick, or spend time in hot temperatures.  Eat healthy foods, such as: ? Lean proteins. ? Complex carbohydrates. ? Fresh fruits and vegetables. ? Low-fat dairy products. ? Healthy fats.  Drink enough fluid to keep your urine clear or pale yellow. If you have diabetes:  Make sure you know the symptoms of hyperglycemia.  Follow your diabetes management plan, as told by your health care provider. Make sure you: ? Take your insulin and medicines as directed. ? Follow your exercise plan. ? Follow your meal plan. Eat on time, and do not skip meals. ? Check your blood glucose as often as directed. Make sure to check your blood glucose before and after exercise. If  you exercise longer or in a different way than usual, check your blood glucose more often. ? Follow your sick day plan whenever you cannot eat or drink normally. Make this plan in advance with your health care provider.  Share your diabetes management plan with people in your workplace, school, and household.  Check your urine for ketones when you are ill and as told by your health care provider.  Carry a medical alert card or wear medical alert jewelry. Contact a health care provider if:  Your blood glucose is at or above 240 mg/dL (13.3 mmol/L) for 2 days in a row.  You have problems keeping your blood glucose  in your target range.  You have frequent episodes of hyperglycemia. Get help right away if:  You have difficulty breathing.  You have a change in how you think, feel, or act (mental status).  You have nausea or vomiting that does not go away. These symptoms may represent a serious problem that is an emergency. Do not wait to see if the symptoms will go away. Get medical help right away. Call your local emergency services (911 in the [place] Do not drive yourself to the hospital. Summary  Hyperglycemia occurs when the level of sugar (glucose) in the blood is too high.  Hyperglycemia is diagnosed with a blood test to measure your blood glucose level. This blood test is usually done while you are having symptoms. Your health care provider may also do a physical exam and review your medical history.  If you have diabetes, follow your diabetes management plan as told by your health care provider.  Contact your health care provider if you have problems keeping your blood glucose in your target range. This information is not intended to replace advice given to you by your health care provider. Make sure you discuss any questions you have with your health care provider. Document Revised: 11/16/2015 Document Reviewed: 11/16/2015 Elsevier Patient Education  2020 Elsevier  Inc. Hemoglobin A1c Test Why am I having this test? You may have the hemoglobin A1c test (HbA1c test) done to:  Evaluate your risk for developing diabetes (diabetes mellitus).  Diagnose diabetes.  Monitor long-term control of blood sugar (glucose) in people who have diabetes and help make treatment decisions. This test may be done with other blood glucose tests, such as fasting blood glucose and oral glucose tolerance tests. What is being tested? Hemoglobin is a type of protein in the blood that carries oxygen. Glucose attaches to hemoglobin to form glycated hemoglobin. This test checks the amount of glycated hemoglobin in your blood, which is a good indicator of the average amount of glucose in your blood during the past 2-3 months. What kind of sample is taken?  A blood sample is required for this test. It is usually collected by inserting a needle into a blood vessel. Tell a health care provider about:  All medicines you are taking, including vitamins, herbs, eye drops, creams, and over-the-counter medicines.  Any blood disorders you have.  Any surgeries you have had.  Any medical conditions you have.  Whether you are pregnant or may be pregnant. How are the results reported? Your results will be reported as a percentage that indicates how much of your hemoglobin has glucose attached to it (is glycated). Your health care provider will compare your results to normal ranges that were established after testing a large group of people (reference ranges). Reference ranges may vary among labs and hospitals. For this test, common reference ranges are:  Adult or child without diabetes: 4-5.6%.  Adult or child with diabetes and good blood glucose control: less than 7%. What do the results mean? If you have diabetes:  A result of less than 7% is considered normal, meaning that your blood glucose is well controlled.  A result higher than 7% means that your blood glucose is not well  controlled, and your treatment plan may need to be adjusted. If you do not have diabetes:  A result within the reference range is considered normal, meaning that you are not at high risk for diabetes.  A result of 5.7-6.4% means that you have a high risk of developing  diabetes, and you may have prediabetes. Prediabetes is the condition of having a blood glucose level that is higher than it should be, but not high enough for you to be diagnosed with diabetes. Having prediabetes puts you at risk for developing type 2 diabetes (type 2 diabetes mellitus). You may have more tests, including a repeat HbA1c test.  Results of 6.5% or higher on two separate HbA1c tests mean that you have diabetes. You may have more tests to confirm the diagnosis. Abnormally low HbA1c values may be caused by:  Pregnancy.  Severe blood loss.  Receiving donated blood (transfusions).  Low red blood cell count (anemia).  Long-term kidney failure.  Some unusual forms (variants) of hemoglobin. Talk with your health care provider about what your results mean. Questions to ask your health care provider Ask your health care provider, or the department that is doing the test:  When will my results be ready?  How will I get my results?  What are my treatment options?  What other tests do I need?  What are my next steps? Summary  The hemoglobin A1c test (HbA1c test) may be done to evaluate your risk for developing diabetes, to diagnose diabetes, and to monitor long-term control of blood sugar (glucose) in people who have diabetes and help make treatment decisions.  Hemoglobin is a type of protein in the blood that carries oxygen. Glucose attaches to hemoglobin to form glycated hemoglobin. This test checks the amount of glycated hemoglobin in your blood, which is a good indicator of the average amount of glucose in your blood during the past 2-3 months.  Talk with your health care provider about what your results  mean. This information is not intended to replace advice given to you by your health care provider. Make sure you discuss any questions you have with your health care provider. Document Revised: 02/10/2017 Document Reviewed: 10/11/2016 Elsevier Patient Education  [place].

## 2020-01-10 NOTE — Progress Notes (Signed)
PROGRESS NOTE        PATIENT DETAILS Name: Deborah Mcclain Age: 49 y.o. Sex: female Date of Birth: [birth year] Admit Date: 01/09/2020 Admitting Physician Gertha Calkin, MD DUK:GURK, [place] Highland, MD  Brief Narrative: Patient is a 49 y.o. female with history of DM-2, HTN-who presented with polyuria/polydipsia, fatigue-was found to have nonketotic hyperosmolar hyperglycemic state-started on IV insulin-and admitted to the hospitalist service.  See below for further details.  Significant events: 10/28>> admit for severe hyperglycemia requiring insulin infusion   Significant studies: 10/28>> A1c 14.0  Antimicrobial therapy: None  Microbiology data: None  Procedures : None  Consults: None  DVT Prophylaxis : heparin injection 5,000 Units Start: 01/09/20 1630 SCDs Start: 01/09/20 1559  Subjective: Feels much better-denies any chest pain or shortness of breath.  Assessment/Plan: Nonketotic hyperosmolar hyperglycemic state: CBGs much better after starting IV insulin-transition to subcu insulin today.  Will start Lantus 28 units daily, 6 units of NovoLog with meals and SSI.  Plan to resume Metformin on discharge.  Needs extensive diabetic education.  History of poorly controlled DM-2: A1c significantly elevated-see above-apparently was diagnosed with DM-2 in March of this year-and just started on Lantus 13 units approximately a week back.  Will need continued optimization of insulin regimen in the outpatient setting by PCP.  HTN: BP controlled-continue with HCTZ, metoprolol  Hypokalemia: Secondary to HCTZ-IV insulin-replete and recheck  Shortness of breath: Apparently complained of shortness of breath on admission-I suspect this was secondary to hyperglycemia and fatigue-rather than any major cardiopulmonary issues.  Patient feels much better this morning-denies any shortness of breath to me.  Lungs are completely clear on exam.  Admitting MD has ordered an  echo-we will follow.  HLD: Continue statin  GERD: Continue PPI  History of abnormal uterine bleeding: Continue outpatient follow-up with OB/GYN.  Remains on Megace  History of anemia: Recently required PRBC transfusion-currently stable-continue outpatient follow-up with OB/PCP.  Morbid obesity: Counseled regarding importance of weight loss.   Diet: Diet Order            Diet Carb Modified Fluid consistency: Thin; Room service appropriate? Yes  Diet effective now                  Code Status: Full code   Family Communication: Patient to update family herself.  Disposition Plan: Status is: Inpatient  Remains inpatient appropriate because:Inpatient level of care appropriate due to severity of illness   Dispo: The patient is from: Home              Anticipated d/c is to: Home              Anticipated d/c date is: 1 day              Patient currently is not medically stable to d/c.   Barriers to Discharge: Severe hyperglycemia requiring IV insulin-being transitioned off to SQ insulin.  Potential discharge on 10/30 if sugars remain stable/acceptable.  Antimicrobial agents: Anti-infectives (From admission, onward)   None      Time spent: 25- minutes-Greater than 50% of this time was spent in counseling, explanation of diagnosis, planning of further management, and coordination of care.  MEDICATIONS: Scheduled Meds: . atorvastatin  40 mg Oral QHS  . fluticasone  2 spray Each Nare Daily  . heparin  5,000 Units Subcutaneous Q8H  . hydrochlorothiazide  25 mg Oral Daily  . insulin aspart  0-15 Units Subcutaneous TID WC  . insulin aspart  6 Units Subcutaneous TID WC  . insulin glargine  28 Units Subcutaneous Daily  . loratadine  10 mg Oral Daily  . megestrol  120 mg Oral Daily  . metoprolol tartrate  50 mg Oral Daily  . pantoprazole (PROTONIX) IV  40 mg Intravenous Q24H  . venlafaxine XR  75 mg Oral Daily   Continuous Infusions: . dextrose 5% lactated ringers      . dextrose 5% lactated ringers 125 mL/hr at 01/10/20 0642  . insulin 9.5 Units/hr (01/10/20 0831)  . lactated ringers Stopped (01/09/20 2232)   PRN Meds:.acetaminophen, dextrose   PHYSICAL EXAM: Vital signs: Vitals:   01/09/20 2143 01/10/20 0012 01/10/20 0333 01/10/20 0723  BP: (!) 145/86 126/70 133/76 110/73  Pulse: 83 78 87 88  Resp:  (!) 22 20 20   Temp: 98.9 F (37.2 C) 98.4 F (36.9 C) 98.6 F (37 C) 98.6 F (37 C)  TempSrc: Oral Oral Oral Oral  SpO2: 100% 100% 99% 99%  Weight:      Height:       Filed Weights   01/09/20 1328  Weight: 132.9 kg   Body mass index is 48.76 kg/m.   Gen Exam:Alert awake-not in any distress HEENT:atraumatic, normocephalic Chest: B/L clear to auscultation anteriorly CVS:S1S2 regular Abdomen:soft non tender, non distended Extremities:no edema Neurology: Non focal Skin: no rash  I have personally reviewed following labs and imaging studies  LABORATORY DATA: CBC: Recent Labs  Lab 01/09/20 1323 01/09/20 1333  WBC 9.2  --   HGB 12.5 13.9  HCT 39.7 41.0  MCV 81.5  --   PLT 446*  --     Basic Metabolic Panel: Recent Labs  Lab 01/09/20 1323 01/09/20 1333 01/09/20 1830 01/10/20 0033 01/10/20 0814  NA 132* 132*  --  138 140  K 4.1 4.1  --  3.3* 3.3*  CL 96*  --   --  106 107  CO2 20*  --   --  21* 23  GLUCOSE 754*  --   --  252* 179*  BUN 20  --   --  18 15  CREATININE 1.71*  --   --  1.35* 1.29*  CALCIUM 10.3  --   --  9.7 9.5  MG  --   --  2.5*  --   --   PHOS  --   --  2.5  --   --     GFR: Estimated Creatinine Clearance: 71.2 mL/min (A) (by C-G formula based on SCr of 1.29 mg/dL (H)).  Liver Function Tests: No results for input(s): AST, ALT, ALKPHOS, BILITOT, PROT, ALBUMIN in the last 168 hours. No results for input(s): LIPASE, AMYLASE in the last 168 hours. No results for input(s): AMMONIA in the last 168 hours.  Coagulation Profile: No results for input(s): INR, PROTIME in the last 168 hours.  Cardiac  Enzymes: No results for input(s): CKTOTAL, CKMB, CKMBINDEX, TROPONINI in the last 168 hours.  BNP (last 3 results) No results for input(s): PROBNP in the last 8760 hours.  Lipid Profile: Recent Labs    01/09/20 1830  CHOL 230*  HDL 28*  LDLCALC 129*  TRIG 363*  CHOLHDL 8.2    Thyroid Function Tests: Recent Labs    01/09/20 1830  TSH 0.445  FREET4 0.98    Anemia Panel: No results for input(s): VITAMINB12, FOLATE, FERRITIN, TIBC, IRON, RETICCTPCT in the last  72 hours.  Urine analysis:    Component Value Date/Time   COLORURINE STRAW (A) 01/09/2020 1323   APPEARANCEUR HAZY (A) 01/09/2020 1323   LABSPEC 1.029 01/09/2020 1323   PHURINE 6.0 01/09/2020 1323   GLUCOSEU >=500 (A) 01/09/2020 1323   HGBUR SMALL (A) 01/09/2020 1323   BILIRUBINUR NEGATIVE 01/09/2020 1323   KETONESUR NEGATIVE 01/09/2020 1323   PROTEINUR 30 (A) 01/09/2020 1323   NITRITE NEGATIVE 01/09/2020 1323   LEUKOCYTESUR MODERATE (A) 01/09/2020 1323    Sepsis Labs: Lactic Acid, Venous No results found for: LATICACIDVEN  MICROBIOLOGY: Recent Results (from the past 240 hour(s))  Respiratory Panel by RT PCR (Flu A&B, Covid) - Nasopharyngeal Swab     Status: None   Collection Time: 01/09/20  1:55 PM   Specimen: Nasopharyngeal Swab  Result Value Ref Range Status   SARS Coronavirus 2 by RT PCR NEGATIVE NEGATIVE Final    Comment: (NOTE) SARS-CoV-2 target nucleic acids are NOT DETECTED.  The SARS-CoV-2 RNA is generally detectable in upper respiratoy specimens during the acute phase of infection. The lowest concentration of SARS-CoV-2 viral copies this assay can detect is 131 copies/mL. A negative result does not preclude SARS-Cov-2 infection and should not be used as the sole basis for treatment or other patient management decisions. A negative result may occur with  improper specimen collection/handling, submission of specimen other than nasopharyngeal swab, presence of viral mutation(s) within  the areas targeted by this assay, and inadequate number of viral copies (<131 copies/mL). A negative result must be combined with clinical observations, patient history, and epidemiological information. The expected result is Negative.  Fact Sheet for Patients:  https://www.moore.com/https://www.fda.gov/media/142436/download  Fact Sheet for Healthcare Providers:  https://www.young.biz/https://www.fda.gov/media/142435/download  This test is no t yet approved or cleared by the [place] FDA and  has been authorized for detection and/or diagnosis of SARS-CoV-2 by FDA under an Emergency Use Authorization (EUA). This EUA will remain  in effect (meaning this test can be used) for the duration of the COVID-19 declaration under Section 564(b)(1) of the Act, 21 U.S.C. section 360bbb-3(b)(1), unless the authorization is terminated or revoked sooner.     Influenza A by PCR NEGATIVE NEGATIVE Final   Influenza B by PCR NEGATIVE NEGATIVE Final    Comment: (NOTE) The Xpert Xpress SARS-CoV-2/FLU/RSV assay is intended as an aid in  the diagnosis of influenza from Nasopharyngeal swab specimens and  should not be used as a sole basis for treatment. Nasal washings and  aspirates are unacceptable for Xpert Xpress SARS-CoV-2/FLU/RSV  testing.  Fact Sheet for Patients: https://www.moore.com/https://www.fda.gov/media/142436/download  Fact Sheet for Healthcare Providers: https://www.young.biz/https://www.fda.gov/media/142435/download  This test is not yet approved or cleared by the [place] FDA and  has been authorized for detection and/or diagnosis of SARS-CoV-2 by  FDA under an Emergency Use Authorization (EUA). This EUA will remain  in effect (meaning this test can be used) for the duration of the  Covid-19 declaration under Section 564(b)(1) of the Act, 21  U.S.C. section 360bbb-3(b)(1), unless the authorization is  terminated or revoked. Performed at [facility], [address] [place], [place] [address]     RADIOLOGY STUDIES/RESULTS: No results found.    LOS: 1 day   Jeoffrey MassedShanker Kimm Ungaro, MD  [facility]    To contact the attending provider between 7A-7P or the covering provider during after hours 7P-7A, please log into the web site [email or web address] and access using universal [place] password for that web site. If you do not have the password, please  call the hospital operator.  01/10/2020, 9:29 AM

## 2020-01-10 NOTE — Progress Notes (Signed)
°  Echocardiogram 2D Echocardiogram with definity has been performed.  Leta Jungling M 01/10/2020, 10:42 AM

## 2020-01-11 LAB — GLUCOSE, CAPILLARY
Glucose-Capillary: 411 mg/dL — ABNORMAL HIGH (ref 70–99)
Glucose-Capillary: 368 mg/dL — ABNORMAL HIGH (ref 70–99)
Glucose-Capillary: 324 mg/dL — ABNORMAL HIGH (ref 70–99)
Glucose-Capillary: 438 mg/dL — ABNORMAL HIGH (ref 70–99)
Glucose-Capillary: 403 mg/dL — ABNORMAL HIGH (ref 70–99)
Glucose-Capillary: 391 mg/dL — ABNORMAL HIGH (ref 70–99)

## 2020-01-11 LAB — BASIC METABOLIC PANEL
Anion gap: 12 (ref 5–15)
BUN: 14 mg/dL (ref 6–20)
CO2: 19 mmol/L — ABNORMAL LOW (ref 22–32)
Calcium: 9.5 mg/dL (ref 8.9–10.3)
Chloride: 104 mmol/L (ref 98–111)
Creatinine, Ser: 1.37 mg/dL — ABNORMAL HIGH (ref 0.44–1.00)
GFR, Estimated: 47 mL/min — ABNORMAL LOW (ref >60)
Glucose, Bld: 398 mg/dL — ABNORMAL HIGH (ref 70–99)
Potassium: 3.8 mmol/L (ref 3.5–5.1)
Sodium: 135 mmol/L (ref 135–145)

## 2020-01-11 MED ORDER — INSULIN ASPART 100 UNIT/ML ~~LOC~~ SOLN
14.0000 [IU] | Freq: Three times a day (TID) | SUBCUTANEOUS | Status: DC
  Administered 2020-01-11 – 2020-01-12 (×2): 14 [IU] via SUBCUTANEOUS

## 2020-01-11 MED ORDER — INSULIN GLARGINE 100 UNIT/ML ~~LOC~~ SOLN
34.0000 [IU] | Freq: Every day | SUBCUTANEOUS | Status: DC
  Administered 2020-01-11: 34 [IU] via SUBCUTANEOUS
  Filled 2020-01-11 (×2): qty 0.34

## 2020-01-11 MED ORDER — INSULIN ASPART 100 UNIT/ML ~~LOC~~ SOLN
10.0000 [IU] | Freq: Once | SUBCUTANEOUS | Status: AC
  Administered 2020-01-11: 10 [IU] via SUBCUTANEOUS

## 2020-01-11 MED ORDER — INSULIN GLARGINE 100 UNIT/ML ~~LOC~~ SOLN
22.0000 [IU] | Freq: Every day | SUBCUTANEOUS | Status: DC
  Filled 2020-01-11: qty 0.22

## 2020-01-11 MED ORDER — INSULIN GLARGINE 100 UNIT/ML ~~LOC~~ SOLN
25.0000 [IU] | Freq: Every day | SUBCUTANEOUS | Status: DC
  Filled 2020-01-11: qty 0.25

## 2020-01-11 MED ORDER — INSULIN ASPART 100 UNIT/ML ~~LOC~~ SOLN
12.0000 [IU] | Freq: Three times a day (TID) | SUBCUTANEOUS | Status: DC
  Administered 2020-01-11 (×2): 12 [IU] via SUBCUTANEOUS

## 2020-01-11 MED ORDER — INSULIN ASPART 100 UNIT/ML ~~LOC~~ SOLN: 10.0000 [IU] | Freq: Once | SUBCUTANEOUS | Status: DC

## 2020-01-11 MED ORDER — INSULIN GLARGINE 100 UNIT/ML ~~LOC~~ SOLN
30.0000 [IU] | Freq: Every day | SUBCUTANEOUS | Status: DC
  Administered 2020-01-11: 30 [IU] via SUBCUTANEOUS
  Filled 2020-01-11 (×2): qty 0.3

## 2020-01-11 MED ORDER — METOPROLOL TARTRATE 50 MG PO TABS
50.0000 mg | ORAL_TABLET | Freq: Two times a day (BID) | ORAL | Status: DC
  Administered 2020-01-11 – 2020-01-12 (×2): 50 mg via ORAL
  Filled 2020-01-11 (×2): qty 1

## 2020-01-11 NOTE — Progress Notes (Signed)
CBG at HS 430 on call provider notified,  coverage order administered.  CBG rechecked at 0323 resulting 411, on call provider contacted and place a second order for coverage. Will recheck CBG in 1.5 hours per provider request.

## 2020-01-11 NOTE — Progress Notes (Signed)
Inpatient Diabetes Program Recommendations  AACE/ADA: New Consensus Statement on Inpatient Glycemic Control   Target Ranges:  Prepandial:   less than 140 mg/dL      Peak postprandial:   less than 180 mg/dL (1-2 hours)      Critically ill patients:  140 - 180 mg/dL   Results for [name], Deborah Mcclain (MRN [id number]) as of 01/11/2020 11:50  Ref. Range 01/10/2020 08:31 01/10/2020 10:33 01/10/2020 11:19 01/10/2020 16:40 01/10/2020 21:08 01/11/2020 03:23 01/11/2020 05:16 01/11/2020 06:14 01/11/2020 11:24  Glucose-Capillary Latest Ref Range: 70 - 99 mg/dL 312 (H) 811 (H) 886 (H) 324 (H) 430 (H) 411 (H) 368 (H) 324 (H) 438 (H)   Review of Glycemic Control  Diabetes history: New DM dx Outpatient Diabetes medications: Lantus 13 units QHS (added this week at [facility]), Metformin 1000 mg BID Current orders for Inpatient glycemic control: Lantus 34 units QAM, Lantus 22 units QHS, Novolog 12 units TID with meals, Novolog 0-20 units TID with meals  Inpatient Diabetes Program Recommendations:    Insulin: Please consider adding Novolog 0-5 units QHS for bedtime correction. Noted Lantus and meal coverage increased today.  Thanks, Orlando Penner, RN, MSN, CDE Diabetes Coordinator Inpatient Diabetes Program [phone] (Team Pager from 8am to 5pm)

## 2020-01-11 NOTE — Progress Notes (Signed)
PROGRESS NOTE        PATIENT DETAILS Name: Deborah Mcclain Age: 49 y.o. Sex: female Date of Birth: May 16, 1970 Admit Date: 01/09/2020 Admitting Physician Gertha CalkinEkta Patel V, MD RUE:AVWUPCP:[name], [name], MD  Brief Narrative: Patient is a 49 y.o. female with history of DM-2, HTN-who presented with polyuria/polydipsia, fatigue-was found to have nonketotic hyperosmolar hyperglycemic state-started on IV insulin-and admitted to the hospitalist service.  See below for further details.  Significant events: 10/28>> admit for severe hyperglycemia requiring insulin infusion   Significant studies: 10/28>> A1c 14.0  Antimicrobial therapy: None  Microbiology data: None  Procedures : None  Consults: None  DVT Prophylaxis : heparin injection 5,000 Units Start: 01/09/20 1630 SCDs Start: 01/09/20 1559  Subjective: Wanting to go home-plan was for discharge today but patient started having tachycardic spells with ambulation.  CBGs still above 400 at times.  No nausea vomiting-no chest pain.  Assessment/Plan: Nonketotic hyperosmolar hyperglycemic state: Improved-still with hypoglycemia but of insulin infusion.  See below.  History of poorly controlled DM-2: Remains with significant hyperglycemia-increase Lantus to 24 units in a.m., 25 units in p.m. increase Premeal NovoLog to 12 units with meals-remains on resistant SSI.  Follow and adjust accordingly.    HTN: BP controlled-stop HCTZ-change metoprolol to twice daily dosing (tachycardic with ambulation)  Sinus tachycardia: Tachycardic with ambulation-probably secondary to acute illness/hypoglycemia-change metoprolol to twice daily dosing-continue telemetry monitoring-continue to treat hypoglycemia-see how is she does.  Echo with preserved EF-TSH within normal limits.  Follow closely.  Hypokalemia: Repleted.  Shortness of breath: Resolved-probably was more fatigue due to hyperglycemia-echo was within normal limits.  D-dimer was  negative.  HLD: Continue statin  GERD: Continue PPI  History of abnormal uterine bleeding: Continue outpatient follow-up with OB/GYN.  Remains on Megace  History of anemia: Recently required PRBC transfusion-currently stable-continue outpatient follow-up with OB/PCP.  Morbid obesity: Counseled regarding importance of weight loss.   Diet: Diet Order            Diet Carb Modified Fluid consistency: Thin; Room service appropriate? Yes  Diet effective now                  Code Status: Full code   Family Communication: Patient to update family herself.  Disposition Plan: Status is: Inpatient  Remains inpatient appropriate because:Inpatient level of care appropriate due to severity of illness   Dispo: The patient is from: Home              Anticipated d/c is to: Home              Anticipated d/c date is: 1 day              Patient currently is not medically stable to d/c.   Barriers to Discharge: Tachycardia with minimal ambulation-CBGs remain above 400.  Antimicrobial agents: Anti-infectives (From admission, onward)   None      Time spent: 25- minutes-Greater than 50% of this time was spent in counseling, explanation of diagnosis, planning of further management, and coordination of care.  MEDICATIONS: Scheduled Meds: . atorvastatin  40 mg Oral QHS  . fluticasone  2 spray Each Nare Daily  . heparin  5,000 Units Subcutaneous Q8H  . hydrochlorothiazide  25 mg Oral Daily  . insulin aspart  0-20 Units Subcutaneous TID WC  . insulin aspart  12 Units Subcutaneous TID WC  .  insulin glargine  22 Units Subcutaneous QHS  . insulin glargine  34 Units Subcutaneous Daily  . loratadine  10 mg Oral Daily  . megestrol  120 mg Oral Daily  . metoprolol tartrate  50 mg Oral Daily  . pantoprazole  40 mg Oral Daily  . venlafaxine XR  75 mg Oral Daily   Continuous Infusions:  PRN Meds:.acetaminophen, dextrose   PHYSICAL EXAM: Vital signs: Vitals:   01/10/20 2316  01/11/20 0316 01/11/20 0718 01/11/20 1126  BP: 127/82 (!) 146/77 140/88 111/69  Pulse: 86 97 (!) 102 87  Resp: Temp: 99.1 F (37.3 C) 98.7 F (37.1 C) 98.7 F (37.1 C) 97.8 F (36.6 C)  TempSrc: Oral Oral Oral Oral  SpO2: 100% 100% 100% 97%  Weight:      Height:       Filed Weights   01/09/20 1328  Weight: 132.9 kg   Body mass index is 48.76 kg/m.   Gen Exam:Alert awake-not in any distress HEENT:atraumatic, normocephalic Chest: B/L clear to auscultation anteriorly CVS:S1S2 regular Abdomen:soft non tender, non distended Extremities:no edema Neurology: Non focal Skin: no rash  I have personally reviewed following labs and imaging studies  LABORATORY DATA: CBC: Recent Labs  Lab 01/09/20 1323 01/09/20 1333  WBC 9.2  --   HGB 12.5 13.9  HCT 39.7 41.0  MCV 81.5  --   PLT 446*  --     Basic Metabolic Panel: Recent Labs  Lab 01/09/20 1323 01/09/20 1333 01/09/20 1830 01/10/20 0033 01/10/20 0814 01/11/20 0318  NA 132* 132*  --  138 140 135  K 4.1 4.1  --  3.3* 3.3* 3.8  CL 96*  --   --  106 107 104  CO2 20*  --   --  21* 23 19*  GLUCOSE 754*  --   --  252* 179* 398*  BUN 20  --   --  CREATININE 1.71*  --   --  1.35* 1.29* 1.37*  CALCIUM 10.3  --   --  9.7 9.5 9.5  MG  --   --  2.5*  --   --   --   PHOS  --   --  2.5  --   --   --     GFR: Estimated Creatinine Clearance: 67 mL/min (A) (by C-G formula based on SCr of 1.37 mg/dL (H)).  Liver Function Tests: No results for input(s): AST, ALT, ALKPHOS, BILITOT, PROT, ALBUMIN in the last 168 hours. No results for input(s): LIPASE, AMYLASE in the last 168 hours. No results for input(s): AMMONIA in the last 168 hours.  Coagulation Profile: No results for input(s): INR, PROTIME in the last 168 hours.  Cardiac Enzymes: No results for input(s): CKTOTAL, CKMB, CKMBINDEX, TROPONINI in the last 168 hours.  BNP (last 3 results) No results for input(s): PROBNP in the last 8760  hours.  Lipid Profile: Recent Labs    01/09/20 1830  CHOL 230*  HDL 28*  LDLCALC 129*  TRIG 363*  CHOLHDL 8.2    Thyroid Function Tests: Recent Labs    01/09/20 1830  TSH 0.445  FREET4 0.98    Anemia Panel: No results for input(s): VITAMINB12, FOLATE, FERRITIN, TIBC, IRON, RETICCTPCT in the last 72 hours.  Urine analysis:    Component Value Date/Time   COLORURINE STRAW (A) 01/09/2020 1323   APPEARANCEUR HAZY (A) 01/09/2020 1323   LABSPEC 1.029 01/09/2020 1323   PHURINE 6.0 01/09/2020 1323  GLUCOSEU >=500 (A) 01/09/2020 1323   HGBUR SMALL (A) 01/09/2020 1323   BILIRUBINUR NEGATIVE 01/09/2020 1323   KETONESUR NEGATIVE 01/09/2020 1323   PROTEINUR 30 (A) 01/09/2020 1323   NITRITE NEGATIVE 01/09/2020 1323   LEUKOCYTESUR MODERATE (A) 01/09/2020 1323    Sepsis Labs: Lactic Acid, Venous No results found for: LATICACIDVEN  MICROBIOLOGY: Recent Results (from the past 240 hour(s))  Respiratory Panel by RT PCR (Flu A&B, Covid) - Nasopharyngeal Swab     Status: None   Collection Time: 01/09/20  1:55 PM   Specimen: Nasopharyngeal Swab  Result Value Ref Range Status   SARS Coronavirus 2 by RT PCR NEGATIVE NEGATIVE Final    Comment: (NOTE) SARS-CoV-2 target nucleic acids are NOT DETECTED.  The SARS-CoV-2 RNA is generally detectable in upper respiratoy specimens during the acute phase of infection. The lowest concentration of SARS-CoV-2 viral copies this assay can detect is 131 copies/mL. A negative result does not preclude SARS-Cov-2 infection and should not be used as the sole basis for treatment or other patient management decisions. A negative result may occur with  improper specimen collection/handling, submission of specimen other than nasopharyngeal swab, presence of viral mutation(s) within the areas targeted by this assay, and inadequate number of viral copies (<131 copies/mL). A negative result must be combined with clinical observations, patient history, and  epidemiological information. The expected result is Negative.  Fact Sheet for Patients:  https://www.moore.com/  Fact Sheet for Healthcare Providers:  https://www.young.biz/  This test is no t yet approved or cleared by the [place] FDA and  has been authorized for detection and/or diagnosis of SARS-CoV-2 by FDA under an Emergency Use Authorization (EUA). This EUA will remain  in effect (meaning this test can be used) for the duration of the COVID-19 declaration under Section 564(b)(1) of the Act, 21 U.S.C. section 360bbb-3(b)(1), unless the authorization is terminated or revoked sooner.     Influenza A by PCR NEGATIVE NEGATIVE Final   Influenza B by PCR NEGATIVE NEGATIVE Final    Comment: (NOTE) The Xpert Xpress SARS-CoV-2/FLU/RSV assay is intended as an aid in  the diagnosis of influenza from Nasopharyngeal swab specimens and  should not be used as a sole basis for treatment. Nasal washings and  aspirates are unacceptable for Xpert Xpress SARS-CoV-2/FLU/RSV  testing.  Fact Sheet for Patients: https://www.moore.com/  Fact Sheet for Healthcare Providers: https://www.young.biz/  This test is not yet approved or cleared by the [place] FDA and  has been authorized for detection and/or diagnosis of SARS-CoV-2 by  FDA under an Emergency Use Authorization (EUA). This EUA will remain  in effect (meaning this test can be used) for the duration of the  Covid-19 declaration under Section 564(b)(1) of the Act, 21  U.S.C. section 360bbb-3(b)(1), unless the authorization is  terminated or revoked. Performed at [facility], [address], [place], [place] [address]     RADIOLOGY STUDIES/RESULTS: ECHOCARDIOGRAM COMPLETE  Result Date: 01/10/2020    ECHOCARDIOGRAM REPORT   Patient Name:   Deborah Mcclain Date of Exam: 01/10/2020 Medical Rec #:  [id number]      Height:       65.0 in Accession  #:    [id number]     Weight:       293.0 lb Date of Birth:  [birth year]      BSA:          2.327 m Patient Age:    49 years       BP:  110/73 mmHg Patient Gender: F              HR:           105 bpm. Exam Location:  Inpatient Procedure: 2D Echo and Intracardiac Opacification Agent Indications:    SOB (shortness of breath)  History:        Patient has no prior history of Echocardiogram examinations.                 Risk Factors:Diabetes and Hypertension. Asthma.  Sonographer:    Leta Jungling RDCS Referring Phys: [phone] EKTA V PATEL IMPRESSIONS  1. Left ventricular ejection fraction, by estimation, is 65 to 70%. Left ventricular ejection fraction by 2D MOD biplane is 68.0 %. The left ventricle has normal function. The left ventricle has no regional wall motion abnormalities. There is mild concentric left ventricular hypertrophy. Left ventricular diastolic parameters were normal.  2. Right ventricular systolic function is normal. The right ventricular size is normal.  3. The mitral valve is grossly normal. No evidence of mitral valve regurgitation. No evidence of mitral stenosis.  4. The aortic valve is tricuspid. Aortic valve regurgitation is not visualized. No aortic stenosis is present. Comparison(s): No prior Echocardiogram. FINDINGS  Left Ventricle: Left ventricular ejection fraction, by estimation, is 65 to 70%. Left ventricular ejection fraction by 2D MOD biplane is 68.0 %. The left ventricle has normal function. The left ventricle has no regional wall motion abnormalities. Definity contrast agent was given IV to delineate the left ventricular endocardial borders. The left ventricular internal cavity size was normal in size. There is mild concentric left ventricular hypertrophy. Left ventricular diastolic parameters were normal. Right Ventricle: The right ventricular size is normal. No increase in right ventricular wall thickness. Right ventricular systolic function is normal. Left Atrium: Left atrial  size was normal in size. Right Atrium: Right atrial size was normal in size. Pericardium: Trivial pericardial effusion is present. Presence of pericardial fat pad. Mitral Valve: The mitral valve is grossly normal. No evidence of mitral valve regurgitation. No evidence of mitral valve stenosis. Tricuspid Valve: The tricuspid valve is grossly normal. Tricuspid valve regurgitation is trivial. No evidence of tricuspid stenosis. Aortic Valve: The aortic valve is tricuspid. Aortic valve regurgitation is not visualized. No aortic stenosis is present. Pulmonic Valve: The pulmonic valve was grossly normal. Pulmonic valve regurgitation is not visualized. No evidence of pulmonic stenosis. Aorta: The aortic root and ascending aorta are structurally normal, with no evidence of dilitation. Venous: The inferior vena cava was not well visualized. IAS/Shunts: The atrial septum is grossly normal.  LEFT VENTRICLE PLAX 2D                        Biplane EF (MOD) LVIDd:         4.40 cm         LV Biplane EF:   Left LVIDs:         2.40 cm                          ventricular LV PW:         1.20 cm                          ejection LV IVS:        1.50 cm  fraction by LVOT diam:     1.80 cm                          2D MOD LV SV:         39                               biplane is LV SV Index:   17                               68.0 %. LVOT Area:     2.54 cm                                Diastology                                LV e' medial:    6.31 cm/s LV Volumes (MOD)               LV E/e' medial:  6.6 LV vol d, MOD    82.2 ml       LV e' lateral:   12.90 cm/s A2C:                           LV E/e' lateral: 3.2 LV vol d, MOD    125.0 ml A4C: LV vol s, MOD    29.5 ml A2C: LV vol s, MOD    35.7 ml A4C: LV SV MOD A2C:   52.7 ml LV SV MOD A4C:   125.0 ml LV SV MOD BP:    69.6 ml RIGHT VENTRICLE RV S prime:     13.70 cm/s TAPSE (M-mode): 2.1 cm LEFT ATRIUM           Index LA diam:      4.50 cm 1.93 cm/m LA Vol  (A2C): 18.6 ml 7.99 ml/m LA Vol (A4C): 23.8 ml 10.23 ml/m  AORTIC VALVE LVOT Vmax:   120.00 cm/s LVOT Vmean:  78.600 cm/s LVOT VTI:    0.154 m  AORTA Ao Root diam: 3.10 cm Ao Asc diam:  3.00 cm MITRAL VALVE               TRICUSPID VALVE MV Area (PHT): 2.24 cm    TR Peak grad:   11.3 mmHg MV Decel Time: 338 msec    TR Vmax:        168.00 cm/s MV E velocity: 41.60 cm/s MV A velocity: 42.40 cm/s  SHUNTS MV E/A ratio:  0.98        Systemic VTI:  0.15 m                            Systemic Diam: 1.80 cm Lennie Odor MD Electronically signed by Lennie Odor MD Signature Date/Time: 01/10/2020/2:01:06 PM    Final      LOS: 2 days   Jeoffrey Massed, MD  [facility]    To contact the attending provider between 7A-7P or the covering provider during after hours 7P-7A, please log into the web site [email or web address] and access using universal [place] password for that web site. If you do not  have the password, please call the hospital operator.  01/11/2020, 1:18 PM

## 2020-01-11 NOTE — Progress Notes (Signed)
MD notified of BS >400

## 2020-01-12 DIAGNOSIS — E1169 Type 2 diabetes mellitus with other specified complication: Secondary | ICD-10-CM

## 2020-01-12 DIAGNOSIS — E669 Obesity, unspecified: Secondary | ICD-10-CM

## 2020-01-12 LAB — GLUCOSE, CAPILLARY
Glucose-Capillary: 303 mg/dL — ABNORMAL HIGH (ref 70–99)
Glucose-Capillary: 315 mg/dL — ABNORMAL HIGH (ref 70–99)

## 2020-01-12 LAB — BASIC METABOLIC PANEL
Anion gap: 10 (ref 5–15)
BUN: 12 mg/dL (ref 6–20)
CO2: 20 mmol/L — ABNORMAL LOW (ref 22–32)
Calcium: 9.4 mg/dL (ref 8.9–10.3)
Chloride: 104 mmol/L (ref 98–111)
Creatinine, Ser: 1.21 mg/dL — ABNORMAL HIGH (ref 0.44–1.00)
GFR, Estimated: 54 mL/min — ABNORMAL LOW (ref >60)
Glucose, Bld: 332 mg/dL — ABNORMAL HIGH (ref 70–99)
Potassium: 3.4 mmol/L — ABNORMAL LOW (ref 3.5–5.1)
Sodium: 134 mmol/L — ABNORMAL LOW (ref 135–145)

## 2020-01-12 MED ORDER — ATORVASTATIN CALCIUM 40 MG PO TABS: 40.0000 mg | ORAL_TABLET | Freq: Every day | ORAL | 0 refills | Status: AC

## 2020-01-12 MED ORDER — LANTUS SOLOSTAR 100 UNIT/ML ~~LOC~~ SOPN
PEN_INJECTOR | SUBCUTANEOUS | 11 refills | Status: DC
Start: 2020-01-12 — End: 2021-12-03

## 2020-01-12 MED ORDER — INSULIN PEN NEEDLE 32G X 4 MM MISC: 1.0000 | 0 refills | Status: DC | PRN

## 2020-01-12 MED ORDER — INSULIN GLARGINE 100 UNIT/ML ~~LOC~~ SOLN
36.0000 [IU] | Freq: Every day | SUBCUTANEOUS | Status: DC
  Filled 2020-01-12: qty 0.36

## 2020-01-12 MED ORDER — POTASSIUM CHLORIDE CRYS ER 20 MEQ PO TBCR
40.0000 meq | EXTENDED_RELEASE_TABLET | Freq: Once | ORAL | Status: AC
  Administered 2020-01-12: 40 meq via ORAL
  Filled 2020-01-12: qty 2

## 2020-01-12 MED ORDER — INSULIN ASPART 100 UNIT/ML ~~LOC~~ SOLN
16.0000 [IU] | Freq: Three times a day (TID) | SUBCUTANEOUS | Status: DC
  Administered 2020-01-12: 16 [IU] via SUBCUTANEOUS

## 2020-01-12 MED ORDER — METOPROLOL TARTRATE 50 MG PO TABS: 50.0000 mg | ORAL_TABLET | Freq: Two times a day (BID) | ORAL | 0 refills | Status: AC

## 2020-01-12 MED ORDER — INSULIN ASPART 100 UNIT/ML FLEXPEN: PEN_INJECTOR | SUBCUTANEOUS | 11 refills | Status: DC

## 2020-01-12 MED ORDER — INSULIN GLARGINE 100 UNIT/ML ~~LOC~~ SOLN
46.0000 [IU] | Freq: Every day | SUBCUTANEOUS | Status: DC
  Administered 2020-01-12: 46 [IU] via SUBCUTANEOUS
  Filled 2020-01-12: qty 0.46

## 2020-01-12 NOTE — Progress Notes (Signed)
Inpatient Diabetes Program Recommendations  AACE/ADA: New Consensus Statement on Inpatient Glycemic Control  Target Ranges:  Prepandial:   less than 140 mg/dL      Peak postprandial:   less than 180 mg/dL (1-2 hours)      Critically ill patients:  140 - 180 mg/dL   Results for Deborah Mcclain, Deborah Mcclain (MRN [id number]) as of 01/12/2020 10:03  Ref. Range 01/11/2020 06:14 01/11/2020 11:24 01/11/2020 16:28 01/11/2020 21:54 01/12/2020 06:14  Glucose-Capillary Latest Ref Range: 70 - 99 mg/dL 449 (H) 675 (H) 916 (H) 391 (H) 303 (H)   Review of Glycemic Control  Diabetes history: New DM dx Outpatient Diabetes medications: Lantus 13 units QHS (added this week at [facility]), Metformin 1000 mg BID Current orders for Inpatient glycemic control: Lantus 46 units QAM, Lantus 36 units QHS, Novolog 16 units TID with meals, Novolog 0-20 units TID with meals  NOTE: Received a page from Dr. Jerral Ralph regarding reinforcing insulin regimen and education. Called patient over the phone. Discussed current insulin regimen and explained that insulin dosages were increased today. Discussed insulin regimen that she will be discharged on. Reviewed Novolog correction scale and discussed how insulin regimen is used. Patient reports that nursing staff reviewed it with her as well yesterday. Asked patient to be sure to check glucose at least AC&HS and to take Novolog correction at least 15 minutes before she eats meals.  Asked patient to call PCP office in AM and get appointment for follow up for this week. Asked patient to take a written log of glucose or take glucometer with her to follow up appointments.  Patient verbalized understanding of information discussed and reports that she has no questions at this time.  Thanks, Orlando Penner, RN, MSN, CDE Diabetes Coordinator Inpatient Diabetes Program [phone] (Team Pager from 8am to 5pm)

## 2020-01-12 NOTE — Discharge Summary (Addendum)
PATIENT DETAILS Name: Deborah Mcclain Age: 49 y.o. Sex: female Date of Birth: [birth year] MRN: [id number]. Admitting Physician: Gertha Calkin, MD WUJ:[id number], Fredonia Highland, MD  Admit Date: 01/09/2020 Discharge date: 01/12/2020  Recommendations for Outpatient Follow-up:  1. Follow up with PCP in 1-2 weeks 2. Please obtain CMP/CBC in one week 3. Please continue to optimize glycemic control.  Admitted From:  Home  Disposition: Home   Home Health: No  Equipment/Devices: None  Discharge Condition: Stable  CODE STATUS: FULL CODE  Diet recommendation:  Diet Order            Diet - low sodium heart healthy           Diet Carb Modified           Diet Carb Modified Fluid consistency: Thin; Room service appropriate? Yes  Diet effective now                  Brief Narrative: Patient is a 49 y.o. female with history of DM-2, HTN-who presented with polyuria/polydipsia, fatigue-was found to have nonketotic hyperosmolar hyperglycemic state-started on IV insulin-and admitted to the hospitalist service.  See below for further details.  Significant events: 10/28>> admit for severe hyperglycemia requiring insulin infusion   Significant studies: 10/28>> A1c 14.0  Antimicrobial therapy: None  Microbiology data: None  Procedures : None  Consults: None  Brief Hospital Course: Nonketotic hyperosmolar hyperglycemic state: Improved-treated with insulin infusion.  See below.  History of poorly controlled DM-2:  CBGs still on the higher side-but slowly improving-Lantus/glycemic regimen has been slowly adjusted-on discharge-she will continue Lantus 46 units in a.m., and 36 units in p.m. she will placed on sliding scale insulin-extensive counseling has been done-she is aware of the symptoms of hypoglycemia and the needed interventions.  She has been asked to keep a record of her CBGs-and follow with PCP for further optimization.     Recent Labs    01/11/20 1628  01/11/20 2154 01/12/20 0614  GLUCAP 403* 391* 303*    HTN: BP controlled-continue metoprolol-continue to hold HCTZ-follow with PCP.  Sinus tachycardia: Had sinus tachycardia with ambulation on 10/30-tachycardia has resolved-this MD ambulated the patient today-no longer tachycardic.  Beta-blocker dose adjusted.  TSH within normal limits.  Echo within normal limits.  Feels better-and is requesting discharge.  Suspect sinus tachycardia was secondary to hyperglycemia/osmotic diuresis/acute illness/probably mild dehydration due to HCTZ.  Hypokalemia: Replete.  Shortness of breath: Resolved-probably was more fatigue due to hyperglycemia-echo was within normal limits.  D-dimer was negative.  HLD: Continue statin  GERD: Continue PPI  History of abnormal uterine bleeding: Continue outpatient follow-up with OB/GYN.    History of anemia: Recently required PRBC transfusion-currently stable-continue outpatient follow-up with OB/PCP.  Morbid obesity: Counseled regarding importance of weight loss.   Discharge Diagnoses:  Active Problems:   Essential hypertension   Morbid obesity with BMI of 50.0-59.9, adult (HCC)   Menopausal vasomotor syndrome   Anemia   Hyperglycemia due to diabetes mellitus [facility])   Discharge Instructions:  Activity:  As tolerated  Discharge Instructions    Amb Referral to Nutrition and Diabetic E   Complete by: As directed    Call MD for:  difficulty breathing, headache or visual disturbances   Complete by: As directed    Call MD for:  extreme fatigue   Complete by: As directed    Call MD for:  persistant dizziness or light-headedness   Complete by: As directed    Diet - low sodium heart healthy  Complete by: As directed    Diet Carb Modified   Complete by: As directed    Discharge instructions   Complete by: As directed    Follow with Primary MD  Laruth BouchardLott, Tamika, MD in 1-2 weeks  Please keep a record of your CBGs-and take these readings to your  appointment with your PCP.  Please get a complete blood count and chemistry panel checked by your Primary MD at your next visit, and again as instructed by your Primary MD.  Get Medicines reviewed and adjusted: Please take all your medications with you for your next visit with your Primary MD  Laboratory/radiological data: Please request your Primary MD to go over all hospital tests and procedure/radiological results at the follow up, please ask your Primary MD to get all Hospital records sent to his/her office.  In some cases, they will be blood work, cultures and biopsy results pending at the time of your discharge. Please request that your primary care M.D. follows up on these results.  Also Note the following: If you experience worsening of your admission symptoms, develop shortness of breath, life threatening emergency, suicidal or homicidal thoughts you must seek medical attention immediately by calling 911 or calling your MD immediately  if symptoms less severe.  You must read complete instructions/literature along with all the possible adverse reactions/side effects for all the Medicines you take and that have been prescribed to you. Take any new Medicines after you have completely understood and accpet all the possible adverse reactions/side effects.   Do not drive when taking Pain medications or sleeping medications (Benzodaizepines)  Do not take more than prescribed Pain, Sleep and Anxiety Medications. It is not advisable to combine anxiety,sleep and pain medications without talking with your primary care practitioner  Special Instructions: If you have smoked or chewed Tobacco  in the last 2 yrs please stop smoking, stop any regular Alcohol  and or any Recreational drug use.  Wear Seat belts while driving.  Please note: You were cared for by a hospitalist during your hospital stay. Once you are discharged, your primary care physician will handle any further medical issues. Please  note that NO REFILLS for any discharge medications will be authorized once you are discharged, as it is imperative that you return to your primary care physician (or establish a relationship with a primary care physician if you do not have one) for your post hospital discharge needs so that they can reassess your need for medications and monitor your lab values.   Increase activity slowly   Complete by: As directed      Allergies as of 01/12/2020      Reactions   Other Itching   Ketchup      Medication List    STOP taking these medications   estradiol 1 MG tablet Commonly known as: Estrace   famotidine 20 MG tablet Commonly known as: PEPCID   hydrochlorothiazide 25 MG tablet Commonly known as: HYDRODIURIL   medroxyPROGESTERone 2.5 MG tablet Commonly known as: Provera   megestrol 40 MG tablet Commonly known as: MEGACE     TAKE these medications   albuterol 108 (90 Base) MCG/ACT inhaler Commonly known as: VENTOLIN HFA INHALE 1 TO 2 PUFFS BY MOUTH EVERY 4 HOURS AS NEEDED   atorvastatin 40 MG tablet Commonly known as: LIPITOR Take 1 tablet (40 mg total) by mouth at bedtime.   cetirizine 10 MG tablet Commonly known as: ZYRTEC Take 1 tablet (10 mg total) by mouth daily.  ferrous sulfate 325 (65 FE) MG tablet Take 1 tablet (325 mg total) by mouth daily.   insulin aspart 100 UNIT/ML FlexPen Commonly known as: NOVOLOG 0-20 Units, Subcutaneous, 3 times daily with meals CBG < 70: Implement Hypoglycemia measures, call MD CBG 70 - 120: 0 units CBG 121 - 150: 3 units CBG 151 - 200: 4 units CBG 201 - 250: 7 units CBG 251 - 300: 11 units CBG 301 - 350: 15 units CBG 351 - 400: 20 units CBG > 400: call MD   Insulin Pen Needle 32G X 4 MM Misc 1 each by Does not apply route as needed.   Lantus SoloStar 100 UNIT/ML Solostar Pen Generic drug: insulin glargine 46 units in am and 36 units in pm What changed:   how much to take  how to take this  when to take this  additional  instructions   metFORMIN 1000 MG tablet Commonly known as: GLUCOPHAGE Take 1,000 mg by mouth 2 (two) times daily with a meal.   metoprolol tartrate 50 MG tablet Commonly known as: LOPRESSOR Take 1 tablet (50 mg total) by mouth 2 (two) times daily. What changed: when to take this   venlafaxine XR 75 MG 24 hr capsule Commonly known as: EFFEXOR-XR Take 1 capsule (75 mg total) by mouth daily.       Allergies  Allergen Reactions  . Other Itching    Ketchup     Other Procedures/Studies: ECHOCARDIOGRAM COMPLETE  Result Date: 01/10/2020    ECHOCARDIOGRAM REPORT   Patient Name:   Deborah Mcclain Date of Exam: 01/10/2020 Medical Rec #:  [id number]      Height:       65.0 in Accession #:    [id number]     Weight:       293.0 lb Date of Birth:  09/29/1970      BSA:          2.327 m Patient Age:    49 years       BP:           110/73 mmHg Patient Gender: F              HR:           105 bpm. Exam Location:  Inpatient Procedure: 2D Echo and Intracardiac Opacification Agent Indications:    SOB (shortness of breath)  History:        Patient has no prior history of Echocardiogram examinations.                 Risk Factors:Diabetes and Hypertension. Asthma.  Sonographer:    Leta Jungling RDCS Referring Phys: [phone] EKTA V PATEL IMPRESSIONS  1. Left ventricular ejection fraction, by estimation, is 65 to 70%. Left ventricular ejection fraction by 2D MOD biplane is 68.0 %. The left ventricle has normal function. The left ventricle has no regional wall motion abnormalities. There is mild concentric left ventricular hypertrophy. Left ventricular diastolic parameters were normal.  2. Right ventricular systolic function is normal. The right ventricular size is normal.  3. The mitral valve is grossly normal. No evidence of mitral valve regurgitation. No evidence of mitral stenosis.  4. The aortic valve is tricuspid. Aortic valve regurgitation is not visualized. No aortic stenosis is present. Comparison(s): No  prior Echocardiogram. FINDINGS  Left Ventricle: Left ventricular ejection fraction, by estimation, is 65 to 70%. Left ventricular ejection fraction by 2D MOD biplane is 68.0 %. The left ventricle has normal function. The left ventricle  has no regional wall motion abnormalities. Definity contrast agent was given IV to delineate the left ventricular endocardial borders. The left ventricular internal cavity size was normal in size. There is mild concentric left ventricular hypertrophy. Left ventricular diastolic parameters were normal. Right Ventricle: The right ventricular size is normal. No increase in right ventricular wall thickness. Right ventricular systolic function is normal. Left Atrium: Left atrial size was normal in size. Right Atrium: Right atrial size was normal in size. Pericardium: Trivial pericardial effusion is present. Presence of pericardial fat pad. Mitral Valve: The mitral valve is grossly normal. No evidence of mitral valve regurgitation. No evidence of mitral valve stenosis. Tricuspid Valve: The tricuspid valve is grossly normal. Tricuspid valve regurgitation is trivial. No evidence of tricuspid stenosis. Aortic Valve: The aortic valve is tricuspid. Aortic valve regurgitation is not visualized. No aortic stenosis is present. Pulmonic Valve: The pulmonic valve was grossly normal. Pulmonic valve regurgitation is not visualized. No evidence of pulmonic stenosis. Aorta: The aortic root and ascending aorta are structurally normal, with no evidence of dilitation. Venous: The inferior vena cava was not well visualized. IAS/Shunts: The atrial septum is grossly normal.  LEFT VENTRICLE PLAX 2D                        Biplane EF (MOD) LVIDd:         4.40 cm         LV Biplane EF:   Left LVIDs:         2.40 cm                          ventricular LV PW:         1.20 cm                          ejection LV IVS:        1.50 cm                          fraction by LVOT diam:     1.80 cm                           2D MOD LV SV:         39                               biplane is LV SV Index:   17                               68.0 %. LVOT Area:     2.54 cm                                Diastology                                LV e' medial:    6.31 cm/s LV Volumes (MOD)               LV E/e' medial:  6.6 LV vol d, MOD    82.2 ml  LV e' lateral:   12.90 cm/s A2C:                           LV E/e' lateral: 3.2 LV vol d, MOD    125.0 ml A4C: LV vol s, MOD    29.5 ml A2C: LV vol s, MOD    35.7 ml A4C: LV SV MOD A2C:   52.7 ml LV SV MOD A4C:   125.0 ml LV SV MOD BP:    69.6 ml RIGHT VENTRICLE RV S prime:     13.70 cm/s TAPSE (M-mode): 2.1 cm LEFT ATRIUM           Index LA diam:      4.50 cm 1.93 cm/m LA Vol (A2C): 18.6 ml 7.99 ml/m LA Vol (A4C): 23.8 ml 10.23 ml/m  AORTIC VALVE LVOT Vmax:   120.00 cm/s LVOT Vmean:  78.600 cm/s LVOT VTI:    0.154 m  AORTA Ao Root diam: 3.10 cm Ao Asc diam:  3.00 cm MITRAL VALVE               TRICUSPID VALVE MV Area (PHT): 2.24 cm    TR Peak grad:   11.3 mmHg MV Decel Time: 338 msec    TR Vmax:        168.00 cm/s MV E velocity: 41.60 cm/s MV A velocity: 42.40 cm/s  SHUNTS MV E/A ratio:  0.98        Systemic VTI:  0.15 m                            Systemic Diam: 1.80 cm Lennie Odor MD Electronically signed by Lennie Odor MD Signature Date/Time: 01/10/2020/2:01:06 PM    Final      TODAY-DAY OF DISCHARGE:  Subjective:   Deborah Mcclain today has no headache,no chest abdominal pain,no new weakness tingling or numbness, feels much better wants to go home today.   Objective:   Blood pressure (!) 134/91, pulse 88, temperature 97.9 F (36.6 C), temperature source Oral, resp. rate 18, height 5\' 5"  (1.651 m), weight 132.9 kg, SpO2 100 %.  Intake/Output Summary (Last 24 hours) at 01/12/2020 0949 Last data filed at 01/12/2020 0600 Gross per 24 hour  Intake 320 ml  Output 2000 ml  Net -1680 ml   Filed Weights   01/09/20 1328  Weight: 132.9 kg    Exam: Awake Alert,  Oriented *3, No new F.N deficits, Normal affect Brice.AT,PERRAL Supple Neck,No JVD, No cervical lymphadenopathy appriciated.  Symmetrical Chest wall movement, Good air movement bilaterally, CTAB RRR,No Gallops,Rubs or new Murmurs, No Parasternal Heave +ve B.Sounds, Abd Soft, Non tender, No organomegaly appriciated, No rebound -guarding or rigidity. No Cyanosis, Clubbing or edema, No new Rash or bruise   PERTINENT RADIOLOGIC STUDIES: ECHOCARDIOGRAM COMPLETE  Result Date: 01/10/2020    ECHOCARDIOGRAM REPORT   Patient Name:   Deborah Mcclain Date of Exam: 01/10/2020 Medical Rec #:  [id number]      Height:       65.0 in Accession #:    [id number]     Weight:       293.0 lb Date of Birth:  03-17-1970      BSA:          2.327 m Patient Age:    49 years       BP:           110/73 mmHg Patient Gender:  F              HR:           105 bpm. Exam Location:  Inpatient Procedure: 2D Echo and Intracardiac Opacification Agent Indications:    SOB (shortness of breath)  History:        Patient has no prior history of Echocardiogram examinations.                 Risk Factors:Diabetes and Hypertension. Asthma.  Sonographer:    Leta Jungling RDCS Referring Phys: [phone] EKTA V PATEL IMPRESSIONS  1. Left ventricular ejection fraction, by estimation, is 65 to 70%. Left ventricular ejection fraction by 2D MOD biplane is 68.0 %. The left ventricle has normal function. The left ventricle has no regional wall motion abnormalities. There is mild concentric left ventricular hypertrophy. Left ventricular diastolic parameters were normal.  2. Right ventricular systolic function is normal. The right ventricular size is normal.  3. The mitral valve is grossly normal. No evidence of mitral valve regurgitation. No evidence of mitral stenosis.  4. The aortic valve is tricuspid. Aortic valve regurgitation is not visualized. No aortic stenosis is present. Comparison(s): No prior Echocardiogram. FINDINGS  Left Ventricle: Left ventricular  ejection fraction, by estimation, is 65 to 70%. Left ventricular ejection fraction by 2D MOD biplane is 68.0 %. The left ventricle has normal function. The left ventricle has no regional wall motion abnormalities. Definity contrast agent was given IV to delineate the left ventricular endocardial borders. The left ventricular internal cavity size was normal in size. There is mild concentric left ventricular hypertrophy. Left ventricular diastolic parameters were normal. Right Ventricle: The right ventricular size is normal. No increase in right ventricular wall thickness. Right ventricular systolic function is normal. Left Atrium: Left atrial size was normal in size. Right Atrium: Right atrial size was normal in size. Pericardium: Trivial pericardial effusion is present. Presence of pericardial fat pad. Mitral Valve: The mitral valve is grossly normal. No evidence of mitral valve regurgitation. No evidence of mitral valve stenosis. Tricuspid Valve: The tricuspid valve is grossly normal. Tricuspid valve regurgitation is trivial. No evidence of tricuspid stenosis. Aortic Valve: The aortic valve is tricuspid. Aortic valve regurgitation is not visualized. No aortic stenosis is present. Pulmonic Valve: The pulmonic valve was grossly normal. Pulmonic valve regurgitation is not visualized. No evidence of pulmonic stenosis. Aorta: The aortic root and ascending aorta are structurally normal, with no evidence of dilitation. Venous: The inferior vena cava was not well visualized. IAS/Shunts: The atrial septum is grossly normal.  LEFT VENTRICLE PLAX 2D                        Biplane EF (MOD) LVIDd:         4.40 cm         LV Biplane EF:   Left LVIDs:         2.40 cm                          ventricular LV PW:         1.20 cm                          ejection LV IVS:        1.50 cm  fraction by LVOT diam:     1.80 cm                          2D MOD LV SV:         39                               biplane is  LV SV Index:   17                               68.0 %. LVOT Area:     2.54 cm                                Diastology                                LV e' medial:    6.31 cm/s LV Volumes (MOD)               LV E/e' medial:  6.6 LV vol d, MOD    82.2 ml       LV e' lateral:   12.90 cm/s A2C:                           LV E/e' lateral: 3.2 LV vol d, MOD    125.0 ml A4C: LV vol s, MOD    29.5 ml A2C: LV vol s, MOD    35.7 ml A4C: LV SV MOD A2C:   52.7 ml LV SV MOD A4C:   125.0 ml LV SV MOD BP:    69.6 ml RIGHT VENTRICLE RV S prime:     13.70 cm/s TAPSE (M-mode): 2.1 cm LEFT ATRIUM           Index LA diam:      4.50 cm 1.93 cm/m LA Vol (A2C): 18.6 ml 7.99 ml/m LA Vol (A4C): 23.8 ml 10.23 ml/m  AORTIC VALVE LVOT Vmax:   120.00 cm/s LVOT Vmean:  78.600 cm/s LVOT VTI:    0.154 m  AORTA Ao Root diam: 3.10 cm Ao Asc diam:  3.00 cm MITRAL VALVE               TRICUSPID VALVE MV Area (PHT): 2.24 cm    TR Peak grad:   11.3 mmHg MV Decel Time: 338 msec    TR Vmax:        168.00 cm/s MV E velocity: 41.60 cm/s MV A velocity: 42.40 cm/s  SHUNTS MV E/A ratio:  0.98        Systemic VTI:  0.15 m                            Systemic Diam: 1.80 cm Lennie Odor MD Electronically signed by Lennie Odor MD Signature Date/Time: 01/10/2020/2:01:06 PM    Final      PERTINENT LAB RESULTS: CBC: Recent Labs    01/09/20 1323 01/09/20 1333  WBC 9.2  --   HGB 12.5 13.9  HCT 39.7 41.0  PLT 446*  --    CMET CMP     Component Value Date/Time   NA 134 (L) 01/12/2020 0129  K 3.4 (L) 01/12/2020 0129   CL 104 01/12/2020 0129   CO2 20 (L) 01/12/2020 0129   GLUCOSE 332 (H) 01/12/2020 0129   BUN 12 01/12/2020 0129   CREATININE 1.21 (H) 01/12/2020 0129   CALCIUM 9.4 01/12/2020 0129   PROT 7.7 11/29/2019 1617   ALBUMIN 3.8 11/29/2019 1617   AST 26 11/29/2019 1617   ALT 24 11/29/2019 1617   ALKPHOS 67 11/29/2019 1617   BILITOT 0.5 11/29/2019 1617   GFRNONAA 54 (L) 01/12/2020 0129   GFRAA >60 11/29/2019 1617     GFR Estimated Creatinine Clearance: 75.9 mL/min (A) (by C-G formula based on SCr of 1.21 mg/dL (H)). No results for input(s): LIPASE, AMYLASE in the last 72 hours. No results for input(s): CKTOTAL, CKMB, CKMBINDEX, TROPONINI in the last 72 hours. Invalid input(s): POCBNP Recent Labs    01/09/20 2059  DDIMER 0.41   Recent Labs    01/09/20 1830  HGBA1C 14.0*   Recent Labs    01/09/20 1830  CHOL 230*  HDL 28*  LDLCALC 129*  TRIG 363*  CHOLHDL 8.2   Recent Labs    01/09/20 1830  TSH 0.445   No results for input(s): VITAMINB12, FOLATE, FERRITIN, TIBC, IRON, RETICCTPCT in the last 72 hours. Coags: No results for input(s): INR in the last 72 hours.  Invalid input(s): PT Microbiology: Recent Results (from the past 240 hour(s))  Respiratory Panel by RT PCR (Flu A&B, Covid) - Nasopharyngeal Swab     Status: None   Collection Time: 01/09/20  1:55 PM   Specimen: Nasopharyngeal Swab  Result Value Ref Range Status   SARS Coronavirus 2 by RT PCR NEGATIVE NEGATIVE Final    Comment: (NOTE) SARS-CoV-2 target nucleic acids are NOT DETECTED.  The SARS-CoV-2 RNA is generally detectable in upper respiratoy specimens during the acute phase of infection. The lowest concentration of SARS-CoV-2 viral copies this assay can detect is 131 copies/mL. A negative result does not preclude SARS-Cov-2 infection and should not be used as the sole basis for treatment or other patient management decisions. A negative result may occur with  improper specimen collection/handling, submission of specimen other than nasopharyngeal swab, presence of viral mutation(s) within the areas targeted by this assay, and inadequate number of viral copies (<131 copies/mL). A negative result must be combined with clinical observations, patient history, and epidemiological information. The expected result is Negative.  Fact Sheet for Patients:  https://www.moore.com/  Fact Sheet for  Healthcare Providers:  https://www.young.biz/  This test is no t yet approved or cleared by the [place] FDA and  has been authorized for detection and/or diagnosis of SARS-CoV-2 by FDA under an Emergency Use Authorization (EUA). This EUA will remain  in effect (meaning this test can be used) for the duration of the COVID-19 declaration under Section 564(b)(1) of the Act, 21 U.S.C. section 360bbb-3(b)(1), unless the authorization is terminated or revoked sooner.     Influenza A by PCR NEGATIVE NEGATIVE Final   Influenza B by PCR NEGATIVE NEGATIVE Final    Comment: (NOTE) The Xpert Xpress SARS-CoV-2/FLU/RSV assay is intended as an aid in  the diagnosis of influenza from Nasopharyngeal swab specimens and  should not be used as a sole basis for treatment. Nasal washings and  aspirates are unacceptable for Xpert Xpress SARS-CoV-2/FLU/RSV  testing.  Fact Sheet for Patients: https://www.moore.com/  Fact Sheet for Healthcare Providers: https://www.young.biz/  This test is not yet approved or cleared by the [place] and  has been authorized for  detection and/or diagnosis of SARS-CoV-2 by  FDA under an Emergency Use Authorization (EUA). This EUA will remain  in effect (meaning this test can be used) for the duration of the  Covid-19 declaration under Section 564(b)(1) of the Act, 21  U.S.C. section 360bbb-3(b)(1), unless the authorization is  terminated or revoked. Performed at [facility], [address], [place], [place] [address]     FURTHER DISCHARGE INSTRUCTIONS:  Get Medicines reviewed and adjusted: Please take all your medications with you for your next visit with your Primary MD  Laboratory/radiological data: Please request your Primary MD to go over all hospital tests and procedure/radiological results at the follow up, please ask your Primary MD to get all Hospital records sent to his/her  office.  In some cases, they will be blood work, cultures and biopsy results pending at the time of your discharge. Please request that your primary care M.D. goes through all the records of your hospital data and follows up on these results.  Also Note the following: If you experience worsening of your admission symptoms, develop shortness of breath, life threatening emergency, suicidal or homicidal thoughts you must seek medical attention immediately by calling 911 or calling your MD immediately  if symptoms less severe.  You must read complete instructions/literature along with all the possible adverse reactions/side effects for all the Medicines you take and that have been prescribed to you. Take any new Medicines after you have completely understood and accpet all the possible adverse reactions/side effects.   Do not drive when taking Pain medications or sleeping medications (Benzodaizepines)  Do not take more than prescribed Pain, Sleep and Anxiety Medications. It is not advisable to combine anxiety,sleep and pain medications without talking with your primary care practitioner  Special Instructions: If you have smoked or chewed Tobacco  in the last 2 yrs please stop smoking, stop any regular Alcohol  and or any Recreational drug use.  Wear Seat belts while driving.  Please note: You were cared for by a hospitalist during your hospital stay. Once you are discharged, your primary care physician will handle any further medical issues. Please note that NO REFILLS for any discharge medications will be authorized once you are discharged, as it is imperative that you return to your primary care physician (or establish a relationship with a primary care physician if you do not have one) for your post hospital discharge needs so that they can reassess your need for medications and monitor your lab values.  Total Time spent coordinating discharge including counseling, education and face to face time  equals 35 minutes.  SignedJeoffrey Massed 01/12/2020 9:49 AM

## 2020-01-20 DIAGNOSIS — D509 Iron deficiency anemia, unspecified: Secondary | ICD-10-CM | POA: Insufficient documentation

## 2020-01-20 DIAGNOSIS — E785 Hyperlipidemia, unspecified: Secondary | ICD-10-CM | POA: Insufficient documentation

## 2020-01-31 ENCOUNTER — Ambulatory Visit: Payer: Medicaid Other | Admitting: Registered"

## 2020-02-19 ENCOUNTER — Telehealth: Payer: Self-pay | Admitting: *Deleted

## 2020-02-19 ENCOUNTER — Ambulatory Visit: Payer: Medicaid Other | Admitting: Dietician

## 2020-02-19 MED ORDER — MEGESTROL ACETATE 40 MG PO TABS
120.0000 mg | ORAL_TABLET | Freq: Every day | ORAL | 0 refills | Status: DC
Start: 2020-02-19 — End: 2020-03-27

## 2020-02-19 NOTE — Telephone Encounter (Signed)
Patient called requesting refill on megace. She is still having bleeding. She was seen at PCP for pre-work up for possible hysterectomy, but having issue with DM. Patient will need follow up to discuss AUB. Patient was seen at ED for AUB.  Clovis Pu, RN

## 2020-03-04 ENCOUNTER — Ambulatory Visit: Payer: Medicaid Other | Admitting: Obstetrics and Gynecology

## 2020-03-18 ENCOUNTER — Other Ambulatory Visit: Payer: Self-pay

## 2020-03-18 ENCOUNTER — Encounter: Payer: Self-pay | Admitting: Obstetrics

## 2020-03-18 ENCOUNTER — Ambulatory Visit (INDEPENDENT_AMBULATORY_CARE_PROVIDER_SITE_OTHER): Payer: Medicaid Other | Admitting: Obstetrics

## 2020-03-18 VITALS — BP 197/104 | HR 112 | Ht 65.0 in | Wt 288.2 lb

## 2020-03-18 DIAGNOSIS — E1169 Type 2 diabetes mellitus with other specified complication: Secondary | ICD-10-CM | POA: Diagnosis not present

## 2020-03-18 DIAGNOSIS — I10 Essential (primary) hypertension: Secondary | ICD-10-CM

## 2020-03-18 DIAGNOSIS — E669 Obesity, unspecified: Secondary | ICD-10-CM

## 2020-03-18 DIAGNOSIS — Z6841 Body Mass Index (BMI) 40.0 and over, adult: Secondary | ICD-10-CM

## 2020-03-18 DIAGNOSIS — N939 Abnormal uterine and vaginal bleeding, unspecified: Secondary | ICD-10-CM | POA: Diagnosis not present

## 2020-03-18 NOTE — Progress Notes (Signed)
Patient presents for follow for AUB. Patient states that she is still taking the Megace. The bleeding has stopped, but states that she is still having some cramping rated at a 7/10 on pain scale. Patient is requesting to have hgb checked. No other concerns.

## 2020-03-18 NOTE — Progress Notes (Signed)
Patient ID: Deborah Mcclain, female   DOB: [birth year], 50 y.o.   MRN: [id number]  Chief Complaint  Patient presents with  . Follow-up    HPI Deborah Mcclain is a 50 y.o. female.  Follow up from ER for AUB. History of heavy and prolonged periods.  History of anemia, requiring transfusion.  Wants a hysterectomy. HPI  Past Medical History:  Diagnosis Date  . Asthma   . Eczema   . Hypertension   . Type 2 diabetes mellitus (HCC) 12/2018    Past Surgical History:  Procedure Laterality Date  . CESAREAN SECTION  05/25/1997  . CESAREAN SECTION  03/30/2005   twins  . ENDOMETRIAL BIOPSY  12/03/2019    Family History  Problem Relation Age of Onset  . Diabetes Mother   . Hypertension Mother   . Diabetes Maternal Grandmother     Social History Social History   Tobacco Use  . Smoking status: Never Smoker  . Smokeless tobacco: Never Used  Vaping Use  . Vaping Use: Never used  Substance Use Topics  . Alcohol use: Yes  . Drug use: Never    Allergies  Allergen Reactions  . Other Itching    Ketchup    Current Outpatient Medications  Medication Sig Dispense Refill  . albuterol (VENTOLIN HFA) 108 (90 Base) MCG/ACT inhaler INHALE 1 TO 2 PUFFS BY MOUTH EVERY 4 HOURS AS NEEDED    . atorvastatin (LIPITOR) 40 MG tablet Take 1 tablet (40 mg total) by mouth at bedtime. 30 tablet 0  . cetirizine (ZYRTEC) 10 MG tablet Take 1 tablet (10 mg total) by mouth daily. 30 tablet 0  . ferrous sulfate 325 (65 FE) MG tablet Take 1 tablet (325 mg total) by mouth daily. 30 tablet 0  . insulin aspart (NOVOLOG) 100 UNIT/ML FlexPen 0-20 Units, Subcutaneous, 3 times daily with meals CBG < 70: Implement Hypoglycemia measures, call MD CBG 70 - 120: 0 units CBG 121 - 150: 3 units CBG 151 - 200: 4 units CBG 201 - 250: 7 units CBG 251 - 300: 11 units CBG 301 - 350: 15 units CBG 351 - 400: 20 units CBG > 400: call MD 15 mL 11  . Insulin Pen Needle 32G X 4 MM MISC 1 each by Does not apply route as needed. 200 each 0   . LANTUS SOLOSTAR 100 UNIT/ML Solostar Pen 46 units in am and 36 units in pm 15 mL 11  . megestrol (MEGACE) 40 MG tablet Take 3 tablets (120 mg total) by mouth daily. 90 tablet 0  . metFORMIN (GLUCOPHAGE) 1000 MG tablet Take 1,000 mg by mouth 2 (two) times daily with a meal.     . metoprolol tartrate (LOPRESSOR) 50 MG tablet Take 1 tablet (50 mg total) by mouth 2 (two) times daily. 60 tablet 0  . venlafaxine XR (EFFEXOR-XR) 75 MG 24 hr capsule Take 1 capsule (75 mg total) by mouth daily. 30 capsule 11   No current facility-administered medications for this visit.    Review of Systems Review of Systems Constitutional: negative for fatigue and weight loss Respiratory: negative for cough and wheezing Cardiovascular: negative for chest pain, fatigue and palpitations Gastrointestinal: negative for abdominal pain and change in bowel habits Genitourinary: positive for heavy and prolonged periods Integument/breast: negative for nipple discharge Musculoskeletal:negative for myalgias Neurological: negative for gait problems and tremors Behavioral/Psych: negative for abusive relationship, depression Endocrine: negative for temperature intolerance      Blood pressure (!) 197/104, pulse (!) 112, height 5'  5" (1.651 m), weight 288 lb 3.2 oz (130.7 kg), last menstrual period 01/31/2020.  Physical Exam Physical Exam General:   alert and no distress  Skin:   no rash or abnormalities  Lungs:   clear to auscultation bilaterally  Heart:   regular rate and rhythm, S1, S2 normal, no murmur, click, rub or gallop   50% of 15 min visit spent on counseling and coordination of care.   Data Reviewed Labs Ultrasound  Assessment     1. Abnormal uterine bleeding (AUB) - stable.  Taking Megace Rx: - CBC - Comprehensive metabolic panel  2. Class 3 severe obesity due to excess calories without serious comorbidity with body mass index (BMI) of 45.0 to 49.9 in adult [facility]) - program of caloric reduction,  exercise and behavioral modification recommended  3. Essential hypertension - uncontrolled.  Managed by PCP  4. Diabetes mellitus type 2 in obese (HCC) - uncontrolled.  Managed by PCP    Plan  Follow up with Dr. Rande Lawman for surgical consult for hysterectomy   Orders Placed This Encounter  Procedures  . CBC  . Comprehensive metabolic panel      Brock Bad, MD 03/18/2020 12:56 PM

## 2020-03-19 LAB — COMPREHENSIVE METABOLIC PANEL
ALT: 13 IU/L (ref 0–32)
AST: 10 IU/L (ref 0–40)
Albumin/Globulin Ratio: 1.3 (ref 1.2–2.2)
Albumin: 4.3 g/dL (ref 3.8–4.9)
Alkaline Phosphatase: 96 IU/L (ref 44–121)
BUN/Creatinine Ratio: 6 — ABNORMAL LOW (ref 9–23)
BUN: 6 mg/dL (ref 6–24)
Bilirubin Total: <0.2 mg/dL (ref 0.0–1.2)
CO2: 18 mmol/L — ABNORMAL LOW (ref 20–29)
Calcium: 9.7 mg/dL (ref 8.7–10.2)
Chloride: 102 mmol/L (ref 96–106)
Creatinine, Ser: 0.94 mg/dL (ref 0.57–1.00)
GFR calc Af Amer: 81 mL/min/{1.73_m2} (ref >59)
GFR calc non Af Amer: 70 mL/min/{1.73_m2} (ref >59)
Globulin, Total: 3.2 g/dL (ref 1.5–4.5)
Glucose: 364 mg/dL — ABNORMAL HIGH (ref 65–99)
Potassium: 4.2 mmol/L (ref 3.5–5.2)
Sodium: 136 mmol/L (ref 134–144)
Total Protein: 7.5 g/dL (ref 6.0–8.5)

## 2020-03-19 LAB — CBC
Hematocrit: 28.5 % — ABNORMAL LOW (ref 34.0–46.6)
Hemoglobin: 8.3 g/dL — ABNORMAL LOW (ref 11.1–15.9)
MCH: 24.6 pg — ABNORMAL LOW (ref 26.6–33.0)
MCHC: 29.1 g/dL — ABNORMAL LOW (ref 31.5–35.7)
MCV: 85 fL (ref 79–97)
Platelets: 594 10*3/uL — ABNORMAL HIGH (ref 150–450)
RBC: 3.37 x10E6/uL — ABNORMAL LOW (ref 3.77–5.28)
RDW: 14.5 % (ref 11.7–15.4)
WBC: 9.8 10*3/uL (ref 3.4–10.8)

## 2020-03-27 ENCOUNTER — Ambulatory Visit (INDEPENDENT_AMBULATORY_CARE_PROVIDER_SITE_OTHER): Payer: Medicaid Other | Admitting: Obstetrics and Gynecology

## 2020-03-27 ENCOUNTER — Encounter: Payer: Self-pay | Admitting: Obstetrics and Gynecology

## 2020-03-27 ENCOUNTER — Other Ambulatory Visit: Payer: Self-pay

## 2020-03-27 VITALS — BP 171/103 | HR 77 | Ht 64.0 in | Wt 286.0 lb

## 2020-03-27 DIAGNOSIS — N939 Abnormal uterine and vaginal bleeding, unspecified: Secondary | ICD-10-CM | POA: Diagnosis not present

## 2020-03-27 MED ORDER — MEGESTROL ACETATE 40 MG PO TABS: 40.0000 mg | ORAL_TABLET | Freq: Every day | ORAL | 5 refills | Status: DC

## 2020-03-27 NOTE — Progress Notes (Signed)
Patient presents for Consult visit today to discuss Hysterectomy per notes.  Pt saw Dr.Harper on 03/18/20 for AUB Labs were drawn as well.  Pt currently taking Megace 40mg  not having bleeding today with Rx management.   B/P elevated today and pt has a HA. Pt states it has been a while since she last saw PCP for B/P management last saw them in November 2021

## 2020-03-27 NOTE — Progress Notes (Signed)
Deborah Mcclain presents for eval of hysterecomy d/t AUB. Pt has has had a normal U/S in 9/21 and EMBX as well. Currently taking Megace qd and is having no bleeding  H/O HTN and DM, uncontrolled Last saw PCP 11/21  [id number]  H/O c section x 2  PE AF BP 171/103 Lungs clear Heart RRR Abd soft + Bs obese GU deferred  A/P DUB  Stable on Megace now. Needs pre op clearance d/t HTN and DM. Note sent to PCP. F/U in 4 months

## 2020-06-30 ENCOUNTER — Ambulatory Visit
Admission: RE | Admit: 2020-06-30 | Discharge: 2020-06-30 | Disposition: A | Discharge status: Home / Self Care | Payer: Medicaid Other | Source: Ambulatory Visit | Attending: Family Medicine | Admitting: Family Medicine

## 2020-06-30 ENCOUNTER — Other Ambulatory Visit: Payer: Self-pay

## 2020-06-30 VITALS — BP 154/95 | HR 98 | Temp 99.1°F | Resp 18

## 2020-06-30 DIAGNOSIS — E1165 Type 2 diabetes mellitus with hyperglycemia: Secondary | ICD-10-CM | POA: Diagnosis present

## 2020-06-30 DIAGNOSIS — Z794 Long term (current) use of insulin: Secondary | ICD-10-CM

## 2020-06-30 DIAGNOSIS — R109 Unspecified abdominal pain: Secondary | ICD-10-CM | POA: Diagnosis present

## 2020-06-30 DIAGNOSIS — N390 Urinary tract infection, site not specified: Secondary | ICD-10-CM

## 2020-06-30 LAB — POCT URINALYSIS DIP (MANUAL ENTRY)
Bilirubin, UA: NEGATIVE
Glucose, UA: >=1000 mg/dL — AB
Ketones, POC UA: NEGATIVE mg/dL
Leukocytes, UA: NEGATIVE
Nitrite, UA: NEGATIVE
Protein Ur, POC: 100 mg/dL — AB
Spec Grav, UA: 1.015 (ref 1.010–1.025)
Urobilinogen, UA: 1 E.U./dL
pH, UA: 5.5 (ref 5.0–8.0)

## 2020-06-30 LAB — POCT FASTING CBG KUC MANUAL ENTRY: POCT Glucose (KUC): 304 mg/dL — AB (ref 70–99)

## 2020-06-30 MED ORDER — CIPROFLOXACIN HCL 500 MG PO TABS: 500.0000 mg | ORAL_TABLET | Freq: Two times a day (BID) | ORAL | 0 refills | Status: DC

## 2020-06-30 MED ORDER — KETOROLAC TROMETHAMINE 10 MG PO TABS: 10.0000 mg | ORAL_TABLET | Freq: Four times a day (QID) | ORAL | 0 refills | Status: DC | PRN

## 2020-06-30 MED ORDER — INSULIN ASPART 100 UNIT/ML FLEXPEN: PEN_INJECTOR | SUBCUTANEOUS | 11 refills | Status: AC

## 2020-06-30 NOTE — ED Provider Notes (Signed)
[name]    [name]: [id number] Arrival date & time: 06/30/20  1118      History   Chief Complaint Chief Complaint  Patient presents with  . appt 12  . Flank Pain    HPI Deborah Mcclain is a 50 y.o. female.   HPI Patient with a history of type 2 diabetes with hyperglycemia presents today with right flank pain and right abdominal pain. Reports symptoms have been present over the last few days.  She is having difficulty urinating as she is only able to produce a scant amount of urine.  Initially the symptoms started in the lower pelvic region and has since progressed to the right side and flank area.  She had some nausea with vomiting over 2 days ago however that resolved.  She is hyperglycemic on arrival here today and reports she has not used any of her home insulin due to running out over a week ago.  Denies any fever.  No history of recurrent UTIs.  No history of renal stones.  Past Medical History:  Diagnosis Date  . Asthma   . Eczema   . Hypertension   . Type 2 diabetes mellitus (HCC) 12/2018    Patient Active Problem List   Diagnosis Date Noted  . Hyperglycemia due to diabetes mellitus (HCC) 01/09/2020  . Anemia 12/03/2019  . Abnormal uterine bleeding (AUB) 12/03/2019  . History of blood transfusion 12/03/2019  . Essential hypertension 04/16/2019  . Diabetes mellitus type 2 in obese (HCC) 04/16/2019  . Morbid obesity with BMI of 50.0-59.9, adult (HCC) 04/16/2019  . Menopausal vasomotor syndrome 04/16/2019    Past Surgical History:  Procedure Laterality Date  . CESAREAN SECTION  05/25/1997  . CESAREAN SECTION  03/30/2005   twins  . ENDOMETRIAL BIOPSY  12/03/2019    OB History    Gravida  2   Para  2   Term  1   Preterm  1   AB      Living  3     SAB      IAB      Ectopic      Multiple  1   Live Births  3            Home Medications    Prior to Admission medications   Medication Sig Start Date End Date Taking?  Authorizing Provider  ciprofloxacin (CIPRO) 500 MG tablet Take 1 tablet (500 mg total) by mouth 2 (two) times daily. 06/30/20  Yes Bing Neighbors, FNP  ketorolac (TORADOL) 10 MG tablet Take 1 tablet (10 mg total) by mouth every 6 (six) hours as needed. 06/30/20  Yes Bing Neighbors, FNP  albuterol (VENTOLIN HFA) 108 (90 Base) MCG/ACT inhaler INHALE 1 TO 2 PUFFS BY MOUTH EVERY 4 HOURS AS NEEDED 07/01/19   [provider]  atorvastatin (LIPITOR) 40 MG tablet Take 1 tablet (40 mg total) by mouth at bedtime. 01/12/20   Ghimire, Werner Lean, MD  cetirizine (ZYRTEC) 10 MG tablet Take 1 tablet (10 mg total) by mouth daily. 01/16/18   Dahlia Byes A, NP  ferrous sulfate 325 (65 FE) MG tablet Take 1 tablet (325 mg total) by mouth daily. 11/29/19   Jacalyn Lefevre, MD  insulin aspart (NOVOLOG) 100 UNIT/ML FlexPen 0-20 Units, Subcutaneous, 3 times daily with meals CBG < 70: Implement Hypoglycemia measures, call MD CBG 70 - 120: 0 units CBG 121 - 150: 3 units CBG 151 - 200: 4 units CBG 201 - 250:  7 units CBG 251 - 300: 11 units CBG 301 - 350: 15 units CBG 351 - 400: 20 units CBG > 400: call MD 06/30/20   Bing Neighbors, FNP  Insulin Pen Needle 32G X 4 MM MISC 1 each by Does not apply route as needed. 01/12/20   Ghimire, Werner Lean, MD  LANTUS SOLOSTAR 100 UNIT/ML Solostar Pen 46 units in am and 36 units in pm 01/12/20   Ghimire, Werner Lean, MD  megestrol (MEGACE) 40 MG tablet Take 1 tablet (40 mg total) by mouth daily. Can increase to two tablets twice a day in the event of heavy bleeding 03/27/20   Hermina Staggers, MD  metFORMIN (GLUCOPHAGE) 1000 MG tablet Take 1,000 mg by mouth 2 (two) times daily with a meal.     [provider]  metoprolol tartrate (LOPRESSOR) 50 MG tablet Take 1 tablet (50 mg total) by mouth 2 (two) times daily. 01/12/20   Ghimire, Werner Lean, MD  venlafaxine XR (EFFEXOR-XR) 75 MG 24 hr capsule Take 1 capsule (75 mg total) by mouth daily. 12/26/19   Leftwich-Kirby, Wilmer Floor, CNM     Family History Family History  Problem Relation Age of Onset  . Diabetes Mother   . Hypertension Mother   . Diabetes Maternal Grandmother     Social History Social History   Tobacco Use  . Smoking status: Never Smoker  . Smokeless tobacco: Never Used  Vaping Use  . Vaping Use: Never used  Substance Use Topics  . Alcohol use: Yes  . Drug use: Never     Allergies   Other   Review of Systems Review of Systems Pertinent negatives listed in HPI  Physical Exam Triage Vital Signs ED Triage Vitals  Enc Vitals Group     BP 06/30/20 1206 (!) 154/95     Pulse Rate 06/30/20 1206 98     Resp 06/30/20 1206 18     Temp 06/30/20 1206 99.1 F (37.3 C)     Temp Source 06/30/20 1206 Oral     SpO2 06/30/20 1206 100 %     Weight --      Height --      Head Circumference --      Peak Flow --      Pain Score 06/30/20 1207 9     Pain Loc --      Pain Edu? --      Excl. in GC? --    No data found.  Updated Vital Signs BP (!) 154/95 (BP Location: Left Arm)   Pulse 98   Temp 99.1 F (37.3 C) (Oral)   Resp 18   SpO2 100%   Visual Acuity Right Eye Distance:   Left Eye Distance:   Bilateral Distance:    Right Eye Near:   Left Eye Near:    Bilateral Near:     Physical Exam Constitutional:      Appearance: She is morbidly obese.  HENT:     Head: Normocephalic and atraumatic.  Cardiovascular:     Rate and Rhythm: Normal rate.  Abdominal:     General: Abdomen is protuberant.     Palpations: Abdomen is soft.     Tenderness: There is abdominal tenderness in the right upper quadrant and right lower quadrant. There is right CVA tenderness and guarding.     Hernia: No hernia is present.  Musculoskeletal:        General: Normal range of motion.     Cervical back: Normal  range of motion.  Skin:    General: Skin is warm and dry.     Capillary Refill: Capillary refill takes less than 2 seconds.  Neurological:     Mental Status: She is alert.      UC Treatments  / Results  Labs (all labs ordered are listed, but only abnormal results are displayed) Labs Reviewed  POCT URINALYSIS DIP (MANUAL ENTRY) - Abnormal; Notable for the following components:      Result Value   Clarity, UA cloudy (*)    Glucose, UA >=1,000 (*)    Blood, UA moderate (*)    Protein Ur, POC =100 (*)    All other components within normal limits  POCT FASTING CBG [facility] MANUAL ENTRY - Abnormal; Notable for the following components:   POCT Glucose ([facility]) 304 (*)    All other components within normal limits  URINE CULTURE    EKG   Radiology No results found.  Procedures Procedures (including critical care time)  Medications Ordered in UC Medications - No data to display  Initial Impression / Assessment and Plan / UC Course  I have reviewed the triage vital signs and the nursing notes.  Pertinent labs & imaging results that were available during my care of the patient were reviewed by me and considered in my medical decision making (see chart for details).    Patient presents today with urinary retention, hyperglycemia and right flank and right abdomen pain.  Concerned that patient likely has a UTI versus renal lithiasis.  Due to body habitus unable to do a KUB therefore will treat empirically for urinary tract infection as symptoms started as such while awaiting a urine culture.  Refilled patient's NovoLog as she is currently out of medication.  Treating with Cipro 500 mg 2 times daily for 7 days.  Also treating with Toradol 10 mg every 6 hours as needed for pain reviewed most recent CMP on file from January 2022 renal function is normal.  Strict ER precautions given if symptoms worsen or do not improve.  Patient advised to to keep follow-up with primary care doctor for further management of diabetes. Final Clinical Impressions(s) / UC Diagnoses   Final diagnoses:  Acute urinary tract infection  Right flank discomfort  Type 2 diabetes mellitus with hyperglycemia, with  long-term current use of insulin [facility])   Discharge Instructions   None    ED Prescriptions    Medication Sig Dispense Auth. Provider   insulin aspart (NOVOLOG) 100 UNIT/ML FlexPen 0-20 Units, Subcutaneous, 3 times daily with meals CBG < 70: Implement Hypoglycemia measures, call MD CBG 70 - 120: 0 units CBG 121 - 150: 3 units CBG 151 - 200: 4 units CBG 201 - 250: 7 units CBG 251 - 300: 11 units CBG 301 - 350: 15 units CBG 351 - 400: 20 units CBG > 400: call MD 15 mL Bing Neighbors, FNP   ciprofloxacin (CIPRO) 500 MG tablet Take 1 tablet (500 mg total) by mouth 2 (two) times daily. 14 tablet Bing Neighbors, FNP   ketorolac (TORADOL) 10 MG tablet Take 1 tablet (10 mg total) by mouth every 6 (six) hours as needed. 20 tablet Bing Neighbors, FNP     PDMP not reviewed this encounter.   Bing Neighbors, FNP 06/30/20 1441

## 2020-06-30 NOTE — ED Triage Notes (Signed)
Pt states ate something bad on last Thursday and has been gassy and center abdominal pain since. States since Saturday having rt flank pain radiating to RUQ pain and unable to complete full stream of urine. C/o some burning on urination. Denies vomiting or diarrhea.

## 2020-07-01 LAB — URINE CULTURE: Special Requests: NORMAL

## 2020-07-24 ENCOUNTER — Ambulatory Visit
Admission: RE | Admit: 2020-07-24 | Discharge: 2020-07-24 | Disposition: A | Discharge status: Home / Self Care | Payer: Medicaid Other | Source: Ambulatory Visit | Attending: Emergency Medicine | Admitting: Emergency Medicine

## 2020-07-24 ENCOUNTER — Other Ambulatory Visit: Payer: Self-pay

## 2020-07-24 VITALS — BP 138/97 | HR 99 | Temp 98.7°F | Resp 16

## 2020-07-24 DIAGNOSIS — L299 Pruritus, unspecified: Secondary | ICD-10-CM | POA: Diagnosis not present

## 2020-07-24 DIAGNOSIS — B372 Candidiasis of skin and nail: Secondary | ICD-10-CM

## 2020-07-24 DIAGNOSIS — R202 Paresthesia of skin: Secondary | ICD-10-CM | POA: Diagnosis not present

## 2020-07-24 MED ORDER — NYSTATIN 100000 UNIT/GM EX POWD: 1.0000 "application " | Freq: Three times a day (TID) | CUTANEOUS | 0 refills | Status: DC

## 2020-07-24 MED ORDER — TRIAMCINOLONE ACETONIDE 0.1 % EX CREA: 1.0000 "application " | TOPICAL_CREAM | Freq: Two times a day (BID) | CUTANEOUS | 0 refills | Status: AC

## 2020-07-24 MED ORDER — HYDROXYZINE HCL 25 MG PO TABS: 25.0000 mg | ORAL_TABLET | Freq: Four times a day (QID) | ORAL | 0 refills | Status: DC | PRN

## 2020-07-24 MED ORDER — GABAPENTIN 100 MG PO CAPS: 100.0000 mg | ORAL_CAPSULE | Freq: Three times a day (TID) | ORAL | 0 refills | Status: DC

## 2020-07-24 NOTE — ED Provider Notes (Signed)
[name]    [name]: [id number] Arrival date & time: 07/24/20  1041      History   Chief Complaint Chief Complaint  Patient presents with  . Hand Pain    Bilateral hand pain   . Foot Pain    Bilateral feet pain    HPI Deborah Mcclain is a 50 y.o. female history of DM type II, hypertension, eczema, asthma presenting today for evaluation of bilateral hand and foot pain along with generalized itching.  Reports 2+ week of sharp burning sensation pain in her hands and feet.  Reports history of similar previously, but wish of recently.  She does report previously being on gabapentin which did help, but is not taking this over the past few months.  She reports generalized itching all over, but more notable underneath bilateral breast.  In past been told this is eczema.  HPI  Past Medical History:  Diagnosis Date  . Asthma   . Eczema   . Hypertension   . Type 2 diabetes mellitus (HCC) 12/2018    Patient Active Problem List   Diagnosis Date Noted  . Hyperglycemia due to diabetes mellitus (HCC) 01/09/2020  . Anemia 12/03/2019  . Abnormal uterine bleeding (AUB) 12/03/2019  . History of blood transfusion 12/03/2019  . Essential hypertension 04/16/2019  . Diabetes mellitus type 2 in obese (HCC) 04/16/2019  . Morbid obesity with BMI of 50.0-59.9, adult (HCC) 04/16/2019  . Menopausal vasomotor syndrome 04/16/2019    Past Surgical History:  Procedure Laterality Date  . CESAREAN SECTION  05/25/1997  . CESAREAN SECTION  03/30/2005   twins  . ENDOMETRIAL BIOPSY  12/03/2019    OB History    Gravida  2   Para  2   Term  1   Preterm  1   AB      Living  3     SAB      IAB      Ectopic      Multiple  1   Live Births  3            Home Medications    Prior to Admission medications   Medication Sig Start Date End Date Taking? Authorizing Provider  gabapentin (NEURONTIN) 100 MG capsule Take 1 capsule (100 mg total) by mouth 3 (three) times daily.  07/24/20  Yes Eloy Fehl C, PA-C  hydrOXYzine (ATARAX/VISTARIL) 25 MG tablet Take 1 tablet (25 mg total) by mouth every 6 (six) hours as needed for itching. 07/24/20  Yes Emie Sommerfeld C, PA-C  nystatin (MYCOSTATIN/NYSTOP) powder Apply 1 application topically 3 (three) times daily. 07/24/20  Yes Paddy Walthall C, PA-C  triamcinolone cream (KENALOG) 0.1 % Apply 1 application topically 2 (two) times daily. 07/24/20  Yes Ricky Doan C, PA-C  albuterol (VENTOLIN HFA) 108 (90 Base) MCG/ACT inhaler INHALE 1 TO 2 PUFFS BY MOUTH EVERY 4 HOURS AS NEEDED 07/01/19   [provider]  atorvastatin (LIPITOR) 40 MG tablet Take 1 tablet (40 mg total) by mouth at bedtime. 01/12/20   Ghimire, Werner Lean, MD  cetirizine (ZYRTEC) 10 MG tablet Take 1 tablet (10 mg total) by mouth daily. 01/16/18   Dahlia Byes A, NP  ciprofloxacin (CIPRO) 500 MG tablet Take 1 tablet (500 mg total) by mouth 2 (two) times daily. 06/30/20   Bing Neighbors, FNP  ferrous sulfate 325 (65 FE) MG tablet Take 1 tablet (325 mg total) by mouth daily. 11/29/19   Jacalyn Lefevre, MD  insulin aspart (NOVOLOG)  100 UNIT/ML FlexPen 0-20 Units, Subcutaneous, 3 times daily with meals CBG < 70: Implement Hypoglycemia measures, call MD CBG 70 - 120: 0 units CBG 121 - 150: 3 units CBG 151 - 200: 4 units CBG 201 - 250: 7 units CBG 251 - 300: 11 units CBG 301 - 350: 15 units CBG 351 - 400: 20 units CBG > 400: call MD 06/30/20   Bing NeighborsHarris, Kimberly S, FNP  Insulin Pen Needle 32G X 4 MM MISC 1 each by Does not apply route as needed. 01/12/20   Ghimire, Werner LeanShanker M, MD  ketorolac (TORADOL) 10 MG tablet Take 1 tablet (10 mg total) by mouth every 6 (six) hours as needed. 06/30/20   Bing NeighborsHarris, Kimberly S, FNP  LANTUS SOLOSTAR 100 UNIT/ML Solostar Pen 46 units in am and 36 units in pm 01/12/20   Ghimire, Werner LeanShanker M, MD  megestrol (MEGACE) 40 MG tablet Take 1 tablet (40 mg total) by mouth daily. Can increase to two tablets twice a day in the event of heavy bleeding  03/27/20   Hermina StaggersErvin, Michael L, MD  metFORMIN (GLUCOPHAGE) 1000 MG tablet Take 1,000 mg by mouth 2 (two) times daily with a meal.     [provider]  metoprolol tartrate (LOPRESSOR) 50 MG tablet Take 1 tablet (50 mg total) by mouth 2 (two) times daily. 01/12/20   Ghimire, Werner LeanShanker M, MD  venlafaxine XR (EFFEXOR-XR) 75 MG 24 hr capsule Take 1 capsule (75 mg total) by mouth daily. 12/26/19   Leftwich-Kirby, Wilmer FloorLisa A, CNM    Family History Family History  Problem Relation Age of Onset  . Diabetes Mother   . Hypertension Mother   . Diabetes Maternal Grandmother     Social History Social History   Tobacco Use  . Smoking status: Never Smoker  . Smokeless tobacco: Never Used  Vaping Use  . Vaping Use: Never used  Substance Use Topics  . Alcohol use: Yes  . Drug use: Never     Allergies   Other   Review of Systems Review of Systems  Constitutional: Negative for fatigue and fever.  HENT: Negative for mouth sores.   Eyes: Negative for visual disturbance.  Respiratory: Negative for shortness of breath.   Cardiovascular: Negative for chest pain.  Gastrointestinal: Negative for abdominal pain, nausea and vomiting.  Genitourinary: Negative for genital sores.  Musculoskeletal: Positive for arthralgias. Negative for joint swelling.  Skin: Positive for color change and rash. Negative for wound.  Neurological: Negative for dizziness, weakness, light-headedness and headaches.     Physical Exam Triage Vital Signs ED Triage Vitals  Enc Vitals Group     BP 07/24/20 1102 (!) 138/97     Pulse Rate 07/24/20 1102 99     Resp 07/24/20 1102 16     Temp 07/24/20 1102 98.7 F (37.1 C)     Temp Source 07/24/20 1102 Oral     SpO2 07/24/20 1102 96 %     Weight --      Height --      Head Circumference --      Peak Flow --      Pain Score 07/24/20 1059 8     Pain Loc --      Pain Edu? --      Excl. in GC? --    No data found.  Updated Vital Signs BP (!) 138/97 (BP Location:  Right Arm)   Pulse 99   Temp 98.7 F (37.1 C) (Oral)   Resp 16  SpO2 96%   Visual Acuity Right Eye Distance:   Left Eye Distance:   Bilateral Distance:    Right Eye Near:   Left Eye Near:    Bilateral Near:     Physical Exam Vitals and nursing note reviewed.  Constitutional:      Appearance: She is well-developed.     Comments: No acute distress  HENT:     Head: Normocephalic and atraumatic.     Nose: Nose normal.  Eyes:     Conjunctiva/sclera: Conjunctivae normal.  Cardiovascular:     Rate and Rhythm: Normal rate.  Pulmonary:     Effort: Pulmonary effort is normal. No respiratory distress.  Abdominal:     General: There is no distension.  Musculoskeletal:        General: Normal range of motion.     Cervical back: Neck supple.     Comments: Bilateral hands and feet without discolored erythema or warmth, strength at shoulders and grip strength 5/5 ankle bilaterally, radial pulse 2+  Hip and knee strength 5/5 ankle bilaterally, dorsalis pedis 2+ bilaterally without any discoloration or rash noted, no ulcers or skin breakdown noted to feet or between toes  Skin:    General: Skin is warm and dry.     Comments: Bilateral breast folds with areas of hyperpigmentation  Neurological:     Mental Status: She is alert and oriented to person, place, and time.      UC Treatments / Results  Labs (all labs ordered are listed, but only abnormal results are displayed) Labs Reviewed - No data to display  EKG   Radiology No results found.  Procedures Procedures (including critical care time)  Medications Ordered in UC Medications - No data to display  Initial Impression / Assessment and Plan / UC Course  I have reviewed the triage vital signs and the nursing notes.  Pertinent labs & imaging results that were available during my care of the patient were reviewed by me and considered in my medical decision making (see chart for details).    Bilateral hand and feet  burning-suspect likely neuropathy, possible relation to diabetes, but patient reports having this before diabetes diagnosis.  Refilling gabapentin to restart 100 mg 3 times daily.  No weakness associated.   Treating for yeast underneath breast with nystatin, triamcinolone to help with itching/discoloration.  Continue daily Claritin, add in hydroxyzine for other generalized itching and encouraged hydration/moisturization of skin  Discussed strict return precautions. Patient verbalized understanding and is agreeable with plan.  Final Clinical Impressions(s) / UC Diagnoses   Final diagnoses:  Paresthesia of both hands  Yeast dermatitis  Pruritus     Discharge Instructions     Please restart gabapentin, follow-up with primary care for further refills of this or dosage adjustments Nystatin powder underneath breast both sides Triamcinolone twice daily to further help with itching Continue Claritin, add in hydroxyzine as needed for further itch relief-this may cause drowsiness Follow-up if any symptoms not improving or worsening    ED Prescriptions    Medication Sig Dispense Auth. Provider   gabapentin (NEURONTIN) 100 MG capsule Take 1 capsule (100 mg total) by mouth 3 (three) times daily. 90 capsule Dilynn Munroe C, PA-C   triamcinolone cream (KENALOG) 0.1 % Apply 1 application topically 2 (two) times daily. 30 g Davan Hark C, PA-C   nystatin (MYCOSTATIN/NYSTOP) powder Apply 1 application topically 3 (three) times daily. 30 g Vannesa Abair C, PA-C   hydrOXYzine (ATARAX/VISTARIL) 25 MG tablet Take 1  tablet (25 mg total) by mouth every 6 (six) hours as needed for itching. 12 tablet Vernelle Wisner, Dixon C, PA-C     PDMP not reviewed this encounter.   Lew Dawes, PA-C 07/24/20 1305

## 2020-07-24 NOTE — Discharge Instructions (Signed)
Please restart gabapentin, follow-up with primary care for further refills of this or dosage adjustments Nystatin powder underneath breast both sides Triamcinolone twice daily to further help with itching Continue Claritin, add in hydroxyzine as needed for further itch relief-this may cause drowsiness Follow-up if any symptoms not improving or worsening

## 2020-07-24 NOTE — ED Triage Notes (Signed)
Pt present bilateral hand and foot pain with burning sensation. Symptom started two weeks ago. Pt also C/O itching underneath her breast.

## 2020-09-03 ENCOUNTER — Other Ambulatory Visit: Payer: Self-pay | Admitting: Nurse Practitioner

## 2020-09-03 DIAGNOSIS — Z1231 Encounter for screening mammogram for malignant neoplasm of breast: Secondary | ICD-10-CM

## 2020-09-05 ENCOUNTER — Ambulatory Visit: Payer: Medicaid Other

## 2020-09-10 DIAGNOSIS — D72829 Elevated white blood cell count, unspecified: Secondary | ICD-10-CM | POA: Insufficient documentation

## 2020-09-10 DIAGNOSIS — E559 Vitamin D deficiency, unspecified: Secondary | ICD-10-CM | POA: Insufficient documentation

## 2020-09-18 ENCOUNTER — Ambulatory Visit: Payer: Medicaid Other

## 2020-09-29 ENCOUNTER — Ambulatory Visit: Payer: Medicaid Other | Admitting: Podiatry

## 2020-09-30 ENCOUNTER — Telehealth: Payer: Medicaid Other | Admitting: Family

## 2020-09-30 ENCOUNTER — Ambulatory Visit: Payer: Medicaid Other | Admitting: Podiatry

## 2020-10-05 ENCOUNTER — Ambulatory Visit: Payer: Self-pay

## 2020-10-06 ENCOUNTER — Ambulatory Visit: Payer: Medicaid Other

## 2020-10-09 ENCOUNTER — Encounter: Payer: Self-pay | Admitting: Gastroenterology

## 2020-10-10 ENCOUNTER — Emergency Department (HOSPITAL_COMMUNITY)
Admission: EM | Admit: 2020-10-10 | Discharge: 2020-10-10 | Disposition: A | Discharge status: Home / Self Care | Payer: Medicaid Other | Attending: Emergency Medicine | Admitting: Emergency Medicine

## 2020-10-10 ENCOUNTER — Emergency Department (HOSPITAL_COMMUNITY): Payer: Medicaid Other

## 2020-10-10 ENCOUNTER — Other Ambulatory Visit: Payer: Self-pay

## 2020-10-10 ENCOUNTER — Encounter (HOSPITAL_COMMUNITY): Payer: Self-pay | Admitting: Emergency Medicine

## 2020-10-10 DIAGNOSIS — E119 Type 2 diabetes mellitus without complications: Secondary | ICD-10-CM | POA: Diagnosis not present

## 2020-10-10 DIAGNOSIS — M25551 Pain in right hip: Secondary | ICD-10-CM | POA: Insufficient documentation

## 2020-10-10 DIAGNOSIS — Z20822 Contact with and (suspected) exposure to covid-19: Secondary | ICD-10-CM | POA: Diagnosis not present

## 2020-10-10 DIAGNOSIS — M549 Dorsalgia, unspecified: Secondary | ICD-10-CM | POA: Insufficient documentation

## 2020-10-10 DIAGNOSIS — J45909 Unspecified asthma, uncomplicated: Secondary | ICD-10-CM | POA: Insufficient documentation

## 2020-10-10 DIAGNOSIS — R519 Headache, unspecified: Secondary | ICD-10-CM

## 2020-10-10 DIAGNOSIS — Z794 Long term (current) use of insulin: Secondary | ICD-10-CM | POA: Diagnosis not present

## 2020-10-10 DIAGNOSIS — I10 Essential (primary) hypertension: Secondary | ICD-10-CM | POA: Diagnosis not present

## 2020-10-10 DIAGNOSIS — Z79899 Other long term (current) drug therapy: Secondary | ICD-10-CM | POA: Diagnosis not present

## 2020-10-10 DIAGNOSIS — Z7984 Long term (current) use of oral hypoglycemic drugs: Secondary | ICD-10-CM | POA: Diagnosis not present

## 2020-10-10 DIAGNOSIS — H9201 Otalgia, right ear: Secondary | ICD-10-CM | POA: Insufficient documentation

## 2020-10-10 LAB — COMPREHENSIVE METABOLIC PANEL
ALT: 18 U/L (ref 0–44)
AST: 25 U/L (ref 15–41)
Albumin: 4 g/dL (ref 3.5–5.0)
Alkaline Phosphatase: 83 U/L (ref 38–126)
Anion gap: 8 (ref 5–15)
BUN: 8 mg/dL (ref 6–20)
CO2: 25 mmol/L (ref 22–32)
Calcium: 9.9 mg/dL (ref 8.9–10.3)
Chloride: 106 mmol/L (ref 98–111)
Creatinine, Ser: 0.92 mg/dL (ref 0.44–1.00)
GFR, Estimated: >60 mL/min (ref >60)
Glucose, Bld: 120 mg/dL — ABNORMAL HIGH (ref 70–99)
Potassium: 4.3 mmol/L (ref 3.5–5.1)
Sodium: 139 mmol/L (ref 135–145)
Total Bilirubin: 0.8 mg/dL (ref 0.3–1.2)
Total Protein: 8.2 g/dL — ABNORMAL HIGH (ref 6.5–8.1)

## 2020-10-10 LAB — CBC WITH DIFFERENTIAL/PLATELET
Abs Immature Granulocytes: 0.03 10*3/uL (ref 0.00–0.07)
Basophils Absolute: 0 10*3/uL (ref 0.0–0.1)
Basophils Relative: 0 %
Eosinophils Absolute: 0.3 10*3/uL (ref 0.0–0.5)
Eosinophils Relative: 3 %
HCT: 35.7 % — ABNORMAL LOW (ref 36.0–46.0)
Hemoglobin: 11.3 g/dL — ABNORMAL LOW (ref 12.0–15.0)
Immature Granulocytes: 0 %
Lymphocytes Relative: 30 %
Lymphs Abs: 3.2 10*3/uL (ref 0.7–4.0)
MCH: 26.5 pg (ref 26.0–34.0)
MCHC: 31.7 g/dL (ref 30.0–36.0)
MCV: 83.8 fL (ref 80.0–100.0)
Monocytes Absolute: 0.5 10*3/uL (ref 0.1–1.0)
Monocytes Relative: 5 %
Neutro Abs: 6.6 10*3/uL (ref 1.7–7.7)
Neutrophils Relative %: 62 %
Platelets: 435 10*3/uL — ABNORMAL HIGH (ref 150–400)
RBC: 4.26 MIL/uL (ref 3.87–5.11)
RDW: 15.7 % — ABNORMAL HIGH (ref 11.5–15.5)
WBC: 10.7 10*3/uL — ABNORMAL HIGH (ref 4.0–10.5)
nRBC: 0 % (ref 0.0–0.2)

## 2020-10-10 LAB — RESP PANEL BY RT-PCR (FLU A&B, COVID) ARPGX2
Influenza A by PCR: NEGATIVE
Influenza B by PCR: NEGATIVE
SARS Coronavirus 2 by RT PCR: NEGATIVE

## 2020-10-10 LAB — URINALYSIS, ROUTINE W REFLEX MICROSCOPIC
Bilirubin Urine: NEGATIVE
Glucose, UA: NEGATIVE mg/dL
Ketones, ur: NEGATIVE mg/dL
Nitrite: NEGATIVE
Protein, ur: 30 mg/dL — AB
Specific Gravity, Urine: 1.006 (ref 1.005–1.030)
pH: 6 (ref 5.0–8.0)

## 2020-10-10 MED ORDER — HYDROCODONE-ACETAMINOPHEN 5-325 MG PO TABS
1.0000 | ORAL_TABLET | Freq: Once | ORAL | Status: AC
  Administered 2020-10-10: 1 via ORAL
  Filled 2020-10-10: qty 1

## 2020-10-10 MED ORDER — GABAPENTIN 300 MG PO CAPS
300.0000 mg | ORAL_CAPSULE | Freq: Once | ORAL | Status: AC
  Administered 2020-10-10: 300 mg via ORAL
  Filled 2020-10-10: qty 1

## 2020-10-10 MED ORDER — GABAPENTIN 300 MG PO CAPS: 300.0000 mg | ORAL_CAPSULE | Freq: Three times a day (TID) | ORAL | 0 refills | Status: DC

## 2020-10-10 NOTE — Discharge Instructions (Addendum)
You are seen today for headache and facial pain.  Your work-up is reassuring.  Your symptoms may be suggestive of trigeminal neuralgia.  Increase gabapentin to 300 mg 3 times daily.  Follow-up with neurology

## 2020-10-10 NOTE — ED Provider Notes (Signed)
Emergency Medicine Provider Triage Evaluation Note  Deborah Mcclain , a 50 y.o. female  was evaluated in triage.  Pt complains of body aches.  Patient states that about 2 weeks ago she was sitting in the hospital waiting on her mother and when getting up began experiencing pain to the right leg, hip, and lower back.  Her symptoms persisted and spread to the right side of the neck and right side of her head.  She woke up this morning had also experienced pain just inferior to the right orbit.  States she has taken Tylenol with minimal relief.  Denies any weakness, numbness, visual changes, chest pain, shortness of breath, nausea, vomiting, diarrhea, URI symptoms.  Physical Exam  BP (!) 187/119 (BP Location: Left Arm)   Pulse 78   Temp 99.4 F (37.4 C) (Oral)   Resp 16   Ht 5\' 4"  (1.626 m)   Wt 126.6 kg   SpO2 95%   BMI 47.89 kg/m  Gen:   Awake, no distress   Resp:  Normal effort  MSK:   Moves extremities without difficulty  Other:   Medical Decision Making  Medically screening exam initiated at 12:59 PM.  Appropriate orders placed.  Henreitta Spittler was informed that the remainder of the evaluation will be completed by another provider, this initial triage assessment does not replace that evaluation, and the importance of remaining in the ED until their evaluation is complete.   Prudencio Pair, PA-C 10/10/20 1300    10/12/20, MD 10/10/20 [phone]

## 2020-10-10 NOTE — ED Provider Notes (Signed)
[facility]-EMERGENCY DEPT Provider Note   [name]: [id number] Arrival date & time: 10/10/20  1228     History Chief Complaint  Patient presents with   Headache   Back Pain    Deborah Mcclain is a 50 y.o. female.  HPI     This 50 year old female with a history of hypertension, asthma, diabetes who presents with headache and facial pain.  Patient reports she has had sharp pains in the right side of her face along with headache.  She states that the pains come and go.  She has taken Tylenol with minimal relief.  Her pain is rated currently at 8 out of 10.  She also reports some right orbital pain and ear pain.  No upper respiratory symptoms, fever.  She saw her primary physician on Thursday and was told to come to the ER if she has ongoing symptoms.  She has not had any vision changes, weakness, numbness, speech difficulty, strokelike symptoms.  Patient does state that she has had some right-sided hip and leg pain onset 2 weeks ago after sitting for prolonged period of time.  She has taken some Tylenol with minimal relief.    Past Medical History:  Diagnosis Date   Asthma    Eczema    Hypertension    Type 2 diabetes mellitus (HCC) 12/2018    Patient Active Problem List   Diagnosis Date Noted   Hyperglycemia due to diabetes mellitus (HCC) 01/09/2020   Anemia 12/03/2019   Abnormal uterine bleeding (AUB) 12/03/2019   History of blood transfusion 12/03/2019   Essential hypertension 04/16/2019   Diabetes mellitus type 2 in obese (HCC) 04/16/2019   Morbid obesity with BMI of 50.0-59.9, adult (HCC) 04/16/2019   Menopausal vasomotor syndrome 04/16/2019    Past Surgical History:  Procedure Laterality Date   CESAREAN SECTION  05/25/1997   CESAREAN SECTION  03/30/2005   twins   ENDOMETRIAL BIOPSY  12/03/2019     OB History     Gravida  2   Para  2   Term  1   Preterm  1   AB      Living  3      SAB      IAB      Ectopic      Multiple  1    Live Births  3           Family History  Problem Relation Age of Onset   Diabetes Mother    Hypertension Mother    Diabetes Maternal Grandmother     Social History   Tobacco Use   Smoking status: Never   Smokeless tobacco: Never  Vaping Use   Vaping Use: Never used  Substance Use Topics   Alcohol use: Yes   Drug use: Never    Home Medications Prior to Admission medications   Medication Sig Start Date End Date Taking? Authorizing Provider  albuterol (VENTOLIN HFA) 108 (90 Base) MCG/ACT inhaler INHALE 1 TO 2 PUFFS BY MOUTH EVERY 4 HOURS AS NEEDED 07/01/19   [provider]  atorvastatin (LIPITOR) 40 MG tablet Take 1 tablet (40 mg total) by mouth at bedtime. 01/12/20   Ghimire, Werner Lean, MD  cetirizine (ZYRTEC) 10 MG tablet Take 1 tablet (10 mg total) by mouth daily. 01/16/18   Dahlia Byes A, NP  ciprofloxacin (CIPRO) 500 MG tablet Take 1 tablet (500 mg total) by mouth 2 (two) times daily. 06/30/20   Bing Neighbors, FNP  ferrous sulfate  325 (65 FE) MG tablet Take 1 tablet (325 mg total) by mouth daily. 11/29/19   Jacalyn LefevreHaviland, Julie, MD  gabapentin (NEURONTIN) 300 MG capsule Take 1 capsule (300 mg total) by mouth 3 (three) times daily. 10/10/20   Anelis Hrivnak, Mayer Maskerourtney F, MD  hydrOXYzine (ATARAX/VISTARIL) 25 MG tablet Take 1 tablet (25 mg total) by mouth every 6 (six) hours as needed for itching. 07/24/20   Wieters, Hallie C, PA-C  insulin aspart (NOVOLOG) 100 UNIT/ML FlexPen 0-20 Units, Subcutaneous, 3 times daily with meals CBG < 70: Implement Hypoglycemia measures, call MD CBG 70 - 120: 0 units CBG 121 - 150: 3 units CBG 151 - 200: 4 units CBG 201 - 250: 7 units CBG 251 - 300: 11 units CBG 301 - 350: 15 units CBG 351 - 400: 20 units CBG > 400: call MD 06/30/20   Bing NeighborsHarris, Kimberly S, FNP  Insulin Pen Needle 32G X 4 MM MISC 1 each by Does not apply route as needed. 01/12/20   Ghimire, Werner LeanShanker M, MD  ketorolac (TORADOL) 10 MG tablet Take 1 tablet (10 mg total) by mouth every 6 (six)  hours as needed. 06/30/20   Bing NeighborsHarris, Kimberly S, FNP  LANTUS SOLOSTAR 100 UNIT/ML Solostar Pen 46 units in am and 36 units in pm 01/12/20   Ghimire, Werner LeanShanker M, MD  megestrol (MEGACE) 40 MG tablet Take 1 tablet (40 mg total) by mouth daily. Can increase to two tablets twice a day in the event of heavy bleeding 03/27/20   Hermina StaggersErvin, Michael L, MD  metFORMIN (GLUCOPHAGE) 1000 MG tablet Take 1,000 mg by mouth 2 (two) times daily with a meal.     [provider]  metoprolol tartrate (LOPRESSOR) 50 MG tablet Take 1 tablet (50 mg total) by mouth 2 (two) times daily. 01/12/20   Ghimire, Werner LeanShanker M, MD  nystatin (MYCOSTATIN/NYSTOP) powder Apply 1 application topically 3 (three) times daily. 07/24/20   Wieters, Hallie C, PA-C  triamcinolone cream (KENALOG) 0.1 % Apply 1 application topically 2 (two) times daily. 07/24/20   Wieters, Hallie C, PA-C  venlafaxine XR (EFFEXOR-XR) 75 MG 24 hr capsule Take 1 capsule (75 mg total) by mouth daily. 12/26/19   Leftwich-Kirby, Wilmer FloorLisa A, CNM    Allergies    Other  Review of Systems   Review of Systems  Constitutional:  Negative for fever.  HENT:  Positive for ear pain. Negative for facial swelling.   Eyes:  Negative for photophobia, redness and visual disturbance.  Respiratory:  Negative for shortness of breath.   Cardiovascular:  Negative for chest pain.  Gastrointestinal:  Negative for abdominal pain.  Musculoskeletal:  Positive for back pain.  Neurological:  Positive for headaches.  All other systems reviewed and are negative.  Physical Exam Updated Vital Signs BP (!) 160/88 (BP Location: Right Arm)   Pulse 77   Temp 97.8 F (36.6 C) (Oral)   Resp 20   Ht 1.626 m (5\' 4" )   Wt 126.6 kg   SpO2 100%   BMI 47.89 kg/m   Physical Exam Vitals and nursing note reviewed.  Constitutional:      Appearance: She is well-developed. She is obese. She is not ill-appearing.  HENT:     Head: Normocephalic and atraumatic.     Comments: Tenderness to palpation over  the right side of the face at the TMJ and over the right cheek, no overlying skin changes Lateral TMs clear    Mouth/Throat:     Mouth: Mucous membranes are moist.  Eyes:     Extraocular Movements: Extraocular movements intact.     Pupils: Pupils are equal, round, and reactive to light.     Comments: Pupils equal round and reactive  Cardiovascular:     Rate and Rhythm: Normal rate and regular rhythm.     Heart sounds: Normal heart sounds.  Pulmonary:     Effort: Pulmonary effort is normal. No respiratory distress.     Breath sounds: No wheezing.  Abdominal:     General: Bowel sounds are normal.     Palpations: Abdomen is soft.  Musculoskeletal:     Cervical back: Normal range of motion and neck supple.  Skin:    General: Skin is warm and dry.  Neurological:     Mental Status: She is alert and oriented to person, place, and time.     Comments: Cranial nerves II through XII intact, 5/5 strength in all 4 extremities, no dysmetria to finger-nose-finger  Psychiatric:        Mood and Affect: Mood normal.    ED Results / Procedures / Treatments   Labs (all labs ordered are listed, but only abnormal results are displayed) Labs Reviewed  COMPREHENSIVE METABOLIC PANEL - Abnormal; Notable for the following components:      Result Value   Glucose, Bld 120 (*)    Total Protein 8.2 (*)    All other components within normal limits  CBC WITH DIFFERENTIAL/PLATELET - Abnormal; Notable for the following components:   WBC 10.7 (*)    Hemoglobin 11.3 (*)    HCT 35.7 (*)    RDW 15.7 (*)    Platelets 435 (*)    All other components within normal limits  URINALYSIS, ROUTINE W REFLEX MICROSCOPIC - Abnormal; Notable for the following components:   APPearance HAZY (*)    Hgb urine dipstick SMALL (*)    Protein, ur 30 (*)    Leukocytes,Ua LARGE (*)    Bacteria, UA RARE (*)    All other components within normal limits  RESP PANEL BY RT-PCR (FLU A&B, COVID) ARPGX2     EKG None  Radiology CT Head Wo Contrast  Result Date: 10/10/2020 CLINICAL DATA:  Patient with headache. EXAM: CT HEAD WITHOUT CONTRAST TECHNIQUE: Contiguous axial images were obtained from the base of the skull through the vertex without intravenous contrast. COMPARISON:  None. FINDINGS: Brain: Ventricles and sulci are appropriate for patient's age. No evidence for acute cortically based infarct, intracranial hemorrhage, mass lesion or mass-effect. Vascular: Unremarkable. Skull: Intact. Sinuses/Orbits: Paranasal sinuses well aerated. Mastoid air cells unremarkable. Other: None. IMPRESSION: No acute intracranial process. Electronically Signed   By: Annia Belt M.D.   On: 10/10/2020 16:53    Procedures Procedures   Medications Ordered in ED Medications  gabapentin (NEURONTIN) capsule 300 mg (300 mg Oral Given 10/10/20 1714)  HYDROcodone-acetaminophen (NORCO/VICODIN) 5-325 MG per tablet 1 tablet (1 tablet Oral Given 10/10/20 1713)    ED Course  I have reviewed the triage vital signs and the nursing notes.  Pertinent labs & imaging results that were available during my care of the patient were reviewed by me and considered in my medical decision making (see chart for details).    MDM Rules/Calculators/A&P                           Patient presents with mostly headache and facial pain.  Also reports some right leg pain but that has been more subacute.  She presented  to the emergency department for her right facial pain.  She is overall nontoxic and vital signs are reassuring.  Sounds highly suspicious for potential trigeminal neuralgia.  Referred TMJ pain, URI with otalgia, migraine also considerations.  Reports increased glucose at home.  Labs obtained from triage.  These were reviewed.  No significant metabolic derangements including glucose.  Urinalysis reassuring.  CT head shows no evidence of acute bleed.  Have low suspicion for subarachnoid hemorrhage.  Patient was given gabapentin  300 and Norco.  On recheck, she states she feels much better.  Given her history of diabetes, she is not a great candidate for steroids.  We will increase gabapentin to 300mg  3 times daily and have her follow-up with neurology.  After history, exam, and medical workup I feel the patient has been appropriately medically screened and is safe for discharge home. Pertinent diagnoses were discussed with the patient. Patient was given return precautions.  Final Clinical Impression(s) / ED Diagnoses Final diagnoses:  Bad headache    Rx / DC Orders ED Discharge Orders          Ordered    gabapentin (NEURONTIN) 300 MG capsule  3 times daily        10/10/20 1845             Swan Zayed, 10/12/20, MD 10/10/20 [phone]

## 2020-10-10 NOTE — ED Triage Notes (Signed)
Endorses headache, pains in her lower back, head, feet and legs all down her L side x1 week. When she went to PCP on Thursday she was told to come to ED if pains continued. States she is limping and her knee is really tight. Tried tylenol w/ minor relief.

## 2020-10-12 ENCOUNTER — Other Ambulatory Visit: Payer: Self-pay | Admitting: Student in an Organized Health Care Education/Training Program

## 2020-10-12 DIAGNOSIS — R519 Headache, unspecified: Secondary | ICD-10-CM

## 2020-10-13 ENCOUNTER — Encounter: Payer: Self-pay | Admitting: Neurology

## 2020-10-26 ENCOUNTER — Ambulatory Visit (INDEPENDENT_AMBULATORY_CARE_PROVIDER_SITE_OTHER): Payer: Medicaid Other

## 2020-10-26 ENCOUNTER — Ambulatory Visit
Admission: RE | Admit: 2020-10-26 | Discharge: 2020-10-26 | Disposition: A | Discharge status: Home / Self Care | Payer: Medicaid Other | Source: Ambulatory Visit | Attending: Urgent Care | Admitting: Urgent Care

## 2020-10-26 ENCOUNTER — Other Ambulatory Visit: Payer: Self-pay

## 2020-10-26 VITALS — BP 178/107 | HR 80 | Temp 98.3°F | Resp 16

## 2020-10-26 DIAGNOSIS — R0981 Nasal congestion: Secondary | ICD-10-CM

## 2020-10-26 DIAGNOSIS — I10 Essential (primary) hypertension: Secondary | ICD-10-CM

## 2020-10-26 DIAGNOSIS — R519 Headache, unspecified: Secondary | ICD-10-CM

## 2020-10-26 DIAGNOSIS — E119 Type 2 diabetes mellitus without complications: Secondary | ICD-10-CM

## 2020-10-26 DIAGNOSIS — G8929 Other chronic pain: Secondary | ICD-10-CM | POA: Diagnosis not present

## 2020-10-26 DIAGNOSIS — M5416 Radiculopathy, lumbar region: Secondary | ICD-10-CM

## 2020-10-26 DIAGNOSIS — M545 Low back pain, unspecified: Secondary | ICD-10-CM

## 2020-10-26 DIAGNOSIS — Z794 Long term (current) use of insulin: Secondary | ICD-10-CM

## 2020-10-26 LAB — POCT FASTING CBG KUC MANUAL ENTRY: POCT Glucose (KUC): 208 mg/dL — AB (ref 70–99)

## 2020-10-26 MED ORDER — TIZANIDINE HCL 4 MG PO TABS: 4.0000 mg | ORAL_TABLET | Freq: Three times a day (TID) | ORAL | 0 refills | Status: DC | PRN

## 2020-10-26 MED ORDER — AMOXICILLIN 875 MG PO TABS: 875.0000 mg | ORAL_TABLET | Freq: Two times a day (BID) | ORAL | 0 refills | Status: DC

## 2020-10-26 MED ORDER — GABAPENTIN 300 MG PO CAPS: 300.0000 mg | ORAL_CAPSULE | Freq: Three times a day (TID) | ORAL | 0 refills | Status: DC

## 2020-10-26 NOTE — Discharge Instructions (Addendum)
I suspect that your ongoing headaches are due to your uncontrolled high blood pressure and allergic rhinitis.  However, given the timeline of your persistent headaches, will prescribe an oral antibiotic to address a secondary sinus infection.  Please make sure that you continue taking your allergy medications.  Follow-up as soon as possible with your regular doctor for a recheck and continued management of your blood pressure.  If your headaches worsen, start to have vision changes, weakness on one side of the body that is new, difficulty with your speech then please report to the emergency room again.  For your back, please follow-up with the spine specialist as I suspect he will likely end up needing an MRI.  For now, continue using the gabapentin that I refilled for you.  You can use Tylenol for aches and pains as well, tizanidine as a muscle relaxant.

## 2020-10-26 NOTE — ED Triage Notes (Signed)
Low back pain radiating into leg, chronic. Was placed on gabapentin a month ago, has finished it and needs prescription re-filled. Also complaining of on-going facial pain.

## 2020-10-26 NOTE — ED Provider Notes (Signed)
[facility]   MRN: [id number] DOB: 06-Nov-1970  Subjective:   Deborah Mcclain is a 50 y.o. female presenting for 1 month history of persistent headaches, ongoing facial pain.  Patient was last seen for this on 10/10/2020, had negative head CT scan.  Has a history of allergic rhinitis, takes Zyrtec and Flonase daily.  She also has a history of hypertension and is working with her primary care provider on controlling this.  She recently had another blood pressure medication added to her regimen.  She is also diabetic, admits high blood sugar checks at home.  Denies chest pain, shortness of breath, diaphoresis, weakness.  She also has acute on chronic right-sided low back pain that radiates anteriorly into her right leg and has associated numbness and tingling of her feet bilaterally.  Denies fall, trauma.  Has not had to see a spine specialist for this.  No current facility-administered medications for this encounter.  Current Outpatient Medications:    albuterol (VENTOLIN HFA) 108 (90 Base) MCG/ACT inhaler, INHALE 1 TO 2 PUFFS BY MOUTH EVERY 4 HOURS AS NEEDED, Disp: , Rfl:    atorvastatin (LIPITOR) 40 MG tablet, Take 1 tablet (40 mg total) by mouth at bedtime., Disp: 30 tablet, Rfl: 0   cetirizine (ZYRTEC) 10 MG tablet, Take 1 tablet (10 mg total) by mouth daily., Disp: 30 tablet, Rfl: 0   ciprofloxacin (CIPRO) 500 MG tablet, Take 1 tablet (500 mg total) by mouth 2 (two) times daily., Disp: 14 tablet, Rfl: 0   ferrous sulfate 325 (65 FE) MG tablet, Take 1 tablet (325 mg total) by mouth daily., Disp: 30 tablet, Rfl: 0   gabapentin (NEURONTIN) 300 MG capsule, Take 1 capsule (300 mg total) by mouth 3 (three) times daily., Disp: 60 capsule, Rfl: 0   hydrOXYzine (ATARAX/VISTARIL) 25 MG tablet, Take 1 tablet (25 mg total) by mouth every 6 (six) hours as needed for itching., Disp: 12 tablet, Rfl: 0   insulin aspart (NOVOLOG) 100 UNIT/ML FlexPen, 0-20 Units, Subcutaneous, 3 times daily with  meals CBG < 70: Implement Hypoglycemia measures, call MD CBG 70 - 120: 0 units CBG 121 - 150: 3 units CBG 151 - 200: 4 units CBG 201 - 250: 7 units CBG 251 - 300: 11 units CBG 301 - 350: 15 units CBG 351 - 400: 20 units CBG > 400: call MD, Disp: 15 mL, Rfl: 11   Insulin Pen Needle 32G X 4 MM MISC, 1 each by Does not apply route as needed., Disp: 200 each, Rfl: 0   ketorolac (TORADOL) 10 MG tablet, Take 1 tablet (10 mg total) by mouth every 6 (six) hours as needed., Disp: 20 tablet, Rfl: 0   LANTUS SOLOSTAR 100 UNIT/ML Solostar Pen, 46 units in am and 36 units in pm, Disp: 15 mL, Rfl: 11   megestrol (MEGACE) 40 MG tablet, Take 1 tablet (40 mg total) by mouth daily. Can increase to two tablets twice a day in the event of heavy bleeding, Disp: 60 tablet, Rfl: 5   metFORMIN (GLUCOPHAGE) 1000 MG tablet, Take 1,000 mg by mouth 2 (two) times daily with a meal. , Disp: , Rfl:    metoprolol tartrate (LOPRESSOR) 50 MG tablet, Take 1 tablet (50 mg total) by mouth 2 (two) times daily., Disp: 60 tablet, Rfl: 0   nystatin (MYCOSTATIN/NYSTOP) powder, Apply 1 application topically 3 (three) times daily., Disp: 30 g, Rfl: 0   triamcinolone cream (KENALOG) 0.1 %, Apply 1 application topically 2 (two) times daily., Disp:  30 g, Rfl: 0   venlafaxine XR (EFFEXOR-XR) 75 MG 24 hr capsule, Take 1 capsule (75 mg total) by mouth daily., Disp: 30 capsule, Rfl: 11   Allergies  Allergen Reactions   Other Itching    Ketchup    Past Medical History:  Diagnosis Date   Asthma    Eczema    Hypertension    Type 2 diabetes mellitus (HCC) 12/2018     Past Surgical History:  Procedure Laterality Date   CESAREAN SECTION  05/25/1997   CESAREAN SECTION  03/30/2005   twins   ENDOMETRIAL BIOPSY  12/03/2019    Family History  Problem Relation Age of Onset   Diabetes Mother    Hypertension Mother    Diabetes Maternal Grandmother     Social History   Tobacco Use   Smoking status: Never   Smokeless tobacco: Never   Vaping Use   Vaping Use: Never used  Substance Use Topics   Alcohol use: Yes   Drug use: Never    ROS   Objective:   Vitals: BP (!) 178/107 (BP Location: Right Arm)   Pulse 80   Temp 98.3 F (36.8 C) (Oral)   Resp 16   SpO2 95%   BP 164/109 on recheck.   BP Readings from Last 3 Encounters:  10/26/20 (!) 178/107  10/10/20 (!) 160/88  07/24/20 (!) 138/97   Physical Exam Constitutional:      General: She is not in acute distress.    Appearance: Normal appearance. She is well-developed. She is not ill-appearing, toxic-appearing or diaphoretic.  HENT:     Head: Normocephalic and atraumatic.     Nose: Nose normal.     Mouth/Throat:     Mouth: Mucous membranes are moist.  Eyes:     Extraocular Movements: Extraocular movements intact.     Pupils: Pupils are equal, round, and reactive to light.  Cardiovascular:     Rate and Rhythm: Normal rate and regular rhythm.     Pulses: Normal pulses.     Heart sounds: Normal heart sounds. No murmur heard.   No friction rub. No gallop.  Pulmonary:     Effort: Pulmonary effort is normal. No respiratory distress.     Breath sounds: Normal breath sounds. No stridor. No wheezing, rhonchi or rales.  Musculoskeletal:     Lumbar back: Spasms and tenderness (over area outlined) present. No swelling, edema, deformity, signs of trauma, lacerations or bony tenderness. Normal range of motion. Negative right straight leg raise test and negative left straight leg raise test. No scoliosis.       Back:  Skin:    General: Skin is warm and dry.     Findings: No rash.  Neurological:     Mental Status: She is alert and oriented to person, place, and time.     Cranial Nerves: No cranial nerve deficit.     Motor: No weakness.     Coordination: Coordination normal.     Gait: Gait normal.     Deep Tendon Reflexes: Reflexes normal.     Comments: Negative Romberg and pronator drift.  Psychiatric:        Mood and Affect: Mood normal.         Behavior: Behavior normal.        Thought Content: Thought content normal.        Judgment: Judgment normal.    Results for orders placed or performed during the hospital encounter of 10/26/20 (from the past 24 hour(s))  POCT CBG (manual entry)     Status: Abnormal   Collection Time: 10/26/20 11:25 AM  Result Value Ref Range   POCT Glucose ([facility]) 208 (A) 70 - 99 mg/dL    Assessment and Plan :   PDMP not reviewed this encounter.  1. Persistent headaches   2. Type 2 diabetes mellitus treated with insulin (HCC)   3. Sinus congestion   4. Lumbar radiculopathy   5. Essential hypertension     Suspect that her headaches are likely related to uncontrolled hypertension and possible ongoing allergic rhinitis. Will try to address a secondary sinusitis with amoxicillin given timeline of her symptoms. However, patient needs to follow up with her PCP asap for recheck and continued management of her blood pressure. No signs of an acute encephalopathy. Regarding her back, will recommend seeing a spine specialist. Refilled her gabapentin. Offered a muscle relaxant. Will avoid prednisone course given her uncontrolled diabetes. Will call with radiology over-read results. Counseled patient on potential for adverse effects with medications prescribed/recommended today, ER and return-to-clinic precautions discussed, patient verbalized understanding.    Wallis Bamberg, PA-C 10/26/20 1140

## 2020-11-03 ENCOUNTER — Ambulatory Visit: Payer: Medicaid Other

## 2020-11-16 DIAGNOSIS — L408 Other psoriasis: Secondary | ICD-10-CM | POA: Insufficient documentation

## 2020-12-02 ENCOUNTER — Other Ambulatory Visit: Payer: Self-pay

## 2020-12-02 ENCOUNTER — Encounter: Payer: Self-pay | Admitting: Podiatry

## 2020-12-02 ENCOUNTER — Ambulatory Visit (INDEPENDENT_AMBULATORY_CARE_PROVIDER_SITE_OTHER): Payer: Medicaid Other | Admitting: Podiatry

## 2020-12-02 DIAGNOSIS — E669 Obesity, unspecified: Secondary | ICD-10-CM

## 2020-12-02 DIAGNOSIS — M79674 Pain in right toe(s): Secondary | ICD-10-CM

## 2020-12-02 DIAGNOSIS — M79675 Pain in left toe(s): Secondary | ICD-10-CM

## 2020-12-02 DIAGNOSIS — E1169 Type 2 diabetes mellitus with other specified complication: Secondary | ICD-10-CM

## 2020-12-02 DIAGNOSIS — B351 Tinea unguium: Secondary | ICD-10-CM | POA: Diagnosis not present

## 2020-12-02 NOTE — Progress Notes (Signed)
This patient presents to the office with chief complaint of long thick nails and diabetic feet.  This patient  says there  is  no pain and discomfort in their feet.  This patient says there are long thick painful nails.  These nails are painful walking and wearing shoes.  Patient has no history of infection or drainage from both feet.  Patient is unable to  self treat his own nails . This patient presents  to the office today for treatment of the  long nails and a foot evaluation due to history of  diabetes. She presents to the office with her mother.  General Appearance  Alert, conversant and in no acute stress.  Vascular  Dorsalis pedis and posterior tibial  pulses are palpable  bilaterally.  Capillary return is within normal limits  bilaterally. Temperature is within normal limits  bilaterally.  Neurologic  Senn-Weinstein monofilament wire test within normal limits / diminished. bilaterally. Muscle power within normal limits bilaterally.  Nails Thick disfigured discolored nails with subungual debris  from hallux to fifth toes bilaterally. No evidence of bacterial infection or drainage bilaterally.  Orthopedic  No limitations of motion of motion feet .  No crepitus or effusions noted.  No bony pathology or digital deformities noted.  HAV  B/L.  Skin  normotropic skin with no porokeratosis noted bilaterally.  No signs of infections or ulcers noted.     Onychomycosis  Diabetes with no foot complications  IE  Debride nails x 10.  A diabetic foot exam was performed and there is no evidence of any vascular or neurologic pathology.   RTC 4  months.   Helane Gunther DPM

## 2020-12-16 ENCOUNTER — Other Ambulatory Visit: Payer: Self-pay

## 2020-12-16 ENCOUNTER — Ambulatory Visit
Admission: RE | Admit: 2020-12-16 | Discharge: 2020-12-16 | Disposition: A | Discharge status: Home / Self Care | Payer: Medicaid Other | Source: Ambulatory Visit | Attending: Nurse Practitioner | Admitting: Nurse Practitioner

## 2020-12-16 DIAGNOSIS — Z1231 Encounter for screening mammogram for malignant neoplasm of breast: Secondary | ICD-10-CM

## 2020-12-23 NOTE — Progress Notes (Signed)
Erroneous encounter

## 2020-12-29 ENCOUNTER — Encounter: Payer: Medicaid Other | Admitting: Family

## 2020-12-29 DIAGNOSIS — Z7689 Persons encountering health services in other specified circumstances: Secondary | ICD-10-CM

## 2021-01-01 NOTE — Progress Notes (Deleted)
NEUROLOGY CONSULTATION NOTE  Deborah Mcclain MRN: [id number] DOB: [birth year]  Referring provider: Ross Marcus, MD (hospital referral) Primary care provider: Laruth Bouchard, MD  Reason for consult:  headache  Assessment/Plan:   ***   Subjective:  Deborah Mcclain is a 50 year old female with asthma, DM II, and HTN who presents for headache and facial pain.  History supplemented by ED note.  ***.  She went to the ED on 10/10/2020 for her symptoms.  CT head personally reviewed was unremarkable.  She was given gabapentin and Norco.  ***  Current NSAIDS/analgesics:  *** Current triptans:  *** Current ergotamine:  *** Current anti-emetic:  *** Current muscle relaxants:  *** Current Antihypertensive medications:  *** Current Antidepressant medications:  *** Current Anticonvulsant medications:  *** Current anti-CGRP:  *** Current Vitamins/Herbal/Supplements:  *** Current Antihistamines/Decongestants:  *** Other therapy:  *** Hormone/birth control:  *** Other medications:  ***  Past NSAIDS/analgesics:  *** Past abortive triptans:  *** Past abortive ergotamine:  *** Past muscle relaxants:  *** Past anti-emetic:  *** Past antihypertensive medications:  *** Past antidepressant medications:  *** Past anticonvulsant medications:  *** Past anti-CGRP:  *** Past vitamins/Herbal/Supplements:  *** Past antihistamines/decongestants:  *** Other past therapies:  ***  Caffeine:  *** Alcohol:  *** Smoker:  *** Diet:  *** Exercise:  *** Depression:  ***; Anxiety:  *** Other pain:  *** Sleep hygiene:  *** Family history of headache:  ***      PAST MEDICAL HISTORY: Past Medical History:  Diagnosis Date   Asthma    Eczema    Hypertension    Type 2 diabetes mellitus (HCC) 12/2018    PAST SURGICAL HISTORY: Past Surgical History:  Procedure Laterality Date   CESAREAN SECTION  05/25/1997   CESAREAN SECTION  03/30/2005   twins   ENDOMETRIAL BIOPSY  12/03/2019     MEDICATIONS: Current Outpatient Medications on File Prior to Visit  Medication Sig Dispense Refill   albuterol (VENTOLIN HFA) 108 (90 Base) MCG/ACT inhaler INHALE 1 TO 2 PUFFS BY MOUTH EVERY 4 HOURS AS NEEDED     amoxicillin (AMOXIL) 875 MG tablet Take 1 tablet (875 mg total) by mouth 2 (two) times daily. 14 tablet 0   atorvastatin (LIPITOR) 40 MG tablet Take 1 tablet (40 mg total) by mouth at bedtime. 30 tablet 0   cetirizine (ZYRTEC) 10 MG tablet Take 1 tablet (10 mg total) by mouth daily. 30 tablet 0   ferrous sulfate 325 (65 FE) MG tablet Take 1 tablet (325 mg total) by mouth daily. 30 tablet 0   gabapentin (NEURONTIN) 300 MG capsule Take 1 capsule (300 mg total) by mouth 3 (three) times daily. 60 capsule 0   hydrOXYzine (ATARAX/VISTARIL) 25 MG tablet Take 1 tablet (25 mg total) by mouth every 6 (six) hours as needed for itching. 12 tablet 0   insulin aspart (NOVOLOG) 100 UNIT/ML FlexPen 0-20 Units, Subcutaneous, 3 times daily with meals CBG < 70: Implement Hypoglycemia measures, call MD CBG 70 - 120: 0 units CBG 121 - 150: 3 units CBG 151 - 200: 4 units CBG 201 - 250: 7 units CBG 251 - 300: 11 units CBG 301 - 350: 15 units CBG 351 - 400: 20 units CBG > 400: call MD 15 mL 11   Insulin Pen Needle 32G X 4 MM MISC 1 each by Does not apply route as needed. 200 each 0   ketorolac (TORADOL) 10 MG tablet Take 1 tablet (10 mg total) by mouth  every 6 (six) hours as needed. 20 tablet 0   LANTUS SOLOSTAR 100 UNIT/ML Solostar Pen 46 units in am and 36 units in pm 15 mL 11   megestrol (MEGACE) 40 MG tablet Take 1 tablet (40 mg total) by mouth daily. Can increase to two tablets twice a day in the event of heavy bleeding 60 tablet 5   metFORMIN (GLUCOPHAGE) 1000 MG tablet Take 1,000 mg by mouth 2 (two) times daily with a meal.      metoprolol tartrate (LOPRESSOR) 50 MG tablet Take 1 tablet (50 mg total) by mouth 2 (two) times daily. 60 tablet 0   nystatin (MYCOSTATIN/NYSTOP) powder Apply 1 application  topically 3 (three) times daily. 30 g 0   tiZANidine (ZANAFLEX) 4 MG tablet Take 1 tablet (4 mg total) by mouth every 8 (eight) hours as needed for muscle spasms. 30 tablet 0   triamcinolone cream (KENALOG) 0.1 % Apply 1 application topically 2 (two) times daily. 30 g 0   venlafaxine XR (EFFEXOR-XR) 75 MG 24 hr capsule Take 1 capsule (75 mg total) by mouth daily. 30 capsule 11   No current facility-administered medications on file prior to visit.    ALLERGIES: Allergies  Allergen Reactions   Other Itching    Ketchup    FAMILY HISTORY: Family History  Problem Relation Age of Onset   Breast cancer Mother    Diabetes Mother    Hypertension Mother    Diabetes Maternal Grandmother     Objective:  *** General: No acute distress.  Patient appears well-groomed.   Head:  Normocephalic/atraumatic Eyes:  fundi examined but not visualized Neck: supple, no paraspinal tenderness, full range of motion Back: No paraspinal tenderness Heart: regular rate and rhythm Lungs: Clear to auscultation bilaterally. Vascular: No carotid bruits. Neurological Exam: Mental status: alert and oriented to person, place, and time, recent and remote memory intact, fund of knowledge intact, attention and concentration intact, speech fluent and not dysarthric, language intact. Cranial nerves: CN I: not tested CN II: pupils equal, round and reactive to light, visual fields intact CN III, IV, VI:  full range of motion, no nystagmus, no ptosis CN V: facial sensation intact. CN VII: upper and lower face symmetric CN VIII: hearing intact CN IX, X: gag intact, uvula midline CN XI: sternocleidomastoid and trapezius muscles intact CN XII: tongue midline Bulk & Tone: normal, no fasciculations. Motor:  muscle strength 5/5 throughout Sensation:  Pinprick, temperature and vibratory sensation intact. Deep Tendon Reflexes:  2+ throughout,  toes downgoing.   Finger to nose testing:  Without dysmetria.   Heel to shin:   Without dysmetria.   Gait:  Normal station and stride.  Romberg negative.    Thank you for allowing me to take part in the care of this patient.  Shon Millet, DO  CC: Laruth Bouchard, MD

## 2021-01-04 ENCOUNTER — Ambulatory Visit: Payer: Medicaid Other | Admitting: Neurology

## 2021-01-05 ENCOUNTER — Other Ambulatory Visit: Payer: Self-pay

## 2021-01-05 ENCOUNTER — Ambulatory Visit
Admission: RE | Admit: 2021-01-05 | Discharge: 2021-01-05 | Disposition: A | Discharge status: Home / Self Care | Payer: Medicaid Other | Source: Ambulatory Visit | Attending: Physician Assistant | Admitting: Physician Assistant

## 2021-01-05 VITALS — BP 137/92 | HR 76 | Temp 97.8°F | Resp 20

## 2021-01-05 DIAGNOSIS — J45901 Unspecified asthma with (acute) exacerbation: Secondary | ICD-10-CM | POA: Diagnosis not present

## 2021-01-05 LAB — POCT INFLUENZA A/B
Influenza A, POC: NEGATIVE
Influenza B, POC: NEGATIVE

## 2021-01-05 MED ORDER — PREDNISONE 20 MG PO TABS: 40.0000 mg | ORAL_TABLET | Freq: Every day | ORAL | 0 refills | Status: AC

## 2021-01-05 NOTE — ED Provider Notes (Signed)
[name]    [name]: [id number] Arrival date & time: 01/05/21  1127      History   Chief Complaint Chief Complaint  Patient presents with   Cough   Muscle Pain   Otalgia   Shortness of Breath    HPI Deborah Mcclain is a 50 y.o. female.   Patient here today for evaluation of nasal congestion and drainage, ear discomfort, chest congestion and cough that has seemed to worsen with time.  Her children were recently sick with viral illness and now she reports she has become sick.  She states that she has been trying to use her albuterol inhaler without significant relief.  She has not had fever or chills.  She denies any vomiting or diarrhea.  The history is provided by the patient.  Cough Associated symptoms: ear pain, shortness of breath and sore throat   Associated symptoms: no chills, no eye discharge, no fever and no wheezing   Muscle Pain Associated symptoms include shortness of breath. Pertinent negatives include no abdominal pain.  Otalgia Associated symptoms: congestion, cough and sore throat   Associated symptoms: no abdominal pain, no diarrhea, no fever and no vomiting   Shortness of Breath Associated symptoms: cough, ear pain and sore throat   Associated symptoms: no abdominal pain, no fever, no vomiting and no wheezing    Past Medical History:  Diagnosis Date   Asthma    Eczema    Hypertension    Type 2 diabetes mellitus (HCC) 12/2018    Patient Active Problem List   Diagnosis Date Noted   Pain due to onychomycosis of toenails of both feet 12/02/2020   Hyperglycemia due to diabetes mellitus (HCC) 01/09/2020   Anemia 12/03/2019   Abnormal uterine bleeding (AUB) 12/03/2019   History of blood transfusion 12/03/2019   Essential hypertension 04/16/2019   Diabetes mellitus type 2 in obese (HCC) 04/16/2019   Morbid obesity with BMI of 50.0-59.9, adult (HCC) 04/16/2019   Menopausal vasomotor syndrome 04/16/2019    Past Surgical History:  Procedure  Laterality Date   CESAREAN SECTION  05/25/1997   CESAREAN SECTION  03/30/2005   twins   ENDOMETRIAL BIOPSY  12/03/2019    OB History     Gravida  2   Para  2   Term  1   Preterm  1   AB      Living  3      SAB      IAB      Ectopic      Multiple  1   Live Births  3            Home Medications    Prior to Admission medications   Medication Sig Start Date End Date Taking? Authorizing Provider  predniSONE (DELTASONE) 20 MG tablet Take 2 tablets (40 mg total) by mouth daily with breakfast for 5 days. 01/05/21 01/10/21 Yes Tomi Bamberger, PA-C  albuterol (VENTOLIN HFA) 108 (90 Base) MCG/ACT inhaler INHALE 1 TO 2 PUFFS BY MOUTH EVERY 4 HOURS AS NEEDED 07/01/19   [provider]  amoxicillin (AMOXIL) 875 MG tablet Take 1 tablet (875 mg total) by mouth 2 (two) times daily. 10/26/20   Wallis Bamberg, PA-C  atorvastatin (LIPITOR) 40 MG tablet Take 1 tablet (40 mg total) by mouth at bedtime. 01/12/20   Ghimire, Werner Lean, MD  cetirizine (ZYRTEC) 10 MG tablet Take 1 tablet (10 mg total) by mouth daily. 01/16/18   Janace Aris, NP  ferrous  sulfate 325 (65 FE) MG tablet Take 1 tablet (325 mg total) by mouth daily. 11/29/19   Jacalyn Lefevre, MD  gabapentin (NEURONTIN) 300 MG capsule Take 1 capsule (300 mg total) by mouth 3 (three) times daily. 10/26/20   Wallis Bamberg, PA-C  hydrOXYzine (ATARAX/VISTARIL) 25 MG tablet Take 1 tablet (25 mg total) by mouth every 6 (six) hours as needed for itching. 07/24/20   Wieters, Hallie C, PA-C  insulin aspart (NOVOLOG) 100 UNIT/ML FlexPen 0-20 Units, Subcutaneous, 3 times daily with meals CBG < 70: Implement Hypoglycemia measures, call MD CBG 70 - 120: 0 units CBG 121 - 150: 3 units CBG 151 - 200: 4 units CBG 201 - 250: 7 units CBG 251 - 300: 11 units CBG 301 - 350: 15 units CBG 351 - 400: 20 units CBG > 400: call MD 06/30/20   Bing Neighbors, FNP  Insulin Pen Needle 32G X 4 MM MISC 1 each by Does not apply route as needed. 01/12/20    Ghimire, Werner Lean, MD  ketorolac (TORADOL) 10 MG tablet Take 1 tablet (10 mg total) by mouth every 6 (six) hours as needed. 06/30/20   Bing Neighbors, FNP  LANTUS SOLOSTAR 100 UNIT/ML Solostar Pen 46 units in am and 36 units in pm 01/12/20   Ghimire, Werner Lean, MD  megestrol (MEGACE) 40 MG tablet Take 1 tablet (40 mg total) by mouth daily. Can increase to two tablets twice a day in the event of heavy bleeding 03/27/20   Hermina Staggers, MD  metFORMIN (GLUCOPHAGE) 1000 MG tablet Take 1,000 mg by mouth 2 (two) times daily with a meal.     [provider]  metoprolol tartrate (LOPRESSOR) 50 MG tablet Take 1 tablet (50 mg total) by mouth 2 (two) times daily. 01/12/20   Ghimire, Werner Lean, MD  nystatin (MYCOSTATIN/NYSTOP) powder Apply 1 application topically 3 (three) times daily. 07/24/20   Wieters, Hallie C, PA-C  tiZANidine (ZANAFLEX) 4 MG tablet Take 1 tablet (4 mg total) by mouth every 8 (eight) hours as needed for muscle spasms. 10/26/20   Wallis Bamberg, PA-C  triamcinolone cream (KENALOG) 0.1 % Apply 1 application topically 2 (two) times daily. 07/24/20   Wieters, Hallie C, PA-C  venlafaxine XR (EFFEXOR-XR) 75 MG 24 hr capsule Take 1 capsule (75 mg total) by mouth daily. 12/26/19   Leftwich-Kirby, Wilmer Floor, CNM    Family History Family History  Problem Relation Age of Onset   Breast cancer Mother    Diabetes Mother    Hypertension Mother    Diabetes Maternal Grandmother     Social History Social History   Tobacco Use   Smoking status: Never   Smokeless tobacco: Never  Vaping Use   Vaping Use: Never used  Substance Use Topics   Alcohol use: Yes   Drug use: Never     Allergies   Other   Review of Systems Review of Systems  Constitutional:  Negative for chills and fever.  HENT:  Positive for congestion, ear pain, sinus pressure and sore throat.   Eyes:  Negative for discharge and redness.  Respiratory:  Positive for cough and shortness of breath. Negative for  wheezing.   Gastrointestinal:  Negative for abdominal pain, diarrhea, nausea and vomiting.    Physical Exam Triage Vital Signs ED Triage Vitals  Enc Vitals Group     BP 01/05/21 1230 (!) 137/92     Pulse Rate 01/05/21 1230 76     Resp 01/05/21 1230 20  Temp 01/05/21 1230 97.8 F (36.6 C)     Temp src --      SpO2 01/05/21 1230 95 %     Weight --      Height --      Head Circumference --      Peak Flow --      Pain Score 01/05/21 1227 7     Pain Loc --      Pain Edu? --      Excl. in GC? --    No data found.  Updated Vital Signs BP (!) 137/92   Pulse 76   Temp 97.8 F (36.6 C)   Resp 20   SpO2 95%      Physical Exam Vitals and nursing note reviewed.  Constitutional:      General: She is not in acute distress.    Appearance: Normal appearance. She is not ill-appearing.  HENT:     Head: Normocephalic and atraumatic.     Right Ear: Tympanic membrane normal.     Left Ear: There is impacted cerumen.     Nose: Congestion present.     Mouth/Throat:     Mouth: Mucous membranes are moist.     Pharynx: No oropharyngeal exudate or posterior oropharyngeal erythema.  Eyes:     Conjunctiva/sclera: Conjunctivae normal.  Cardiovascular:     Rate and Rhythm: Normal rate and regular rhythm.     Heart sounds: Normal heart sounds. No murmur heard. Pulmonary:     Effort: Pulmonary effort is normal. No respiratory distress.     Breath sounds: Normal breath sounds. No wheezing, rhonchi or rales.  Skin:    General: Skin is warm and dry.  Neurological:     Mental Status: She is alert.  Psychiatric:        Mood and Affect: Mood normal.        Thought Content: Thought content normal.     UC Treatments / Results  Labs (all labs ordered are listed, but only abnormal results are displayed) Labs Reviewed  POCT INFLUENZA A/B    EKG   Radiology No results found.  Procedures Procedures (including critical care time)  Medications Ordered in UC Medications - No data  to display  Initial Impression / Assessment and Plan / UC Course  I have reviewed the triage vital signs and the nursing notes.  Pertinent labs & imaging results that were available during my care of the patient were reviewed by me and considered in my medical decision making (see chart for details).  Prednisone prescribed for suspected asthma exacerbation.  Flu test negative in office.  Patient reports that she screen for COVID yesterday.  Declines any other screening today.  Urged follow-up if symptoms fail to improve or worsen anyway.  Final Clinical Impressions(s) / UC Diagnoses   Final diagnoses:  Asthma with acute exacerbation, unspecified asthma severity, unspecified whether persistent   Discharge Instructions   None    ED Prescriptions     Medication Sig Dispense Auth. Provider   predniSONE (DELTASONE) 20 MG tablet Take 2 tablets (40 mg total) by mouth daily with breakfast for 5 days. 10 tablet Tomi Bamberger, PA-C      PDMP not reviewed this encounter.   Tomi Bamberger, PA-C 01/05/21 1326

## 2021-01-05 NOTE — ED Triage Notes (Signed)
Pt reports approx. 1 week ago her childern were sick and Pt now has same Sx's. Pt reports HA.sore throat,cough. Pt has increased the usage of her rescue inhaler for asthma .

## 2021-01-07 ENCOUNTER — Encounter: Payer: Self-pay | Admitting: *Deleted

## 2021-03-03 ENCOUNTER — Ambulatory Visit: Payer: Medicaid Other | Admitting: Podiatry

## 2021-05-02 ENCOUNTER — Other Ambulatory Visit: Payer: Self-pay

## 2021-05-02 ENCOUNTER — Ambulatory Visit
Admission: RE | Admit: 2021-05-02 | Discharge: 2021-05-02 | Disposition: A | Discharge status: Home / Self Care | Payer: Medicaid Other | Source: Ambulatory Visit | Attending: Physician Assistant | Admitting: Physician Assistant

## 2021-05-02 VITALS — BP 154/104 | HR 75 | Temp 97.9°F | Ht 64.0 in | Wt 280.0 lb

## 2021-05-02 DIAGNOSIS — E1169 Type 2 diabetes mellitus with other specified complication: Secondary | ICD-10-CM

## 2021-05-02 DIAGNOSIS — E669 Obesity, unspecified: Secondary | ICD-10-CM | POA: Diagnosis not present

## 2021-05-02 DIAGNOSIS — M5441 Lumbago with sciatica, right side: Secondary | ICD-10-CM | POA: Diagnosis not present

## 2021-05-02 MED ORDER — PREDNISONE 20 MG PO TABS: 20.0000 mg | ORAL_TABLET | Freq: Every day | ORAL | 0 refills | Status: AC

## 2021-05-02 MED ORDER — CYCLOBENZAPRINE HCL 10 MG PO TABS: 10.0000 mg | ORAL_TABLET | Freq: Two times a day (BID) | ORAL | 0 refills | Status: DC | PRN

## 2021-05-02 NOTE — ED Provider Notes (Signed)
[facility]    [name]: [id number] Arrival date & time: 05/02/21  1201      History   Chief Complaint Chief Complaint  Patient presents with   Back Pain    HPI Deborah Mcclain is a 51 y.o. female.   Patient here today for evaluation of right-sided low back pain that radiates down her right leg that is been ongoing for the last 1 to 2 weeks.  She denies any numbness or tingling.  She reports pain wakes her at night.  She has tried Tylenol and warm compresses without significant relief.  The history is provided by the patient.  Back Pain Associated symptoms: no abdominal pain, no fever and no numbness    Past Medical History:  Diagnosis Date   Asthma    Eczema    Hypertension    Type 2 diabetes mellitus ([place]) 12/2018    Patient Active Problem List   Diagnosis Date Noted   Pain due to onychomycosis of toenails of both feet 12/02/2020   Hyperglycemia due to diabetes mellitus ([place]) 01/09/2020   Anemia 12/03/2019   Abnormal uterine bleeding (AUB) 12/03/2019   History of blood transfusion 12/03/2019   Essential hypertension 04/16/2019   Diabetes mellitus type 2 in obese ([place]) 04/16/2019   Morbid obesity with BMI of 50.0-59.9, adult ([place]) 04/16/2019   Menopausal vasomotor syndrome 04/16/2019    Past Surgical History:  Procedure Laterality Date   CESAREAN SECTION  05/25/1997   CESAREAN SECTION  03/30/2005   twins   ENDOMETRIAL BIOPSY  12/03/2019    OB History     Gravida  2   Para  2   Term  1   Preterm  1   AB      Living  3      SAB      IAB      Ectopic      Multiple  1   Live Births  3            Home Medications    Prior to Admission medications   Medication Sig Start Date End Date Taking? Authorizing Provider  albuterol (VENTOLIN HFA) 108 (90 Base) MCG/ACT inhaler INHALE 1 TO 2 PUFFS BY MOUTH EVERY 4 HOURS AS NEEDED 07/01/19  Yes [provider]  insulin aspart (NOVOLOG) 100 UNIT/ML FlexPen 0-20 Units,  Subcutaneous, 3 times daily with meals CBG < 70: Implement Hypoglycemia measures, call MD CBG 70 - 120: 0 units CBG 121 - 150: 3 units CBG 151 - 200: 4 units CBG 201 - 250: 7 units CBG 251 - 300: 11 units CBG 301 - 350: 15 units CBG 351 - 400: 20 units CBG > 400: call MD 06/30/20  Yes Scot Jun, FNP  Insulin Pen Needle 32G X 4 MM MISC 1 each by Does not apply route as needed. 01/12/20  Yes Ghimire, Henreitta Leber, MD  LANTUS SOLOSTAR 100 UNIT/ML Solostar Pen 46 units in am and 36 units in pm 01/12/20  Yes Ghimire, Henreitta Leber, MD  metFORMIN (GLUCOPHAGE) 1000 MG tablet Take 1,000 mg by mouth 2 (two) times daily with a meal.    Yes [provider]  metoprolol tartrate (LOPRESSOR) 50 MG tablet Take 1 tablet (50 mg total) by mouth 2 (two) times daily. 01/12/20  Yes Ghimire, Henreitta Leber, MD  amoxicillin (AMOXIL) 875 MG tablet Take 1 tablet (875 mg total) by mouth 2 (two) times daily. 10/26/20   Jaynee Eagles, PA-C  atorvastatin (LIPITOR) 40 MG tablet  Take 1 tablet (40 mg total) by mouth at bedtime. 01/12/20   Ghimire, Henreitta Leber, MD  cetirizine (ZYRTEC) 10 MG tablet Take 1 tablet (10 mg total) by mouth daily. 01/16/18   Loura Halt A, NP  ferrous sulfate 325 (65 FE) MG tablet Take 1 tablet (325 mg total) by mouth daily. 11/29/19   Isla Pence, MD  gabapentin (NEURONTIN) 300 MG capsule Take 1 capsule (300 mg total) by mouth 3 (three) times daily. 10/26/20   Jaynee Eagles, PA-C  hydrOXYzine (ATARAX/VISTARIL) 25 MG tablet Take 1 tablet (25 mg total) by mouth every 6 (six) hours as needed for itching. 07/24/20   Wieters, Hallie C, PA-C  ketorolac (TORADOL) 10 MG tablet Take 1 tablet (10 mg total) by mouth every 6 (six) hours as needed. 06/30/20   Scot Jun, FNP  megestrol (MEGACE) 40 MG tablet Take 1 tablet (40 mg total) by mouth daily. Can increase to two tablets twice a day in the event of heavy bleeding 03/27/20   Chancy Milroy, MD  nystatin (MYCOSTATIN/NYSTOP) powder Apply 1 application topically  3 (three) times daily. 07/24/20   Wieters, Hallie C, PA-C  tiZANidine (ZANAFLEX) 4 MG tablet Take 1 tablet (4 mg total) by mouth every 8 (eight) hours as needed for muscle spasms. 10/26/20   Jaynee Eagles, PA-C  triamcinolone cream (KENALOG) 0.1 % Apply 1 application topically 2 (two) times daily. 07/24/20   Wieters, Hallie C, PA-C  venlafaxine XR (EFFEXOR-XR) 75 MG 24 hr capsule Take 1 capsule (75 mg total) by mouth daily. 12/26/19   Leftwich-Kirby, Kathie Dike, CNM    Family History Family History  Problem Relation Age of Onset   Breast cancer Mother    Diabetes Mother    Hypertension Mother    Diabetes Maternal Grandmother     Social History Social History   Tobacco Use   Smoking status: Never   Smokeless tobacco: Never  Vaping Use   Vaping Use: Never used  Substance Use Topics   Alcohol use: Yes   Drug use: Never     Allergies   Other   Review of Systems Review of Systems  Constitutional:  Negative for chills and fever.  Eyes:  Negative for discharge and redness.  Gastrointestinal:  Negative for abdominal pain, nausea and vomiting.  Musculoskeletal:  Positive for back pain and myalgias.  Neurological:  Negative for numbness.    Physical Exam Triage Vital Signs ED Triage Vitals  Enc Vitals Group     BP 05/02/21 1302 (!) 154/104     Pulse Rate 05/02/21 1302 75     Resp --      Temp 05/02/21 1302 97.9 F (36.6 C)     Temp Source 05/02/21 1302 Oral     SpO2 05/02/21 1302 97 %     Weight 05/02/21 1304 280 lb (127 kg)     Height 05/02/21 1304 5\' 4"  (1.626 m)     Head Circumference --      Peak Flow --      Pain Score 05/02/21 1304 8     Pain Loc --      Pain Edu? --      Excl. in [place]? --    No data found.  Updated Vital Signs BP (!) 154/104 (BP Location: Left Arm)    Pulse 75    Temp 97.9 F (36.6 C) (Oral)    Ht 5\' 4"  (1.626 m)    Wt 280 lb (127 kg)    SpO2  97%    BMI 48.06 kg/m      Physical Exam Vitals and nursing note reviewed.  Constitutional:       General: She is not in acute distress.    Appearance: Normal appearance. She is not ill-appearing.  HENT:     Head: Normocephalic and atraumatic.  Eyes:     Conjunctiva/sclera: Conjunctivae normal.  Cardiovascular:     Rate and Rhythm: Normal rate.  Pulmonary:     Effort: Pulmonary effort is normal.  Musculoskeletal:     Comments: No midline spine TTP, TTP noted to right lower back, no TTP to left lower back  Neurological:     Mental Status: She is alert.  Psychiatric:        Mood and Affect: Mood normal.        Behavior: Behavior normal.        Thought Content: Thought content normal.     UC Treatments / Results  Labs (all labs ordered are listed, but only abnormal results are displayed) Labs Reviewed - No data to display  EKG   Radiology No results found.  Procedures Procedures (including critical care time)  Medications Ordered in UC Medications - No data to display  Initial Impression / Assessment and Plan / UC Course  I have reviewed the triage vital signs and the nursing notes.  Pertinent labs & imaging results that were available during my care of the patient were reviewed by me and considered in my medical decision making (see chart for details).    Suspect sciatica- will treat with low dose steroid burst and muscle relaxer. Discussed that steroid my increase glucose levels and advised she monitor same. Recommend follow up with PCP. Encouraged follow up here sooner with any concerns.   Final Clinical Impressions(s) / UC Diagnoses   Final diagnoses:  Acute right-sided low back pain with right-sided sciatica   Discharge Instructions   None    ED Prescriptions   None    PDMP not reviewed this encounter.   Francene Finders, PA-C 05/02/21 1333

## 2021-05-02 NOTE — ED Triage Notes (Signed)
Patient c/o low right sided back pain that radiates down right leg to her knee x 1-2 weeks.  No apparent injury.  Patient has taken Tylenol, applied warm compresses w/o relief.

## 2021-06-13 ENCOUNTER — Ambulatory Visit
Admission: RE | Admit: 2021-06-13 | Discharge: 2021-06-13 | Disposition: A | Discharge status: Home / Self Care | Payer: Medicaid Other | Source: Ambulatory Visit | Attending: Physician Assistant | Admitting: Physician Assistant

## 2021-06-13 VITALS — BP 155/88 | HR 76 | Temp 98.3°F | Resp 16

## 2021-06-13 DIAGNOSIS — M5441 Lumbago with sciatica, right side: Secondary | ICD-10-CM

## 2021-06-13 MED ORDER — PREDNISONE 20 MG PO TABS: 40.0000 mg | ORAL_TABLET | Freq: Every day | ORAL | 0 refills | Status: AC

## 2021-06-13 MED ORDER — CYCLOBENZAPRINE HCL 10 MG PO TABS: 10.0000 mg | ORAL_TABLET | Freq: Two times a day (BID) | ORAL | 0 refills | Status: DC | PRN

## 2021-06-13 NOTE — ED Triage Notes (Signed)
Saw PCP for back pain. Had a 3 day supply of medication given, wouldn't fill full prescription according to pt due to dosage being too high. 3 day prescription is up, and she's continuing to have pain. Right sided lower back pain radiating down right leg. Denies injury to back.  ?

## 2021-06-13 NOTE — ED Provider Notes (Signed)
?[name] ? ? ? ?[name]: [id number] ?Arrival date & time: 06/13/21  1128 ? ? ?  ? ?History   ?Chief Complaint ?Chief Complaint  ?Patient presents with  ? Back Pain  ?  Lower back pain as well in thigh and knee pain - Entered by patient  ? ? ?HPI ?Deborah Mcclain is a 51 y.o. female.  ? ?Patient here today for evaluation of right sided back pain that radiates into her right leg that feels similar to prior sciatica pain. She was seen by her PCP recently who prescribed her cyclobenzaprine but due to 15 mg dose insurance has refused to cover. She continues to have discomfort now that she is out of medication. She has not had any numbness or tingling. She does not report any weakness. She denies any injury prior to onset of back pain.  ? ?The history is provided by the patient.  ?Back Pain ?Associated symptoms: no abdominal pain, no fever, no numbness and no weakness   ? ?Past Medical History:  ?Diagnosis Date  ? Asthma   ? Eczema   ? Hypertension   ? Type 2 diabetes mellitus ([facility]) 12/2018  ? ? ?Patient Active Problem List  ? Diagnosis Date Noted  ? Pain due to onychomycosis of toenails of both feet 12/02/2020  ? Hyperglycemia due to diabetes mellitus ([facility]) 01/09/2020  ? Anemia 12/03/2019  ? Abnormal uterine bleeding (AUB) 12/03/2019  ? History of blood transfusion 12/03/2019  ? Essential hypertension 04/16/2019  ? Diabetes mellitus type 2 in obese ([facility]) 04/16/2019  ? Morbid obesity with BMI of 50.0-59.9, adult ([facility]) 04/16/2019  ? Menopausal vasomotor syndrome 04/16/2019  ? ? ?Past Surgical History:  ?Procedure Laterality Date  ? CESAREAN SECTION  05/25/1997  ? CESAREAN SECTION  03/30/2005  ? twins  ? ENDOMETRIAL BIOPSY  12/03/2019  ? ? ?OB History   ? ? Gravida  ?2  ? Para  ?2  ? Term  ?1  ? Preterm  ?1  ? AB  ?   ? Living  ?3  ?  ? ? SAB  ?   ? IAB  ?   ? Ectopic  ?   ? Multiple  ?1  ? Live Births  ?3  ?   ?  ?  ? ? ? ?Home Medications   ? ?Prior to Admission medications   ?Medication Sig Start Date End Date  Taking? Authorizing Provider  ?cyclobenzaprine (FLEXERIL) 10 MG tablet Take 1 tablet (10 mg total) by mouth 2 (two) times daily as needed for muscle spasms. 06/13/21  Yes Tomi Bamberger, PA-C  ?predniSONE (DELTASONE) 20 MG tablet Take 2 tablets (40 mg total) by mouth daily with breakfast for 5 days. 06/13/21 06/18/21 Yes Tomi Bamberger, PA-C  ?albuterol (VENTOLIN HFA) 108 (90 Base) MCG/ACT inhaler INHALE 1 TO 2 PUFFS BY MOUTH EVERY 4 HOURS AS NEEDED 07/01/19   [provider]  ?amoxicillin (AMOXIL) 875 MG tablet Take 1 tablet (875 mg total) by mouth 2 (two) times daily. 10/26/20   Wallis Bamberg, PA-C  ?atorvastatin (LIPITOR) 40 MG tablet Take 1 tablet (40 mg total) by mouth at bedtime. 01/12/20   Ghimire, Werner Lean, MD  ?cetirizine (ZYRTEC) 10 MG tablet Take 1 tablet (10 mg total) by mouth daily. 01/16/18   Dahlia Byes A, NP  ?ferrous sulfate 325 (65 FE) MG tablet Take 1 tablet (325 mg total) by mouth daily. 11/29/19   Jacalyn Lefevre, MD  ?gabapentin (NEURONTIN) 300 MG capsule Take 1 capsule (  300 mg total) by mouth 3 (three) times daily. 10/26/20   Wallis Bamberg, PA-C  ?hydrOXYzine (ATARAX/VISTARIL) 25 MG tablet Take 1 tablet (25 mg total) by mouth every 6 (six) hours as needed for itching. 07/24/20   Wieters, Hallie C, PA-C  ?insulin aspart (NOVOLOG) 100 UNIT/ML FlexPen 0-20 Units, Subcutaneous, 3 times daily with meals CBG < 70: Implement Hypoglycemia measures, call MD CBG 70 - 120: 0 units CBG 121 - 150: 3 units CBG 151 - 200: 4 units CBG 201 - 250: 7 units CBG 251 - 300: 11 units CBG 301 - 350: 15 units CBG 351 - 400: 20 units CBG > 400: call MD 06/30/20   Bing Neighbors, FNP  ?Insulin Pen Needle 32G X 4 MM MISC 1 each by Does not apply route as needed. 01/12/20   Ghimire, Werner Lean, MD  ?ketorolac (TORADOL) 10 MG tablet Take 1 tablet (10 mg total) by mouth every 6 (six) hours as needed. 06/30/20   Bing Neighbors, FNP  ?LANTUS SOLOSTAR 100 UNIT/ML Solostar Pen 46 units in am and 36 units in pm 01/12/20    Ghimire, Werner Lean, MD  ?megestrol (MEGACE) 40 MG tablet Take 1 tablet (40 mg total) by mouth daily. Can increase to two tablets twice a day in the event of heavy bleeding 03/27/20   Hermina Staggers, MD  ?metFORMIN (GLUCOPHAGE) 1000 MG tablet Take 1,000 mg by mouth 2 (two) times daily with a meal.     [provider]  ?metoprolol tartrate (LOPRESSOR) 50 MG tablet Take 1 tablet (50 mg total) by mouth 2 (two) times daily. 01/12/20   Ghimire, Werner Lean, MD  ?nystatin (MYCOSTATIN/NYSTOP) powder Apply 1 application topically 3 (three) times daily. 07/24/20   Wieters, Hallie C, PA-C  ?triamcinolone cream (KENALOG) 0.1 % Apply 1 application topically 2 (two) times daily. 07/24/20   Wieters, Hallie C, PA-C  ?venlafaxine XR (EFFEXOR-XR) 75 MG 24 hr capsule Take 1 capsule (75 mg total) by mouth daily. 12/26/19   Leftwich-Kirby, Wilmer Floor, CNM  ? ? ?Family History ?Family History  ?Problem Relation Age of Onset  ? Breast cancer Mother   ? Diabetes Mother   ? Hypertension Mother   ? Diabetes Maternal Grandmother   ? ? ?Social History ?Social History  ? ?Tobacco Use  ? Smoking status: Never  ? Smokeless tobacco: Never  ?Vaping Use  ? Vaping Use: Never used  ?Substance Use Topics  ? Alcohol use: Yes  ? Drug use: Never  ? ? ? ?Allergies   ?Other ? ? ?Review of Systems ?Review of Systems  ?Constitutional:  Negative for chills and fever.  ?Eyes:  Negative for discharge and redness.  ?Gastrointestinal:  Negative for abdominal pain, nausea and vomiting.  ?Musculoskeletal:  Positive for back pain and myalgias.  ?Neurological:  Negative for weakness and numbness.  ? ? ?Physical Exam ?Triage Vital Signs ?ED Triage Vitals  ?Enc Vitals Group  ?   BP 06/13/21 1216 (!) 155/88  ?   Pulse Rate 06/13/21 1216 76  ?   Resp 06/13/21 1216 16  ?   Temp 06/13/21 1216 98.3 ?F (36.8 ?C)  ?   Temp Source 06/13/21 1216 Oral  ?   SpO2 06/13/21 1216 97 %  ?   Weight --   ?   Height --   ?   Head Circumference --   ?   Peak Flow --   ?   Pain Score  06/13/21 1219 9  ?  Pain Loc --   ?   Pain Edu? --   ?   Excl. in [place]? --   ? ?No data found. ? ?Updated Vital Signs ?BP (!) 155/88 (BP Location: Left Arm)   Pulse 76   Temp 98.3 ?F (36.8 ?C) (Oral)   Resp 16   SpO2 97%    ? ?Physical Exam ?Vitals and nursing note reviewed.  ?Constitutional:   ?   General: She is not in acute distress. ?   Appearance: Normal appearance. She is not ill-appearing.  ?HENT:  ?   Head: Normocephalic and atraumatic.  ?Eyes:  ?   Conjunctiva/sclera: Conjunctivae normal.  ?Cardiovascular:  ?   Rate and Rhythm: Normal rate.  ?Pulmonary:  ?   Effort: Pulmonary effort is normal.  ?Musculoskeletal:  ?   Comments: No TTP to midline spine, Mild TTP noted to lower right back  ?Neurological:  ?   Mental Status: She is alert.  ?Psychiatric:     ?   Mood and Affect: Mood normal.     ?   Behavior: Behavior normal.     ?   Thought Content: Thought content normal.  ? ? ? ?UC Treatments / Results  ?Labs ?(all labs ordered are listed, but only abnormal results are displayed) ?Labs Reviewed - No data to display ? ?EKG ? ? ?Radiology ?No results found. ? ?Procedures ?Procedures (including critical care time) ? ?Medications Ordered in UC ?Medications - No data to display ? ?Initial Impression / Assessment and Plan / UC Course  ?I have reviewed the triage vital signs and the nursing notes. ? ?Pertinent labs & imaging results that were available during my care of the patient were reviewed by me and considered in my medical decision making (see chart for details). ? ?  ?Will treat with lower dose cyclobenzaprine and steroid burst as this has worked well for patient in the past. Encouraged follow up with any further concerns.  ? ?Final Clinical Impressions(s) / UC Diagnoses  ? ?Final diagnoses:  ?Acute right-sided low back pain with right-sided sciatica  ? ?Discharge Instructions   ?None ?  ? ?ED Prescriptions   ? ? Medication Sig Dispense Auth. Provider  ? predniSONE (DELTASONE) 20 MG tablet Take 2 tablets  (40 mg total) by mouth daily with breakfast for 5 days. 10 tablet Tomi BambergerMyers, Carlina Derks F, PA-C  ? cyclobenzaprine (FLEXERIL) 10 MG tablet Take 1 tablet (10 mg total) by mouth 2 (two) times daily as needed for muscle spa

## 2021-07-12 DIAGNOSIS — M5430 Sciatica, unspecified side: Secondary | ICD-10-CM | POA: Insufficient documentation

## 2021-07-12 DIAGNOSIS — G8929 Other chronic pain: Secondary | ICD-10-CM | POA: Insufficient documentation

## 2021-07-21 ENCOUNTER — Ambulatory Visit: Payer: Medicaid Other

## 2021-07-28 ENCOUNTER — Ambulatory Visit: Payer: Medicaid Other

## 2021-07-29 NOTE — Therapy (Signed)
OUTPATIENT PHYSICAL THERAPY THORACOLUMBAR EVALUATION   Patient Name: Deborah Mcclain MRN: [id number] DOB:Aug 27, 1970, 51 y.o., female Today's Date: 08/03/2021   PT End of Session - 08/03/21 1530     Visit Number 1    Number of Visits 13    Date for PT Re-Evaluation 09/14/21    Authorization Type [employer]    PT Start Time 1330    PT Stop Time 1427    PT Time Calculation (min) 57 min    Activity Tolerance Patient tolerated treatment well    [facility] tasks assessed/performed             Past Medical History:  Diagnosis Date   Asthma    Eczema    Hypertension    Type 2 diabetes mellitus (HCC) 12/2018   Past Surgical History:  Procedure Laterality Date   CESAREAN SECTION  05/25/1997   CESAREAN SECTION  03/30/2005   twins   ENDOMETRIAL BIOPSY  12/03/2019   Patient Active Problem List   Diagnosis Date Noted   Pain due to onychomycosis of toenails of both feet 12/02/2020   Hyperglycemia due to diabetes mellitus (HCC) 01/09/2020   Anemia 12/03/2019   Abnormal uterine bleeding (AUB) 12/03/2019   History of blood transfusion 12/03/2019   Essential hypertension 04/16/2019   Diabetes mellitus type 2 in obese (HCC) 04/16/2019   Morbid obesity with BMI of 50.0-59.9, adult (HCC) 04/16/2019   Menopausal vasomotor syndrome 04/16/2019    PCP: Laruth BouchardLott, Tamika MD  REFERRING PROVIDER: Verlon Auobinson, Carmen T, MD  REFERRING DIAG: Sciatica   THERAPY DIAG:  Chronic right-sided low back pain with right-sided sciatica  Muscle weakness (generalized)  Abnormal posture  Difficulty in walking, not elsewhere classified  ONSET DATE:  3 years or more but exacerbating for last 3 months since FEb 2023  SUBJECTIVE:                                                                                                                                                                                           SUBJECTIVE STATEMENT: I have had back pain for a long time but it  has really increased in the last 3 months with with R side back pain when I am walking a lot.  I have a son with spina bifida in W/C. Son can sit up on his own and transfer himself but sometimes he needs my assistance.  I am hurting today and needing a muscle relaxer today.  I am worried about my mother having memory issues and I now live with her in her home. Having trouble standing longer than 15 min to do chores,  cant sit longer than 15 min without discomfort PERTINENT HISTORY:  Asthma, HTN, DMII, has son with spina bifida that is W/C bound. (53 yo),  diabetic neuropathy of feet  PAIN:  Are you having pain? Yes: NPRS scale: at rest 6/10 on muscle relaxers and at worst it is 9/10 when I am walking too much/10 Pain location: R low back radiates to the knee Pain description: sharp pain Aggravating factors: after mopping floor, walking too long, lifting my son, standing longer  than 15 -20 min, sometimes sitting on my bed 20 minutes Relieving factors: warm shower or hot pack   PRECAUTIONS: None  WEIGHT BEARING RESTRICTIONS No  FALLS:  Has patient fallen in last 6 months? Yes. Number of falls 1  ONe month ago I came out of my bathroom and slipped and fell. She slipped and fell on water  LIVING ENVIRONMENT: Lives with: lives with their family Lives in: House/apartment Stairs: No Has following equipment at home: None  OCCUPATION: Not working because I care for my son who is on disability  PLOF: Independent  PATIENT GOALS  I would like to start walking and doing exercise for weight reduction. Right now you are unable to exercise   OBJECTIVE:   DIAGNOSTIC FINDINGS: 10-26-20 FINDINGS: Five lumbar type vertebral bodies. Vertebral body heights and disc spaces are maintained. There is moderate facet arthropathy which is worse on the RIGHT at the L4-5 and L5-S1 levels. No sign of acute abnormality in the lumbar spine. Mild anterior osteophytes throughout the lumbar spine.    IMPRESSION: Degenerative changes in the lumbar spine as described. No acute findings  PATIENT SURVEYS:  ODI  ODI Score = 11 points (22%)  ? Interpretation: 21% to 40% (moderate disability): Patients may experience more pain and problems with sitting, lifting and standing. Travel and social life are more difficult. Patients may be off work. Personal care, sleeping and sexual activity may not be grossly affected. Conservative treatment may be sufficient.  SCREENING FOR RED FLAGS: Bowel or bladder incontinence: No   COGNITION:  Overall cognitive status: Within functional limits for tasks assessed     SENSATION: diabetic neuropathy of feet  MUSCLE LENGTH: Hamstrings: Right 45 deg; Left 58 deg   POSTURE: rounded shoulders, forward head, decreased lumbar lordosis, and posterior pelvic tilt Pt with Left pelvic level greater than R Posterior pelvic tilt  PALPATION: Painful TTP with R and L Quadratus lumborum  LUMBAR ROM:   Active  A/PROM  eval  Flexion Fingertips to shins, pain in center low back  Extension 50%   Right lateral flexion Fingertip to 1.5 knee jt line   Left lateral flexion Fingertip to 1 inch above knee jt line  Right rotation Pain on R side 50%  Left rotation No pain 75%   (Blank rows = not tested)  LOWER EXTREMITY ROM:     Active  Right eval Left eval  Hip flexion 90 with pain 90  Hip extension    Hip abduction    Hip adduction    Hip internal rotation 20 18  Hip external rotation 40 45  Knee flexion    Knee extension    Ankle dorsiflexion    Ankle plantarflexion    Ankle inversion    Ankle eversion     (Blank rows = not tested)  LOWER EXTREMITY MMT:    MMT Right eval Left eval  Hip flexion 3- 3-  Hip extension 2+ 3-  Hip abduction 3- 4-  Hip adduction  Hip internal rotation    Hip external rotation    Knee flexion 4+ 4+  Knee extension 4 4+  Ankle dorsiflexion 4 4+  Ankle plantarflexion    Ankle inversion    Ankle eversion      (Blank rows = not tested)  LUMBAR SPECIAL TESTS:  Straight leg raise test: Positive, Slump test: Negative, and FABER test: Positive on R  FUNCTIONAL TESTS:  5 times sit to stand: 14.73 sec 6 minute walk test: TBD  GAIT: Distance walked: 150 Assistive device utilized: None Level of assistance: Complete Independence Comments: Pt with decrease stride length and antalgic gait on R    TODAY'S TREATMENT  08-03-21 Eval and issue of HEP as well as TPDN Manual-  STW R QL, R piriformis and R Gluteals  Trigger Point Dry Needling Treatment: Pre-treatment instruction: Patient instructed on dry needling rationale, procedures, and possible side effects including pain during treatment (achy,cramping feeling), bruising, drop of blood, lightheadedness, nausea, sweating. Patient Consent Given: Yes Education handout provided: Yes Muscles treated: R QL and R piriformis and R Gluteals  Needle size and number: .30x50mm x 2 Electrical stimulation performed: No Parameters: N/A Treatment response/outcome: Twitch response elicited and Palpable decrease in muscle tension Post-treatment instructions: Patient instructed to expect possible mild to moderate muscle soreness later today and/or tomorrow. Patient instructed in methods to reduce muscle soreness and to continue prescribed HEP. If patient was dry needled over the lung field, patient was instructed on signs and symptoms of pneumothorax and, however unlikely, to see immediate medical attention should they occur. Patient was also educated on signs and symptoms of infection and to seek medical attention should they occur. Patient verbalized understanding of these instructions and education.    PATIENT EDUCATION:  Education details: POC Explanation of findings, TPDN education, Issue HEP Person educated: Patient Education method: Explanation, Demonstration, Tactile cues, Verbal cues, and Handouts Education comprehension: verbalized understanding, returned  demonstration, verbal cues required, tactile cues required, and needs further education   HOME EXERCISE PROGRAM: Access Code: [email or web address] URL: [email or web address] Date: 08/03/2021 Prepared by: Garen Lah  Exercises - Hooklying Single Knee to Chest Stretch  - 3 x daily - 7 x weekly - 1 sets - 5 reps - 10 hold - Child's Pose Stretch  - 3 x daily - 7 x weekly - 1 sets - 10 reps - 5 hold - Seated Lumbar Flexion Stretch  - 3 x daily - 7 x weekly - 1 sets - 5 reps - 5-10 hold - Supine Pelvic Tilt  - 1 x daily - 7 x weekly - 3 sets - 10 reps - Supine Lower Trunk Rotation  - 2 x daily - 7 x weekly - 1 sets - 5 reps - 20 hold  Patient Education - Low Back Pain  ASSESSMENT:  CLINICAL IMPRESSION: Patient is a 51 y.o. female who was seen today for physical therapy evaluation and treatment for R sided sciatica with increasing pain for the last 3 months but a hx of about 3 years. Pt has 63 yo W/C bound son with spina bifida and has also been needing to transfer him in some situations.  Pt reports that she has now been able to exercise and has gained weight as well.  Ms Adelsberger is unable to sit , stand for greater than 15 minutes. Pt with decrease in pain with flexion based exercise. Pt will benefit from skilled PT to address impairments and strengthen for increased movement and consistent exercise.   OBJECTIVE IMPAIRMENTS decreased  activity tolerance, decreased mobility, difficulty walking, decreased ROM, decreased strength, impaired flexibility, improper body mechanics, postural dysfunction, obesity, and pain.   ACTIVITY LIMITATIONS cleaning, laundry, shopping, and lifting disabled son .   PERSONAL FACTORS Asthma, HTN, DMII, has son with spina bifida that is W/C bound. (53 yo),  diabetic neuropathy of feet are also affecting patient's functional outcome.    REHAB POTENTIAL: Good  CLINICAL DECISION MAKING: Evolving/moderate complexity  EVALUATION COMPLEXITY:  Moderate   GOALS: Goals reviewed with patient? Yes  SHORT TERM GOALS: Target date: 08-24-21  Independent with initial HEP Baseline:no knowledge Goal status: INITIAL  2.  Report pain decrease from 9/10 to 5/10 with functional activities at home Baseline: at worst 9/10 and standing for greater than 15 minutes Goal status: INITIAL  3.  Pt will be educated on better lifting techniques for adult son W/C bound with spina bifida to prevent injury Baseline: Need re instruction and demo for safety Goal status: INITIAL  4.  Demonstrate understanding of neutral posture and be more conscious of position and posture throughout the day.  Baseline: Pt with flat back and sacral sits Goal status: INITIAL    LONG TERM GOALS: Target date: 09-14-21  Demonstrate and verbalize techniques to reduce the risk of re-injury including: lifting, posture, body mechanics.  Baseline: limited knowledge Goal status: INITIAL  2.  Pt will be independent with advanced HEP Baseline: no knowledge Goal status: INITIAL  3.  Pt will be able to complete household chores with pain  4/10 or less Baseline: after 15 min of washing dishes can be 9/10 pain Goal status: INITIAL  4.  Pt will be able to implement HEP including walking program to be able to walk for 1 mile Baseline: Not walking presently for exercise Goal status: INITIAL  5.  Pt will be able to pick up 20 # groceries from floor without exacerbating pain greater than 3/10 Baseline:  Goal status: INITIAL  6.  ODI will improve to  less than 10% Baseline: EVAL 22% Goal status: INITIAL   PLAN: PT FREQUENCY: 2x/week  PT DURATION: 6 weeks  PLANNED INTERVENTIONS: Therapeutic exercises, Therapeutic activity, Neuromuscular re-education, Balance training, Gait training, Patient/Family education, Joint mobilization, Dry Needling, Electrical stimulation, Spinal mobilization, Cryotherapy, Moist heat, Taping, Traction, Manual therapy, and Re-evaluation.  PLAN  FOR NEXT SESSION: Assess TPDN, Review HEP and progress,  sciatic nerve glides  Check all possible CPT codes: 16109 - Re-evaluation, 97110- Therapeutic Exercise, [phone]- Neuro Re-education, [phone] - Gait Training, [phone] - Manual Therapy, 97530 - Therapeutic Activities, 97535 - Self Care, [phone] - Mechanical traction, 97014 - Electrical stimulation (unattended), [id number] - Electrical stimulation (Manual), [id number] - Iontophoresis, and 97750 - Physical performance training     If treatment provided at initial evaluation, no treatment charged due to lack of authorization.       Garen Lah, PT, A[facility] Certified Exercise Expert for the Aging Adult  08/03/21 3:35 PM Phone: [phone] Fax: [phone]

## 2021-08-03 ENCOUNTER — Ambulatory Visit: Payer: Medicaid Other | Attending: Family Medicine | Admitting: Physical Therapy

## 2021-08-03 DIAGNOSIS — M6281 Muscle weakness (generalized): Secondary | ICD-10-CM | POA: Insufficient documentation

## 2021-08-03 DIAGNOSIS — R293 Abnormal posture: Secondary | ICD-10-CM | POA: Insufficient documentation

## 2021-08-03 DIAGNOSIS — G8929 Other chronic pain: Secondary | ICD-10-CM | POA: Diagnosis present

## 2021-08-03 DIAGNOSIS — R262 Difficulty in walking, not elsewhere classified: Secondary | ICD-10-CM | POA: Diagnosis present

## 2021-08-03 DIAGNOSIS — M5441 Lumbago with sciatica, right side: Secondary | ICD-10-CM | POA: Diagnosis present

## 2021-08-03 NOTE — Patient Instructions (Signed)

## 2021-08-10 ENCOUNTER — Ambulatory Visit: Payer: Medicaid Other | Admitting: Physical Therapy

## 2021-08-17 ENCOUNTER — Other Ambulatory Visit (HOSPITAL_COMMUNITY)
Admission: RE | Admit: 2021-08-17 | Discharge: 2021-08-17 | Disposition: A | Discharge status: Home / Self Care | Payer: Medicaid Other | Source: Ambulatory Visit | Attending: Obstetrics | Admitting: Obstetrics

## 2021-08-17 ENCOUNTER — Ambulatory Visit (INDEPENDENT_AMBULATORY_CARE_PROVIDER_SITE_OTHER): Payer: Medicaid Other | Admitting: Obstetrics

## 2021-08-17 ENCOUNTER — Encounter: Payer: Self-pay | Admitting: Obstetrics

## 2021-08-17 VITALS — BP 118/80 | HR 79 | Ht 64.0 in | Wt 276.2 lb

## 2021-08-17 DIAGNOSIS — B369 Superficial mycosis, unspecified: Secondary | ICD-10-CM

## 2021-08-17 DIAGNOSIS — N950 Postmenopausal bleeding: Secondary | ICD-10-CM | POA: Diagnosis not present

## 2021-08-17 DIAGNOSIS — N951 Menopausal and female climacteric states: Secondary | ICD-10-CM | POA: Diagnosis not present

## 2021-08-17 DIAGNOSIS — Z6841 Body Mass Index (BMI) 40.0 and over, adult: Secondary | ICD-10-CM

## 2021-08-17 DIAGNOSIS — N898 Other specified noninflammatory disorders of vagina: Secondary | ICD-10-CM | POA: Insufficient documentation

## 2021-08-17 DIAGNOSIS — E1169 Type 2 diabetes mellitus with other specified complication: Secondary | ICD-10-CM | POA: Diagnosis not present

## 2021-08-17 DIAGNOSIS — E669 Obesity, unspecified: Secondary | ICD-10-CM

## 2021-08-17 MED ORDER — FLUCONAZOLE 200 MG PO TABS: 200.0000 mg | ORAL_TABLET | ORAL | 5 refills | Status: DC

## 2021-08-17 NOTE — Progress Notes (Signed)
Patient presents for having postmenopausal bleeding. States that she has no had a cycle for about a year and a half. She states that she had some bleeding about 2 weeks ago that lasted for 3 days and was enough to wear a pad for all 3 days.   Last pap 04/16/19 Normal

## 2021-08-17 NOTE — Progress Notes (Unsigned)
Patient ID: Deborah Mcclain, female   DOB: 11-12-1970, 51 y.o.   MRN: [id number]  Chief Complaint  Patient presents with   Gynecologic Exam    HPI Deborah Mcclain is a 51 y.o. female.  Presents after an episode of vaginal bleeding for 3-4 days like a period.  She had not had a period for about a year and a half.  She had an episode of AUB in 2021 that was treated with Megace, and she had no periods or AUB after finishing the Megace.  Endometrial biopsy done in 2021 was normal. HPI  Past Medical History:  Diagnosis Date   Asthma    Eczema    Hypertension    Type 2 diabetes mellitus (HCC) 12/2018    Past Surgical History:  Procedure Laterality Date   CESAREAN SECTION  05/25/1997   CESAREAN SECTION  03/30/2005   twins   ENDOMETRIAL BIOPSY  12/03/2019    Family History  Problem Relation Age of Onset   Breast cancer Mother    Diabetes Mother    Hypertension Mother    Diabetes Maternal Grandmother     Social History Social History   Tobacco Use   Smoking status: Never   Smokeless tobacco: Never  Vaping Use   Vaping Use: Never used  Substance Use Topics   Alcohol use: Yes    Comment: occ   Drug use: Never    Allergies  Allergen Reactions   Other Itching    Ketchup    Current Outpatient Medications  Medication Sig Dispense Refill   albuterol (VENTOLIN HFA) 108 (90 Base) MCG/ACT inhaler INHALE 1 TO 2 PUFFS BY MOUTH EVERY 4 HOURS AS NEEDED     amLODipine (NORVASC) 10 MG tablet Take 10 mg by mouth daily.     atorvastatin (LIPITOR) 40 MG tablet Take 1 tablet (40 mg total) by mouth at bedtime. 30 tablet 0   cetirizine (ZYRTEC) 10 MG tablet Take 1 tablet (10 mg total) by mouth daily. 30 tablet 0   cyclobenzaprine (FLEXERIL) 10 MG tablet Take 1 tablet (10 mg total) by mouth 2 (two) times daily as needed for muscle spasms. 20 tablet 0   fluconazole (DIFLUCAN) 200 MG tablet Take 1 tablet (200 mg total) by mouth every 3 (three) days. 3 tablet 5   hydrOXYzine (ATARAX/VISTARIL)  25 MG tablet Take 1 tablet (25 mg total) by mouth every 6 (six) hours as needed for itching. 12 tablet 0   insulin aspart (NOVOLOG) 100 UNIT/ML FlexPen 0-20 Units, Subcutaneous, 3 times daily with meals CBG < 70: Implement Hypoglycemia measures, call MD CBG 70 - 120: 0 units CBG 121 - 150: 3 units CBG 151 - 200: 4 units CBG 201 - 250: 7 units CBG 251 - 300: 11 units CBG 301 - 350: 15 units CBG 351 - 400: 20 units CBG > 400: call MD 15 mL 11   Insulin Pen Needle 32G X 4 MM MISC 1 each by Does not apply route as needed. 200 each 0   LANTUS SOLOSTAR 100 UNIT/ML Solostar Pen 46 units in am and 36 units in pm 15 mL 11   metFORMIN (GLUCOPHAGE) 1000 MG tablet Take 1,000 mg by mouth 2 (two) times daily with a meal.      metoprolol tartrate (LOPRESSOR) 50 MG tablet Take 1 tablet (50 mg total) by mouth 2 (two) times daily. 60 tablet 0   amoxicillin (AMOXIL) 875 MG tablet Take 1 tablet (875 mg total) by mouth 2 (two) times daily. 14  tablet 0   ferrous sulfate 325 (65 FE) MG tablet Take 1 tablet (325 mg total) by mouth daily. 30 tablet 0   gabapentin (NEURONTIN) 300 MG capsule Take 1 capsule (300 mg total) by mouth 3 (three) times daily. 60 capsule 0   ketorolac (TORADOL) 10 MG tablet Take 1 tablet (10 mg total) by mouth every 6 (six) hours as needed. 20 tablet 0   megestrol (MEGACE) 40 MG tablet Take 1 tablet (40 mg total) by mouth daily. Can increase to two tablets twice a day in the event of heavy bleeding 60 tablet 5   nystatin (MYCOSTATIN/NYSTOP) powder Apply 1 application topically 3 (three) times daily. 30 g 0   triamcinolone cream (KENALOG) 0.1 % Apply 1 application topically 2 (two) times daily. (Patient not taking: Reported on 08/17/2021) 30 g 0   venlafaxine XR (EFFEXOR-XR) 75 MG 24 hr capsule Take 1 capsule (75 mg total) by mouth daily. (Patient not taking: Reported on 08/17/2021) 30 capsule 11   No current facility-administered medications for this visit.    Review of Systems Review of  Systems Constitutional: negative for fatigue and weight loss Respiratory: negative for cough and wheezing Cardiovascular: negative for chest pain, fatigue and palpitations Gastrointestinal: negative for abdominal pain and change in bowel habits Genitourinary: positive for vaginal bleeding and vaginal discharge with irritation Integument/breast: negative for nipple discharge Musculoskeletal:negative for myalgias Neurological: negative for gait problems and tremors Behavioral/Psych: negative for abusive relationship, depression Endocrine: negative for temperature intolerance      Blood pressure 118/80, pulse 79, height 5\' 4"  (1.626 m), weight 276 lb 3.2 oz (125.3 kg), last menstrual period 01/31/2020.  Physical Exam Physical Exam General:   Alert and no distress  Skin:   no rash or abnormalities  Lungs:   clear to auscultation bilaterally  Heart:   regular rate and rhythm, S1, S2 normal, no murmur, click, rub or gallop  Breasts:   normal without suspicious masses, skin or nipple changes or axillary nodes  Abdomen:  normal findings: no organomegaly, soft, non-tender and no hernia  Pelvis:  External genitalia: normal general appearance Urinary system: urethral meatus normal and bladder without fullness, nontender Vaginal: normal without tenderness, induration or masses Cervix: normal appearance.  IUD string visible Adnexa: normal bimanual exam Uterus: anteverted and non-tender, normal size    I have spent a total of 30 minutes of face-to-face time, excluding clinical staff time, reviewing notes and preparing to see patient, ordering tests and/or medications, and counseling the patient.   Data Reviewed Labs Pathology  Assessment         Plan    Orders Placed This Encounter  Procedures   02/02/2020 PELVIC COMPLETE WITH TRANSVAGINAL    Standing Status:   Future    Standing Expiration Date:   08/18/2022    Order Specific Question:   Reason for Exam (SYMPTOM  OR DIAGNOSIS REQUIRED)     Answer:   Postmenopausal bleeding    Order Specific Question:   Preferred imaging location?    Answer:   [facility]   Meds ordered this encounter  Medications   fluconazole (DIFLUCAN) 200 MG tablet    Sig: Take 1 tablet (200 mg total) by mouth every 3 (three) days.    Dispense:  3 tablet    Refill:  5      10/18/2022, MD 08/17/2021 12:02 PM

## 2021-08-18 LAB — CERVICOVAGINAL ANCILLARY ONLY
Bacterial Vaginitis (gardnerella): NEGATIVE
Candida Glabrata: NEGATIVE
Candida Vaginitis: POSITIVE — AB
Comment: NEGATIVE
Comment: NEGATIVE
Comment: NEGATIVE

## 2021-08-19 ENCOUNTER — Other Ambulatory Visit: Payer: Self-pay | Admitting: Obstetrics

## 2021-08-20 ENCOUNTER — Ambulatory Visit: Payer: Medicaid Other | Admitting: Physical Therapy

## 2021-08-25 ENCOUNTER — Ambulatory Visit: Payer: Medicaid Other | Attending: Family Medicine | Admitting: Physical Therapy

## 2021-08-25 ENCOUNTER — Encounter: Payer: Self-pay | Admitting: Physical Therapy

## 2021-08-25 DIAGNOSIS — M6281 Muscle weakness (generalized): Secondary | ICD-10-CM | POA: Diagnosis present

## 2021-08-25 DIAGNOSIS — G8929 Other chronic pain: Secondary | ICD-10-CM | POA: Insufficient documentation

## 2021-08-25 DIAGNOSIS — R262 Difficulty in walking, not elsewhere classified: Secondary | ICD-10-CM | POA: Insufficient documentation

## 2021-08-25 DIAGNOSIS — R293 Abnormal posture: Secondary | ICD-10-CM | POA: Insufficient documentation

## 2021-08-25 DIAGNOSIS — M5441 Lumbago with sciatica, right side: Secondary | ICD-10-CM | POA: Diagnosis present

## 2021-08-25 NOTE — Therapy (Signed)
OUTPATIENT PHYSICAL THERAPY TREATMENT NOTE   Patient Name: Deborah Mcclain MRN: [id number] DOB:[birth year], 51 y.o., female Today's Date: 08/25/2021  PCP: Laruth Bouchard MD REFERRING PROVIDER: Verlon Au, MD  END OF SESSION:   PT End of Session - 08/25/21 1404     Visit Number 2    Number of Visits 13    Date for PT Re-Evaluation 09/14/21    Authorization - Visit Number 1    Authorization - Number of Visits 12    PT Start Time 1400    Activity Tolerance Patient tolerated treatment well    Behavior During Therapy [facility] for tasks assessed/performed             Past Medical History:  Diagnosis Date   Asthma    Eczema    Hypertension    Type 2 diabetes mellitus (HCC) 12/2018   Past Surgical History:  Procedure Laterality Date   CESAREAN SECTION  05/25/1997   CESAREAN SECTION  03/30/2005   twins   ENDOMETRIAL BIOPSY  12/03/2019   Patient Active Problem List   Diagnosis Date Noted   Pain due to onychomycosis of toenails of both feet 12/02/2020   Hyperglycemia due to diabetes mellitus (HCC) 01/09/2020   Anemia 12/03/2019   Abnormal uterine bleeding (AUB) 12/03/2019   History of blood transfusion 12/03/2019   Essential hypertension 04/16/2019   Diabetes mellitus type 2 in obese (HCC) 04/16/2019   Morbid obesity with BMI of 50.0-59.9, adult (HCC) 04/16/2019   Menopausal vasomotor syndrome 04/16/2019    REFERRING DIAG: Sciatica   THERAPY DIAG:  Chronic right-sided low back pain with right-sided sciatica  Muscle weakness (generalized)  Abnormal posture  Difficulty in walking, not elsewhere classified  Rationale for Evaluation and Treatment Rehabilitation  PERTINENT HISTORY: Asthma, HTN, DMII, has son with spina bifida that is W/C bound. (53 yo),  diabetic neuropathy of feet  PRECAUTIONS: N/A  SUBJECTIVE: "The dry needling helped for a little while. After that session I ended up going for   PAIN:  Are you having pain? Yes: NPRS scale: 4/10 Pain  location: R low back radiates to the knee Pain description: sharp  Aggravating factors: lifting up transferred  Relieving factors: warm shower/ hot pack    OBJECTIVE: (objective measures completed at initial evaluation unless otherwise dated)   DIAGNOSTIC FINDINGS: 10-26-20 FINDINGS: Five lumbar type vertebral bodies. Vertebral body heights and disc spaces are maintained. There is moderate facet arthropathy which is worse on the RIGHT at the L4-5 and L5-S1 levels. No sign of acute abnormality in the lumbar spine. Mild anterior osteophytes throughout the lumbar spine.   IMPRESSION: Degenerative changes in the lumbar spine as described. No acute findings   PATIENT SURVEYS:  ODI  ODI Score = 11 points (22%)   ? Interpretation: 21% to 40% (moderate disability): Patients may experience more pain and problems with sitting, lifting and standing. Travel and social life are more difficult. Patients may be off work. Personal care, sleeping and sexual activity may not be grossly affected. Conservative treatment may be sufficient.   SCREENING FOR RED FLAGS: Bowel or bladder incontinence: No     COGNITION:           Overall cognitive status: Within functional limits for tasks assessed                          SENSATION: diabetic neuropathy of feet   MUSCLE LENGTH: Hamstrings: Right 45 deg; Left 58 deg  POSTURE: rounded shoulders, forward head, decreased lumbar lordosis, and posterior pelvic tilt Pt with Left pelvic level greater than R Posterior pelvic tilt   PALPATION: Painful TTP with R and L Quadratus lumborum   LUMBAR ROM:    Active  A/PROM  eval  Flexion Fingertips to shins, pain in center low back  Extension 50%   Right lateral flexion Fingertip to 1.5 knee jt line   Left lateral flexion Fingertip to 1 inch above knee jt line  Right rotation Pain on R side 50%  Left rotation No pain 75%   (Blank rows = not tested)   LOWER EXTREMITY ROM:      Active   Right eval Left eval  Hip flexion 90 with pain 90  Hip extension      Hip abduction      Hip adduction      Hip internal rotation 20 18  Hip external rotation 40 45  Knee flexion      Knee extension      Ankle dorsiflexion      Ankle plantarflexion      Ankle inversion      Ankle eversion       (Blank rows = not tested)   LOWER EXTREMITY MMT:     MMT Right eval Left eval  Hip flexion 3- 3-  Hip extension 2+ 3-  Hip abduction 3- 4-  Hip adduction      Hip internal rotation      Hip external rotation      Knee flexion 4+ 4+  Knee extension 4 4+  Ankle dorsiflexion 4 4+  Ankle plantarflexion      Ankle inversion      Ankle eversion       (Blank rows = not tested)   LUMBAR SPECIAL TESTS:  Straight leg raise test: Positive, Slump test: Negative, and FABER test: Positive on R   FUNCTIONAL TESTS:  5 times sit to stand: 14.73 sec 6 minute walk test: TBD   GAIT: Distance walked: 150 Assistive device utilized: None Level of assistance: Complete Independence Comments: Pt with decrease stride length and antalgic gait on R     TODAY'S TREATMENT   [facility] Adult PT Treatment:                                                DATE: 08/25/2021 Therapeutic Exercise: Reverse clamshell 2 x 12 RLE only in L sidelying Sidelying hip abduction 2 x 10  RLE only LTR 1 x 20 Nu-step L5 x 5 min UE/LE Manual Therapy: IASTM along the R piriformis  Tack and stretch of the R piriformis Sciatic nerve glides with hamstring stretch and ankle pumps.  Trigger Point Dry-Needling  Treatment instructions: Expect mild to moderate muscle soreness. S/S of pneumothorax if dry needled over a lung field, and to seek immediate medical attention should they occur. Patient verbalized understanding of these instructions and education.  Patient Consent Given: Yes Education handout provided: Previously provided Muscles treated: R piriformis Electrical stimulation performed: Yes Parameters:  CPS L20 x 10  min adjusting PRN Treatment response/outcome: twitch response noted    08-03-21 Eval and issue of HEP as well as TPDN Manual-  STW R QL, R piriformis and R Gluteals  Trigger Point Dry Needling Treatment: Pre-treatment instruction: Patient instructed on dry needling rationale, procedures, and possible side effects including  pain during treatment (achy,cramping feeling), bruising, drop of blood, lightheadedness, nausea, sweating. Patient Consent Given: Yes Education handout provided: Yes Muscles treated: R QL and R piriformis and R Gluteals  Needle size and number: .30x54mm x 2 Electrical stimulation performed: No Parameters: N/A Treatment response/outcome: Twitch response elicited and Palpable decrease in muscle tension Post-treatment instructions: Patient instructed to expect possible mild to moderate muscle soreness later today and/or tomorrow. Patient instructed in methods to reduce muscle soreness and to continue prescribed HEP. If patient was dry needled over the lung field, patient was instructed on signs and symptoms of pneumothorax and, however unlikely, to see immediate medical attention should they occur. Patient was also educated on signs and symptoms of infection and to seek medical attention should they occur. Patient verbalized understanding of these instructions and education.      PATIENT EDUCATION:  Education details: POC Explanation of findings, TPDN education, Issue HEP Person educated: Patient Education method: Explanation, Demonstration, Tactile cues, Verbal cues, and Handouts Education comprehension: verbalized understanding, returned demonstration, verbal cues required, tactile cues required, and needs further education     HOME EXERCISE PROGRAM: Access Code: [email or web address] URL: [email or web address] Date: 08/25/2021 Prepared by: Lulu Riding  Exercises - Hooklying Single Knee to Chest Stretch  - 3 x daily - 7 x weekly - 1 sets - 5 reps - 10 hold -  Child's Pose Stretch  - 3 x daily - 7 x weekly - 1 sets - 10 reps - 5 hold - Seated Lumbar Flexion Stretch  - 3 x daily - 7 x weekly - 1 sets - 5 reps - 5-10 hold - Supine Pelvic Tilt  - 1 x daily - 7 x weekly - 3 sets - 10 reps - Supine Lower Trunk Rotation  - 2 x daily - 7 x weekly - 1 sets - 5 reps - 20 hold - Supine Sciatic Nerve Glide  - 1 x daily - 7 x weekly - 2 sets - 10 reps  Patient Education - Low Back Pain   ASSESSMENT:   CLINICAL IMPRESSION:  Pt arrives to sessio noting she has been consistent with her HEP and notes it has been helping. Continued TPDN for  combined with E-stim followed with IASTM techniques.  Continued working piriformis stretching and glute med activation.  She did well with strengthening for all exercises prescribed and reported minimal to no pain end of session with walking/ standing.      OBJECTIVE IMPAIRMENTS decreased activity tolerance, decreased mobility, difficulty walking, decreased ROM, decreased strength, impaired flexibility, improper body mechanics, postural dysfunction, obesity, and pain.    ACTIVITY LIMITATIONS cleaning, laundry, shopping, and lifting disabled son .    PERSONAL FACTORS Asthma, HTN, DMII, has son with spina bifida that is W/C bound. (53 yo),  diabetic neuropathy of feet are also affecting patient's functional outcome.      REHAB POTENTIAL: Good   CLINICAL DECISION MAKING: Evolving/moderate complexity   EVALUATION COMPLEXITY: Moderate     GOALS: Goals reviewed with patient? Yes   SHORT TERM GOALS: Target date: 08-24-21   Independent with initial HEP Baseline:no knowledge Goal status: INITIAL   2.  Report pain decrease from 9/10 to 5/10 with functional activities at home Baseline: at worst 9/10 and standing for greater than 15 minutes Goal status: INITIAL   3.  Pt will be educated on better lifting techniques for adult son W/C bound with spina bifida to prevent injury Baseline: Need re instruction and demo for  safety Goal status: INITIAL  4.  Demonstrate understanding of neutral posture and be more conscious of position and posture throughout the day.  Baseline: Pt with flat back and sacral sits Goal status: INITIAL       LONG TERM GOALS: Target date: 09-14-21   Demonstrate and verbalize techniques to reduce the risk of re-injury including: lifting, posture, body mechanics.  Baseline: limited knowledge Goal status: INITIAL   2.  Pt will be independent with advanced HEP Baseline: no knowledge Goal status: INITIAL   3.  Pt will be able to complete household chores with pain  4/10 or less Baseline: after 15 min of washing dishes can be 9/10 pain Goal status: INITIAL   4.  Pt will be able to implement HEP including walking program to be able to walk for 1 mile Baseline: Not walking presently for exercise Goal status: INITIAL   5.  Pt will be able to pick up 20 # groceries from floor without exacerbating pain greater than 3/10 Baseline:  Goal status: INITIAL   6.  ODI will improve to  less than 10% Baseline: EVAL 22% Goal status: INITIAL     PLAN: PT FREQUENCY: 2x/week   PT DURATION: 6 weeks   PLANNED INTERVENTIONS: Therapeutic exercises, Therapeutic activity, Neuromuscular re-education, Balance training, Gait training, Patient/Family education, Joint mobilization, Dry Needling, Electrical stimulation, Spinal mobilization, Cryotherapy, Moist heat, Taping, Traction, Manual therapy, and Re-evaluation.   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   Marikay Roads PT, DPT, LAT, ATC  08/25/21  3:03 PM

## 2021-08-27 ENCOUNTER — Ambulatory Visit: Payer: Medicaid Other | Admitting: Physical Therapy

## 2021-08-30 ENCOUNTER — Ambulatory Visit
Admission: RE | Admit: 2021-08-30 | Discharge: 2021-08-30 | Disposition: A | Discharge status: Home / Self Care | Payer: Medicaid Other | Source: Ambulatory Visit | Attending: Obstetrics | Admitting: Obstetrics

## 2021-08-30 DIAGNOSIS — N950 Postmenopausal bleeding: Secondary | ICD-10-CM | POA: Insufficient documentation

## 2021-08-31 ENCOUNTER — Ambulatory Visit: Payer: Medicaid Other | Admitting: Physical Therapy

## 2021-09-03 ENCOUNTER — Ambulatory Visit: Payer: Medicaid Other | Admitting: Physical Therapy

## 2021-09-06 ENCOUNTER — Ambulatory Visit: Payer: Medicaid Other | Admitting: Physical Therapy

## 2021-09-06 ENCOUNTER — Encounter: Payer: Self-pay | Admitting: Physical Therapy

## 2021-09-06 DIAGNOSIS — R262 Difficulty in walking, not elsewhere classified: Secondary | ICD-10-CM

## 2021-09-06 DIAGNOSIS — M5441 Lumbago with sciatica, right side: Secondary | ICD-10-CM | POA: Diagnosis not present

## 2021-09-06 DIAGNOSIS — M6281 Muscle weakness (generalized): Secondary | ICD-10-CM

## 2021-09-06 DIAGNOSIS — G8929 Other chronic pain: Secondary | ICD-10-CM

## 2021-09-06 DIAGNOSIS — R293 Abnormal posture: Secondary | ICD-10-CM

## 2021-09-21 ENCOUNTER — Encounter: Payer: Medicaid Other | Admitting: Physical Therapy

## 2021-09-28 ENCOUNTER — Ambulatory Visit: Payer: Medicaid Other | Admitting: Physical Therapy

## 2021-09-28 ENCOUNTER — Telehealth: Payer: Self-pay | Admitting: Physical Therapy

## 2021-09-28 NOTE — Telephone Encounter (Signed)
Spoke with pt regarding missed appointment today. She stated she had signed into mychart and cancelled it through there. She mentioned she was not able to make it in due to an issue with her son and also she doesn't do well with the heat.   In regard to cancelling via mychart I referenced to call the clinic and leave a message if she has to cancel the next visit, just to double check it had gone through.  Allen Basista PT, DPT, LAT, ATC  09/28/21  2:08 PM

## 2021-10-05 ENCOUNTER — Ambulatory Visit: Payer: Medicaid Other | Attending: Family Medicine | Admitting: Physical Therapy

## 2021-10-05 ENCOUNTER — Encounter: Payer: Self-pay | Admitting: Physical Therapy

## 2021-10-05 DIAGNOSIS — G8929 Other chronic pain: Secondary | ICD-10-CM

## 2021-10-05 DIAGNOSIS — R262 Difficulty in walking, not elsewhere classified: Secondary | ICD-10-CM | POA: Diagnosis present

## 2021-10-05 DIAGNOSIS — M5441 Lumbago with sciatica, right side: Secondary | ICD-10-CM | POA: Diagnosis not present

## 2021-10-05 DIAGNOSIS — R293 Abnormal posture: Secondary | ICD-10-CM | POA: Diagnosis present

## 2021-10-05 DIAGNOSIS — M6281 Muscle weakness (generalized): Secondary | ICD-10-CM | POA: Diagnosis present

## 2021-10-05 NOTE — Therapy (Signed)
OUTPATIENT PHYSICAL THERAPY TREATMENT NOTE / discharge   Patient Name: Deborah Mcclain MRN: [id number] DOB:09-28-1970, 51 y.o., female Today's Date: 10/05/2021  PCP: Vassie Moment MD REFERRING PROVIDER: Drue Flirt, MD  END OF SESSION:   PT End of Session - 10/05/21 1326     Visit Number 4    Number of Visits 13    Date for PT Re-Evaluation 10/05/21    Authorization Type [employer]    Authorization - Visit Number 3    Authorization - Number of Visits 12    PT Start Time 1326    PT Stop Time 1404    PT Time Calculation (min) 38 min               Past Medical History:  Diagnosis Date   Asthma    Eczema    Hypertension    Type 2 diabetes mellitus ([place]) 12/2018   Past Surgical History:  Procedure Laterality Date   CESAREAN SECTION  05/25/1997   CESAREAN SECTION  03/30/2005   twins   ENDOMETRIAL BIOPSY  12/03/2019   Patient Active Problem List   Diagnosis Date Noted   Pain due to onychomycosis of toenails of both feet 12/02/2020   Hyperglycemia due to diabetes mellitus ([place]) 01/09/2020   Anemia 12/03/2019   Abnormal uterine bleeding (AUB) 12/03/2019   History of blood transfusion 12/03/2019   Essential hypertension 04/16/2019   Diabetes mellitus type 2 in obese ([place]) 04/16/2019   Morbid obesity with BMI of 50.0-59.9, adult ([place]) 04/16/2019   Menopausal vasomotor syndrome 04/16/2019    REFERRING DIAG: Sciatica   THERAPY DIAG:  Chronic right-sided low back pain with right-sided sciatica  Muscle weakness (generalized)  Abnormal posture  Difficulty in walking, not elsewhere classified  Rationale for Evaluation and Treatment Rehabilitation  PERTINENT HISTORY: Asthma, HTN, DMII, has son with spina bifida that is W/C bound. (53 yo),  diabetic neuropathy of feet  PRECAUTIONS: N/A  SUBJECTIVE: " Pain has been okay, I've been okay. I've been busy with my son and helping him with his needs at home."  PAIN:  Are you having pain? Yes: NPRS scale:  6/10 Pain location: L shoulder Pain description:   Aggravating factors: lifting up transferred  Relieving factors: warm shower/ hot pack    OBJECTIVE: (objective measures completed at initial evaluation unless otherwise dated)  DIAGNOSTIC FINDINGS: 10-26-20 FINDINGS: Five lumbar type vertebral bodies. Vertebral body heights and disc spaces are maintained. There is moderate facet arthropathy which is worse on the RIGHT at the L4-5 and L5-S1 levels. No sign of acute abnormality in the lumbar spine. Mild anterior osteophytes throughout the lumbar spine.   IMPRESSION: Degenerative changes in the lumbar spine as described. No acute findings   PATIENT SURVEYS:  ODI  ODI Score = 11 points (22%) 10/05/2021 - 16%   ? Interpretation: 21% to 40% (moderate disability): Patients may experience more pain and problems with sitting, lifting and standing. Travel and social life are more difficult. Patients may be off work. Personal care, sleeping and sexual activity may not be grossly affected. Conservative treatment may be sufficient.   SCREENING FOR RED FLAGS: Bowel or bladder incontinence: No     COGNITION:           Overall cognitive status: Within functional limits for tasks assessed                          SENSATION: diabetic neuropathy of feet   MUSCLE LENGTH:  Hamstrings: Right 45 deg; Left 58 deg     POSTURE: rounded shoulders, forward head, decreased lumbar lordosis, and posterior pelvic tilt Pt with Left pelvic level greater than R Posterior pelvic tilt   PALPATION: Painful TTP with R and L Quadratus lumborum   LUMBAR ROM:    Active  A/PROM  eval  Flexion Fingertips to shins, pain in center low back  Extension 50%   Right lateral flexion Fingertip to 1.5 knee jt line   Left lateral flexion Fingertip to 1 inch above knee jt line  Right rotation Pain on R side 50%  Left rotation No pain 75%   (Blank rows = not tested)   LOWER EXTREMITY ROM:      Active   Right eval Left eval  Hip flexion 90 with pain 90  Hip extension      Hip abduction      Hip adduction      Hip internal rotation 20 18  Hip external rotation 40 45  Knee flexion      Knee extension      Ankle dorsiflexion      Ankle plantarflexion      Ankle inversion      Ankle eversion       (Blank rows = not tested)   LOWER EXTREMITY MMT:     MMT Right eval Left eval  Hip flexion 3- 3-  Hip extension 2+ 3-  Hip abduction 3- 4-  Hip adduction      Hip internal rotation      Hip external rotation      Knee flexion 4+ 4+  Knee extension 4 4+  Ankle dorsiflexion 4 4+  Ankle plantarflexion      Ankle inversion      Ankle eversion       (Blank rows = not tested)   LUMBAR SPECIAL TESTS:  Straight leg raise test: Positive, Slump test: Negative, and FABER test: Positive on R   FUNCTIONAL TESTS:  5 times sit to stand: 14.73 sec 6 minute walk test: TBD   GAIT: Distance walked: 150 Assistive device utilized: None Level of assistance: Complete Independence Comments: Pt with decrease stride length and antalgic gait on R     TODAY'S TREATMENT  [facility] Adult PT Treatment:                                                DATE: 09/28/2021 Therapeutic Exercise: Extensively reviewed and updated HEP today with how to progress with updated reps/ sets and resistance.  Manual Therapy: Manual trigger point release using theracane Therapeutic Activity: How to perform log rolling for a patient controlling hip/ knee to help prevent back pain.  Self Care: Reviewed posture and lifting mechanics   [facility] Adult PT Treatment:                                                DATE: 09/06/2021 Therapeutic Exercise: Nu-step L6 x 5 min UE/LE Seated piriformis stretch 2 x 30 sec R only Sit to stand 2 x 10, 2nd set with nose over toes rocking forward. Standing hip abduction 2 x 10 with hands on chair.  Self Care: Posture education and lifting mechanics to reduce stress/ strain  on the low back  with handout  [facility] Adult PT Treatment:                                                DATE: 08/25/2021 Therapeutic Exercise: Reverse clamshell 2 x 12 RLE only in L sidelying Sidelying hip abduction 2 x 10  RLE only LTR 1 x 20 Nu-step L5 x 5 min UE/LE Manual Therapy: IASTM along the R piriformis  Tack and stretch of the R piriformis Sciatic nerve glides with hamstring stretch and ankle pumps.  Trigger Point Dry-Needling  Treatment instructions: Expect mild to moderate muscle soreness. S/S of pneumothorax if dry needled over a lung field, and to seek immediate medical attention should they occur. Patient verbalized understanding of these instructions and education.  Patient Consent Given: Yes Education handout provided: Previously provided Muscles treated: R piriformis Electrical stimulation performed: Yes Parameters:  CPS L20 x 10 min adjusting PRN Treatment response/outcome: twitch response noted     PATIENT EDUCATION:  Education details: POC Explanation of findings, TPDN education, Issue HEP Person educated: Patient Education method: Explanation, Demonstration, Tactile cues, Verbal cues, and Handouts Education comprehension: verbalized understanding, returned demonstration, verbal cues required, tactile cues required, and needs further education     HOME EXERCISE PROGRAM: Access Code: [email or web address] URL: [email or web address] Date: 10/05/2021 Prepared by: Starr Lake  Exercises - Hooklying Single Knee to Chest Stretch  - 3 x daily - 7 x weekly - 1 sets - 3 reps - 20 hold - Child's Pose Stretch  - 3 x daily - 7 x weekly - 1 sets - 5 reps - 20 hold - Seated Lumbar Flexion Stretch  - 3 x daily - 7 x weekly - 1 sets - 5 reps - 20 hold - Supine Pelvic Tilt  - 1 x daily - 7 x weekly - 3 sets - 10 reps - Seated Pelvic Tilt  - 1 x daily - 7 x weekly - 2 sets - 10 reps - 5 seconds hold - Supine Lower Trunk Rotation  - 2 x daily - 7 x weekly - 1 sets - 5 reps - 20  hold - Supine Sciatic Nerve Glide  - 1 x daily - 7 x weekly - 2 sets - 10 reps - Standing Hip Abduction with Counter Support  - 1 x daily - 7 x weekly - 2 sets - 10 reps - Standing Hip Extension with Resistance at Ankles and Counter Support  - 1 x daily - 7 x weekly - 2 sets - 10 reps - Sit to Stand Without Arm Support  - 1 x daily - 7 x weekly - 2 sets - 10 reps  Patient Education - Low Back Pain - Posture and Body Mechanics   ASSESSMENT:   CLINICAL IMPRESSION: Mrs Dolce  has made great progress with physical therapy increasing mobility, walking endurance and additionally reports minimal to no pain. Time was taken to review her HEP as well as posture with log rolling her son since she takes care of him at home. She has met or partially met all goals today and is able to maintain and progress her current LOF IND and will be discharged from PT today .     OBJECTIVE IMPAIRMENTS decreased activity tolerance, decreased mobility, difficulty walking, decreased ROM, decreased strength, impaired flexibility, improper body mechanics, postural dysfunction, obesity, and pain.  ACTIVITY LIMITATIONS cleaning, laundry, shopping, and lifting disabled son .    PERSONAL FACTORS Asthma, HTN, DMII, has son with spina bifida that is W/C bound. (53 yo),  diabetic neuropathy of feet are also affecting patient's functional outcome.      REHAB POTENTIAL: Good   CLINICAL DECISION MAKING: Evolving/moderate complexity   EVALUATION COMPLEXITY: Moderate     GOALS: Goals reviewed with patient? Yes   SHORT TERM GOALS: Target date: 08-24-21   Independent with initial HEP Baseline:no knowledge Goal status: MET 09/06/2021   2.  Report pain decrease from 9/10 to 5/10 with functional activities at home Baseline: at worst 9/10 and standing for greater than 15 minutes Goal status: MET 09/06/2021   3.  Pt will be educated on better lifting techniques for adult son W/C bound with spina bifida to prevent  injury Baseline: Need re instruction and demo for safety Goal status: MET 09/06/2021   4.  Demonstrate understanding of neutral posture and be more conscious of position and posture throughout the day.  Baseline: Pt with flat back and sacral sits Goal status: MET 09/06/2021       LONG TERM GOALS: Target date: 09-14-21   Demonstrate and verbalize techniques to reduce the risk of re-injury including: lifting, posture, body mechanics.  Baseline: limited knowledge Goal status: PARTIALLY 10/05/2021   2.  Pt will be independent with advanced HEP Baseline: no knowledge Goal status: INITIAL   3.  Pt will be able to complete household chores with pain  4/10 or less Baseline: after 15 min of washing dishes can be 9/10 pain Goal status: MET 10/05/2021   4.  Pt will be able to implement HEP including walking program to be able to walk for 1 mile Baseline: Not walking presently for exercise Goal status: MET 10/05/2021    5.  Pt will be able to pick up 20 # groceries from floor without exacerbating pain greater than 3/10 Baseline:  Goal status: MET 10/05/2021   6.  ODI will improve to  less than 10% Baseline: EVAL 22% Goal status: PARTIALLY 10/05/2021     PLAN: PT FREQUENCY: 2x/week   PT DURATION: 6 weeks   PLANNED INTERVENTIONS: Therapeutic exercises, Therapeutic activity, Neuromuscular re-education, Balance training, Gait training, Patient/Family education, Joint mobilization, Dry Needling, Electrical stimulation, Spinal mobilization, Cryotherapy, Moist heat, Taping, Traction, Manual therapy, and Re-evaluation.   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   Liam Bossman PT, DPT, LAT, ATC  10/05/21  2:10 PM     PHYSICAL THERAPY DISCHARGE SUMMARY  Visits from Start of Care: 4  Current functional level related to goals / functional  outcomes: See goals, ODI 16% limitation   Remaining deficits: See assessment   Education / Equipment: HEP, posture, lifting mechanics, theraband   Patient agrees to discharge. Patient goals were partially met. Patient is being discharged due to meeting the stated rehab goals.

## 2021-10-11 ENCOUNTER — Ambulatory Visit: Payer: Medicaid Other | Admitting: Physical Therapy

## 2021-10-21 DIAGNOSIS — J309 Allergic rhinitis, unspecified: Secondary | ICD-10-CM | POA: Insufficient documentation

## 2021-10-21 DIAGNOSIS — L83 Acanthosis nigricans: Secondary | ICD-10-CM | POA: Insufficient documentation

## 2021-10-21 HISTORY — DX: Acanthosis nigricans: L83

## 2021-10-21 HISTORY — DX: Allergic rhinitis, unspecified: J30.9

## 2021-10-27 ENCOUNTER — Encounter: Payer: Self-pay | Admitting: Podiatry

## 2021-10-27 ENCOUNTER — Ambulatory Visit (INDEPENDENT_AMBULATORY_CARE_PROVIDER_SITE_OTHER): Payer: Medicaid Other | Admitting: Podiatry

## 2021-10-27 DIAGNOSIS — E669 Obesity, unspecified: Secondary | ICD-10-CM

## 2021-10-27 DIAGNOSIS — B351 Tinea unguium: Secondary | ICD-10-CM | POA: Diagnosis not present

## 2021-10-27 DIAGNOSIS — M79674 Pain in right toe(s): Secondary | ICD-10-CM

## 2021-10-27 DIAGNOSIS — M79675 Pain in left toe(s): Secondary | ICD-10-CM | POA: Diagnosis not present

## 2021-10-27 DIAGNOSIS — E1169 Type 2 diabetes mellitus with other specified complication: Secondary | ICD-10-CM

## 2021-10-27 NOTE — Progress Notes (Signed)
  Subjective:  Patient ID: Deborah Mcclain, female    DOB: 11-21-1970,   MRN: [id number]  No chief complaint on file.   51 y.o. female presents for concern of thickened elongated and painful nails that are difficult to trim. Requesting to have them trimmed today. Relates burning and tingling in their feet. Patient is diabetic and last A1c was  Lab Results  Component Value Date   HGBA1C 14.0 (H) 01/09/2020   .   PCP:  Laruth Bouchard, MD    . Denies any other pedal complaints. Denies n/v/f/c.   Past Medical History:  Diagnosis Date   Asthma    Eczema    Hypertension    Type 2 diabetes mellitus (HCC) 12/2018    Objective:  Physical Exam: Vascular: DP/PT pulses 2/4 bilateral. CFT <3 seconds. Absent hair growth on digits. Edema noted to bilateral lower extremities. Xerosis noted bilaterally.  Skin. No lacerations or abrasions bilateral feet. Nails 1-5 bilateral  are thickened discolored and elongated with subungual debris.  Musculoskeletal: MMT 5/5 bilateral lower extremities in DF, PF, Inversion and Eversion. Deceased ROM in DF of ankle joint.  Neurological: Sensation intact to light touch. Protective sensation diminished bilateral.    Assessment:   1. Pain due to onychomycosis of toenails of both feet   2. Diabetes mellitus type 2 in obese [facility]      Plan:  Patient was evaluated and treated and all questions answered. -Discussed and educated patient on diabetic foot care, especially with  regards to the vascular, neurological and musculoskeletal systems.  -Stressed the importance of good glycemic control and the detriment of not  controlling glucose levels in relation to the foot. -Discussed supportive shoes at all times and checking feet regularly.  -Mechanically debrided all nails 1-5 bilateral using sterile nail nipper and filed with dremel without incident  -Answered all patient questions -Patient to return  in 3 months for at risk foot care -Patient advised to call the  office if any problems or questions arise in the meantime.   Louann Sjogren, DPM

## 2021-12-01 ENCOUNTER — Ambulatory Visit: Payer: Self-pay

## 2021-12-02 ENCOUNTER — Ambulatory Visit: Payer: Self-pay

## 2021-12-03 ENCOUNTER — Ambulatory Visit
Admission: RE | Admit: 2021-12-03 | Discharge: 2021-12-03 | Disposition: A | Discharge status: Home / Self Care | Payer: Medicaid Other | Source: Ambulatory Visit | Attending: Internal Medicine | Admitting: Internal Medicine

## 2021-12-03 VITALS — BP 149/97 | HR 90 | Temp 97.8°F | Resp 18

## 2021-12-03 DIAGNOSIS — E119 Type 2 diabetes mellitus without complications: Secondary | ICD-10-CM | POA: Diagnosis not present

## 2021-12-03 DIAGNOSIS — J069 Acute upper respiratory infection, unspecified: Secondary | ICD-10-CM

## 2021-12-03 DIAGNOSIS — Z794 Long term (current) use of insulin: Secondary | ICD-10-CM

## 2021-12-03 MED ORDER — LANTUS SOLOSTAR 100 UNIT/ML ~~LOC~~ SOPN: PEN_INJECTOR | SUBCUTANEOUS | 0 refills | Status: AC

## 2021-12-03 MED ORDER — FLUTICASONE PROPIONATE 50 MCG/ACT NA SUSP: 1.0000 | Freq: Every day | NASAL | 0 refills | Status: AC

## 2021-12-03 MED ORDER — BUDESONIDE-FORMOTEROL FUMARATE 160-4.5 MCG/ACT IN AERO
2.0000 | INHALATION_SPRAY | Freq: Two times a day (BID) | RESPIRATORY_TRACT | 12 refills | Status: DC
Start: 2021-12-03 — End: 2022-05-19

## 2021-12-03 MED ORDER — BENZONATATE 100 MG PO CAPS: 100.0000 mg | ORAL_CAPSULE | Freq: Three times a day (TID) | ORAL | 0 refills | Status: DC | PRN

## 2021-12-03 NOTE — Discharge Instructions (Addendum)
It appears that you have a viral upper respiratory infection symptoms that should run its course and self resolve with symptomatic treatment.  I prescribed you medications to alleviate symptoms.  I have prescribed Symbicort inhaler to alleviate chest tightness.  I have also refilled your insulin.  Please follow-up with primary care doctor for any further refills.

## 2021-12-03 NOTE — ED Triage Notes (Signed)
Pt is present today with c/o cough, HA, right ear pain. Pt sx started one weeks ago

## 2021-12-03 NOTE — ED Provider Notes (Signed)
[name]    [name]: [id number] Arrival date & time: 12/03/21  0932      History   Chief Complaint Chief Complaint  Patient presents with   Headache    And earache as well as dizziness and nausea - Entered by patient   Otalgia   Cough    HPI Adalaya Byrnes is a 51 y.o. female.   Patient presents with headache, nasal congestion, cough, right ear pain that has been present for about a week.  Denies chest pain or shortness of breath but does endorse some chest tightness that started a few days prior.  Patient has history of asthma and has been using albuterol inhaler with temporary improvement in symptoms.  Denies any known fevers or sick contacts.  Denies sore throat, nausea, vomiting, diarrhea, abdominal pain.  Patient is also requesting refill on Lantus.  Patient reports that she went to the pharmacy and they told her that refills had "expired".  She has not contacted PCP for any further refills.  States that she is completely out of Lantus.   Headache Otalgia Cough   Past Medical History:  Diagnosis Date   Asthma    Eczema    Hypertension    Type 2 diabetes mellitus ([place]) 12/2018    Patient Active Problem List   Diagnosis Date Noted   Pain due to onychomycosis of toenails of both feet 12/02/2020   Hyperglycemia due to diabetes mellitus ([place]) 01/09/2020   Anemia 12/03/2019   Abnormal uterine bleeding (AUB) 12/03/2019   History of blood transfusion 12/03/2019   Essential hypertension 04/16/2019   Diabetes mellitus type 2 in obese ([place]) 04/16/2019   Morbid obesity with BMI of 50.0-59.9, adult ([place]) 04/16/2019   Menopausal vasomotor syndrome 04/16/2019    Past Surgical History:  Procedure Laterality Date   CESAREAN SECTION  05/25/1997   CESAREAN SECTION  03/30/2005   twins   ENDOMETRIAL BIOPSY  12/03/2019    OB History     Gravida  2   Para  2   Term  1   Preterm  1   AB      Living  3      SAB      IAB      Ectopic       Multiple  1   Live Births  3            Home Medications    Prior to Admission medications   Medication Sig Start Date End Date Taking? Authorizing Provider  benzonatate (TESSALON) 100 MG capsule Take 1 capsule (100 mg total) by mouth every 8 (eight) hours as needed for cough. 12/03/21  Yes Kriya Westra, Michele Rockers, FNP  budesonide-formoterol (SYMBICORT) 160-4.5 MCG/ACT inhaler Inhale 2 puffs into the lungs in the morning and at bedtime. 12/03/21  Yes Dorance Spink, Michele Rockers, FNP  fluticasone (FLONASE) 50 MCG/ACT nasal spray Place 1 spray into both nostrils daily for 3 days. 12/03/21 12/06/21 Yes Cleta Heatley, Michele Rockers, FNP  albuterol (VENTOLIN HFA) 108 (90 Base) MCG/ACT inhaler INHALE 1 TO 2 PUFFS BY MOUTH EVERY 4 HOURS AS NEEDED 07/01/19   [provider]  amLODipine (NORVASC) 10 MG tablet Take 10 mg by mouth daily. 07/12/21   [provider]  amoxicillin (AMOXIL) 875 MG tablet Take 1 tablet (875 mg total) by mouth 2 (two) times daily. 10/26/20   Jaynee Eagles, PA-C  atorvastatin (LIPITOR) 40 MG tablet Take 1 tablet (40 mg total) by mouth at bedtime. 01/12/20   Ghimire,  Henreitta Leber, MD  cetirizine (ZYRTEC) 10 MG tablet Take 1 tablet (10 mg total) by mouth daily. 01/16/18   Loura Halt A, NP  cyclobenzaprine (FLEXERIL) 10 MG tablet Take 1 tablet (10 mg total) by mouth 2 (two) times daily as needed for muscle spasms. 06/13/21   Francene Finders, PA-C  ferrous sulfate 325 (65 FE) MG tablet Take 1 tablet (325 mg total) by mouth daily. 11/29/19   Isla Pence, MD  fluconazole (DIFLUCAN) 200 MG tablet Take 1 tablet (200 mg total) by mouth every 3 (three) days. 08/17/21   Shelly Bombard, MD  gabapentin (NEURONTIN) 300 MG capsule Take 1 capsule (300 mg total) by mouth 3 (three) times daily. 10/26/20   Jaynee Eagles, PA-C  hydrOXYzine (ATARAX/VISTARIL) 25 MG tablet Take 1 tablet (25 mg total) by mouth every 6 (six) hours as needed for itching. 07/24/20   Wieters, Hallie C, PA-C  insulin aspart (NOVOLOG) 100 UNIT/ML  FlexPen 0-20 Units, Subcutaneous, 3 times daily with meals CBG < 70: Implement Hypoglycemia measures, call MD CBG 70 - 120: 0 units CBG 121 - 150: 3 units CBG 151 - 200: 4 units CBG 201 - 250: 7 units CBG 251 - 300: 11 units CBG 301 - 350: 15 units CBG 351 - 400: 20 units CBG > 400: call MD 06/30/20   Scot Jun, FNP  Insulin Pen Needle 32G X 4 MM MISC 1 each by Does not apply route as needed. 01/12/20   Ghimire, Henreitta Leber, MD  ketorolac (TORADOL) 10 MG tablet Take 1 tablet (10 mg total) by mouth every 6 (six) hours as needed. 06/30/20   Scot Jun, FNP  LANTUS SOLOSTAR 100 UNIT/ML Solostar Pen 46 units in am and 36 units in pm 12/03/21   Teodora Medici, FNP  megestrol (MEGACE) 40 MG tablet Take 1 tablet (40 mg total) by mouth daily. Can increase to two tablets twice a day in the event of heavy bleeding 03/27/20   Chancy Milroy, MD  metFORMIN (GLUCOPHAGE) 1000 MG tablet Take 1,000 mg by mouth 2 (two) times daily with a meal.     [provider]  metoprolol tartrate (LOPRESSOR) 50 MG tablet Take 1 tablet (50 mg total) by mouth 2 (two) times daily. 01/12/20   Ghimire, Henreitta Leber, MD  nystatin (MYCOSTATIN/NYSTOP) powder Apply 1 application topically 3 (three) times daily. 07/24/20   Wieters, Hallie C, PA-C  triamcinolone cream (KENALOG) 0.1 % Apply 1 application topically 2 (two) times daily. Patient not taking: Reported on 08/17/2021 07/24/20   Wieters, Hallie C, PA-C  venlafaxine XR (EFFEXOR-XR) 75 MG 24 hr capsule Take 1 capsule (75 mg total) by mouth daily. Patient not taking: Reported on 08/17/2021 12/26/19   Leftwich-Kirby, Kathie Dike, CNM    Family History Family History  Problem Relation Age of Onset   Breast cancer Mother    Diabetes Mother    Hypertension Mother    Diabetes Maternal Grandmother     Social History Social History   Tobacco Use   Smoking status: Never   Smokeless tobacco: Never  Vaping Use   Vaping Use: Never used  Substance Use Topics   Alcohol use:  Yes    Comment: occ   Drug use: Never     Allergies   Other   Review of Systems Review of Systems Per HPI  Physical Exam Triage Vital Signs ED Triage Vitals  Enc Vitals Group     BP 12/03/21 1000 (!) 149/97  Pulse Rate 12/03/21 1000 (!) 103     Resp 12/03/21 1000 18     Temp 12/03/21 1000 97.8 F (36.6 C)     Temp src --      SpO2 12/03/21 1000 94 %     Weight --      Height --      Head Circumference --      Peak Flow --      Pain Score 12/03/21 0958 8     Pain Loc --      Pain Edu? --      Excl. in [place]? --    No data found.  Updated Vital Signs BP (!) 149/97   Pulse 90   Temp 97.8 F (36.6 C)   Resp 18   LMP 01/31/2020 (Approximate)   SpO2 94%   Visual Acuity Right Eye Distance:   Left Eye Distance:   Bilateral Distance:    Right Eye Near:   Left Eye Near:    Bilateral Near:     Physical Exam Constitutional:      General: She is not in acute distress.    Appearance: Normal appearance. She is not toxic-appearing or diaphoretic.  HENT:     Head: Normocephalic and atraumatic.     Right Ear: Tympanic membrane and ear canal normal.     Left Ear: Tympanic membrane and ear canal normal.     Nose: Congestion present.     Mouth/Throat:     Mouth: Mucous membranes are moist.     Pharynx: No posterior oropharyngeal erythema.  Eyes:     Extraocular Movements: Extraocular movements intact.     Conjunctiva/sclera: Conjunctivae normal.     Pupils: Pupils are equal, round, and reactive to light.  Cardiovascular:     Rate and Rhythm: Normal rate and regular rhythm.     Pulses: Normal pulses.     Heart sounds: Normal heart sounds.  Pulmonary:     Effort: Pulmonary effort is normal. No respiratory distress.     Breath sounds: Normal breath sounds. No wheezing.  Abdominal:     General: Abdomen is flat. Bowel sounds are normal.     Palpations: Abdomen is soft.  Musculoskeletal:        General: Normal range of motion.     Cervical back: Normal range of  motion.  Skin:    General: Skin is warm and dry.  Neurological:     General: No focal deficit present.     Mental Status: She is alert and oriented to person, place, and time. Mental status is at baseline.  Psychiatric:        Mood and Affect: Mood normal.        Behavior: Behavior normal.      UC Treatments / Results  Labs (all labs ordered are listed, but only abnormal results are displayed) Labs Reviewed - No data to display  EKG   Radiology No results found.  Procedures Procedures (including critical care time)  Medications Ordered in UC Medications - No data to display  Initial Impression / Assessment and Plan / UC Course  I have reviewed the triage vital signs and the nursing notes.  Pertinent labs & imaging results that were available during my care of the patient were reviewed by me and considered in my medical decision making (see chart for details).     Patient presents with symptoms likely from a viral upper respiratory infection. Differential includes bacterial pneumonia, sinusitis, allergic rhinitis, COVID-19, flu,  RSV.  Patient is nontoxic appearing and not in need of emergent medical intervention.  Will defer COVID testing as I do not think it is necessary given duration of symptoms and would not change treatment.  Do not think chest imaging is necessary given no adventitious lung sounds on exam.  Chest tightness is most likely due to possible flareup of asthma.  Do not suspect cardiac etiology.  Patient has been using albuterol inhaler with temporary improvement.  Will avoid prednisone given patient's most recent A1c on 11/25/2021 was 14.1.  Given risks of doing oral prednisone but patient complaining of chest tightness, will trial Symbicort.  Patient to rinse out mouth after using inhaler.  Recommended symptom control with occasions.  Patient sent prescriptions.  Prescriptions.  Patient requesting refill on Lantus insulin as she is completely out.  Will refill  at last known prescribed dose.  Patient advised to follow-up with PCP for any further refills as well.  Return if symptoms fail to improve. Patient states understanding and is agreeable.  Discharged with PCP followup.  Final Clinical Impressions(s) / UC Diagnoses   Final diagnoses:  Viral upper respiratory tract infection with cough  Type 2 diabetes mellitus without complication, with long-term current use of insulin [facility])     Discharge Instructions      It appears that you have a viral upper respiratory infection symptoms that should run its course and self resolve with symptomatic treatment.  I prescribed you medications to alleviate symptoms.  I have prescribed Symbicort inhaler to alleviate chest tightness.  I have also refilled your insulin.  Please follow-up with primary care doctor for any further refills.    ED Prescriptions     Medication Sig Dispense Auth. Provider   budesonide-formoterol (SYMBICORT) 160-4.5 MCG/ACT inhaler Inhale 2 puffs into the lungs in the morning and at bedtime. 1 each Haworth, Hildred Alamin E, FNP   fluticasone [facility]) 50 MCG/ACT nasal spray Place 1 spray into both nostrils daily for 3 days. 16 g Webber Michiels, Hildred Alamin E, [place]   benzonatate (TESSALON) 100 MG capsule Take 1 capsule (100 mg total) by mouth every 8 (eight) hours as needed for cough. 21 capsule [place], Grayson E, [place]   LANTUS SOLOSTAR 100 UNIT/ML Solostar Pen 46 units in am and 36 units in pm 15 mL Teodora Medici, [place]      PDMP not reviewed this encounter.   Teodora Medici,  12/03/21 [phone]

## 2021-12-11 ENCOUNTER — Ambulatory Visit: Payer: Medicaid Other

## 2022-01-21 ENCOUNTER — Other Ambulatory Visit: Payer: Self-pay | Admitting: Physician Assistant

## 2022-01-21 DIAGNOSIS — Z1231 Encounter for screening mammogram for malignant neoplasm of breast: Secondary | ICD-10-CM

## 2022-01-27 ENCOUNTER — Other Ambulatory Visit: Payer: Self-pay | Admitting: Physician Assistant

## 2022-01-27 DIAGNOSIS — Z1231 Encounter for screening mammogram for malignant neoplasm of breast: Secondary | ICD-10-CM

## 2022-02-15 ENCOUNTER — Encounter: Payer: Self-pay | Admitting: Podiatry

## 2022-02-15 ENCOUNTER — Ambulatory Visit (INDEPENDENT_AMBULATORY_CARE_PROVIDER_SITE_OTHER): Payer: Medicaid Other | Admitting: Podiatry

## 2022-02-15 VITALS — BP 160/93

## 2022-02-15 DIAGNOSIS — M79675 Pain in left toe(s): Secondary | ICD-10-CM | POA: Diagnosis not present

## 2022-02-15 DIAGNOSIS — K219 Gastro-esophageal reflux disease without esophagitis: Secondary | ICD-10-CM | POA: Insufficient documentation

## 2022-02-15 DIAGNOSIS — J4520 Mild intermittent asthma, uncomplicated: Secondary | ICD-10-CM | POA: Insufficient documentation

## 2022-02-15 DIAGNOSIS — M79674 Pain in right toe(s): Secondary | ICD-10-CM

## 2022-02-15 DIAGNOSIS — M2142 Flat foot [pes planus] (acquired), left foot: Secondary | ICD-10-CM

## 2022-02-15 DIAGNOSIS — E119 Type 2 diabetes mellitus without complications: Secondary | ICD-10-CM | POA: Diagnosis not present

## 2022-02-15 DIAGNOSIS — E669 Obesity, unspecified: Secondary | ICD-10-CM

## 2022-02-15 DIAGNOSIS — E1142 Type 2 diabetes mellitus with diabetic polyneuropathy: Secondary | ICD-10-CM

## 2022-02-15 DIAGNOSIS — B351 Tinea unguium: Secondary | ICD-10-CM

## 2022-02-15 DIAGNOSIS — M2141 Flat foot [pes planus] (acquired), right foot: Secondary | ICD-10-CM | POA: Diagnosis not present

## 2022-02-15 NOTE — Progress Notes (Signed)
ANNUAL DIABETIC FOOT EXAM Subjective: Deborah Mcclain presents today for annual diabetic foot examination.  Chief Complaint  Patient presents with   Nail Problem    [facility] BS-did not check today A1C-14.0 PCP-Deborah Mcclain PCP VST-1 month ago   Patient confirms h/o diabetes.  Patient relates 3 year h/o diabetes.  Patient denies any h/o foot wounds.  Patient admits some symptoms of foot tingling.  Patient has been diagnosed with neuropathy and it is managed with gabapentin.  Risk factors: diabetes, diabetic neuropathy, HTN, hyperlipidemia.  Deborah Skye, PA-C is patient's PCP.  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   Leucocytosis 09/10/2020   Vitamin D deficiency 09/10/2020   Hyperlipidemia 01/20/2020   Iron deficiency anemia 01/20/2020   Hyperglycemia due to diabetes mellitus (HCC) 01/09/2020   Anemia 12/03/2019   Abnormal uterine bleeding (AUB) 12/03/2019   History of blood transfusion 12/03/2019   Essential hypertension 04/16/2019   Diabetes mellitus type 2 in obese (HCC) 04/16/2019   Morbid obesity with BMI of 50.0-59.9, adult (HCC) 04/16/2019   Menopausal vasomotor syndrome 04/16/2019   Past Surgical History:  Procedure Laterality Date   CESAREAN SECTION  05/25/1997   CESAREAN SECTION  03/30/2005   twins   ENDOMETRIAL BIOPSY  12/03/2019   Current Outpatient Medications on File Prior to Visit  Medication Sig Dispense Refill   albuterol (VENTOLIN HFA) 108 (90 Base) MCG/ACT inhaler INHALE 1 TO 2 PUFFS BY MOUTH EVERY 4 HOURS AS NEEDED     amLODipine (NORVASC)  10 MG tablet Take 10 mg by mouth daily.     amoxicillin (AMOXIL) 875 MG tablet Take 1 tablet (875 mg total) by mouth 2 (two) times daily. 14 tablet 0   atorvastatin (LIPITOR) 40 MG tablet Take 1 tablet (40 mg total) by mouth at bedtime. 30 tablet 0   benzonatate (TESSALON) 100 MG capsule Take 1 capsule (100 mg total) by mouth every 8 (eight) hours as needed for cough. 21 capsule 0   budesonide-formoterol (SYMBICORT) 160-4.5 MCG/ACT inhaler Inhale 2 puffs into the lungs in the morning and at bedtime. 1 each 12   cetirizine (ZYRTEC) 10 MG tablet Take 1 tablet (10 mg total) by mouth daily. 30 tablet 0   cyclobenzaprine (FLEXERIL) 10 MG tablet Take 1 tablet (10 mg total) by mouth 2 (two) times daily as needed for muscle spasms. 20 tablet 0   ferrous sulfate 325 (65 FE) MG tablet Take 1 tablet (325 mg total) by mouth daily. 30 tablet 0   fluconazole (DIFLUCAN) 200 MG tablet Take 1 tablet (200 mg total) by mouth every 3 (three) days. 3 tablet 5   fluticasone (FLONASE) 50 MCG/ACT nasal spray Place 1 spray into both nostrils daily for 3 days. 16 g 0   gabapentin (NEURONTIN) 300 MG capsule Take 1 capsule (300 mg total) by mouth 3 (three) times daily. 60 capsule 0   hydrOXYzine (ATARAX/VISTARIL) 25 MG tablet Take 1 tablet (25 mg total) by mouth every 6 (six) hours as needed for itching. 12 tablet 0   insulin aspart (NOVOLOG) 100 UNIT/ML FlexPen 0-20 Units,  Subcutaneous, 3 times daily with meals CBG < 70: Implement Hypoglycemia measures, call MD CBG 70 - 120: 0 units CBG 121 - 150: 3 units CBG 151 - 200: 4 units CBG 201 - 250: 7 units CBG 251 - 300: 11 units CBG 301 - 350: 15 units CBG 351 - 400: 20 units CBG > 400: call MD 15 mL 11   Insulin Pen Needle 32G X 4 MM MISC 1 each by Does not apply route as needed. 200 each 0   ketorolac (TORADOL) 10 MG tablet Take 1 tablet (10 mg total) by mouth every 6 (six) hours as needed. 20 tablet 0   LANTUS SOLOSTAR 100 UNIT/ML Solostar Pen 46 units in am and 36 units in pm 15  mL 0   megestrol (MEGACE) 40 MG tablet Take 1 tablet (40 mg total) by mouth daily. Can increase to two tablets twice a day in the event of heavy bleeding 60 tablet 5   metFORMIN (GLUCOPHAGE) 1000 MG tablet Take 1,000 mg by mouth 2 (two) times daily with a meal.      metoprolol tartrate (LOPRESSOR) 50 MG tablet Take 1 tablet (50 mg total) by mouth 2 (two) times daily. 60 tablet 0   nystatin (MYCOSTATIN/NYSTOP) powder Apply 1 application topically 3 (three) times daily. 30 g 0   triamcinolone cream (KENALOG) 0.1 % Apply 1 application topically 2 (two) times daily. (Patient not taking: Reported on 08/17/2021) 30 g 0   venlafaxine XR (EFFEXOR-XR) 75 MG 24 hr capsule Take 1 capsule (75 mg total) by mouth daily. (Patient not taking: Reported on 08/17/2021) 30 capsule 11   No current facility-administered medications on file prior to visit.    Allergies  Allergen Reactions   Other Itching    Ketchup   Social History   Occupational History   Not on file  Tobacco Use   Smoking status: Never   Smokeless tobacco: Never  Vaping Use   Vaping Use: Never used  Substance and Sexual Activity   Alcohol use: Yes    Comment: occ   Drug use: Never   Sexual activity: Yes    Partners: Male    Birth control/protection: Surgical   Family History  Problem Relation Age of Onset   Breast cancer Mother    Diabetes Mother    Hypertension Mother    Diabetes Maternal Grandmother     There is no immunization history on file for this patient.   Review of Systems: Negative except as noted in the HPI.   Objective: Vitals:   02/15/22 1331  BP: (!) 160/93   Deborah Mcclain is a pleasant 51 y.o. female in NAD. AAO X 3.  Vascular Examination: CFT <3 seconds b/l LE. Palpable DP pulse(s) b/l LE. Palpable PT pulse(s) b/l LE. Pedal hair absent. No pain with calf compression b/l. Lower extremity skin temperature gradient within normal limits. No edema noted b/l LE. No cyanosis or clubbing noted b/l  LE.  Dermatological Examination: Pedal integument with normal turgor, texture and tone b/l LE. No open wounds b/l. No interdigital macerations b/l. Toenails 1-5 b/l elongated, thickened, discolored with subungual debris. +Tenderness with dorsal palpation of nailplates. No hyperkeratotic or porokeratotic lesions present.  Neurological Examination: Pt has subjective symptoms of neuropathy. Proprioception intact bilaterally.  Musculoskeletal Examination: Normal muscle strength 5/5 to all lower extremity muscle groups bilaterally. Pes planus deformity noted bilateral LE.Marland Kitchen No pain, crepitus or joint limitation noted with ROM b/l LE.  Patient ambulates independently without assistive aids.  Footwear  Assessment: Does the patient wear appropriate shoes? Yes. Does the patient need inserts/orthotics? No.  ADA Risk Categorization: Low Risk :  Patient has all of the following: Intact protective sensation No prior foot ulcer  No severe deformity Pedal pulses present  Assessment: 1. Pain due to onychomycosis of toenails of both feet   2. Pes planus of both feet   3. Diabetic peripheral neuropathy associated with type 2 diabetes mellitus ([place])   4. Encounter for diabetic foot exam ([place])     Plan: No orders of the defined types were placed in this encounter.  -Consent given for treatment as described below: -Examined patient. -Diabetic foot examination performed today. -Continue diabetic foot care principles: inspect feet daily, monitor glucose as recommended by PCP and/or Endocrinologist, and follow prescribed diet per PCP, Endocrinologist and/or dietician. -Toenails 1-5 b/l were debrided in length and girth with sterile nail nippers and dremel without iatrogenic bleeding.  -Patient/POA to call should there be question/concern in the interim. Return in about 3 months (around 05/17/2022).  Marzetta Board, DPM

## 2022-03-25 ENCOUNTER — Other Ambulatory Visit: Payer: Self-pay | Admitting: Physician Assistant

## 2022-03-25 DIAGNOSIS — R7989 Other specified abnormal findings of blood chemistry: Secondary | ICD-10-CM

## 2022-03-31 ENCOUNTER — Ambulatory Visit: Payer: Medicaid Other

## 2022-04-13 ENCOUNTER — Ambulatory Visit
Admission: RE | Admit: 2022-04-13 | Discharge: 2022-04-13 | Disposition: A | Discharge status: Home / Self Care | Payer: Medicaid Other | Source: Ambulatory Visit | Attending: Physician Assistant | Admitting: Physician Assistant

## 2022-04-13 DIAGNOSIS — R7989 Other specified abnormal findings of blood chemistry: Secondary | ICD-10-CM

## 2022-05-16 ENCOUNTER — Ambulatory Visit: Payer: Medicaid Other

## 2022-05-19 ENCOUNTER — Ambulatory Visit
Admission: RE | Admit: 2022-05-19 | Discharge: 2022-05-19 | Disposition: A | Discharge status: Home / Self Care | Payer: Medicaid Other | Source: Ambulatory Visit | Attending: Family Medicine | Admitting: Family Medicine

## 2022-05-19 VITALS — BP 122/68 | HR 77 | Temp 97.9°F | Resp 16

## 2022-05-19 DIAGNOSIS — M5441 Lumbago with sciatica, right side: Secondary | ICD-10-CM

## 2022-05-19 MED ORDER — KETOROLAC TROMETHAMINE 10 MG PO TABS: 10.0000 mg | ORAL_TABLET | Freq: Four times a day (QID) | ORAL | 0 refills | Status: DC | PRN

## 2022-05-19 MED ORDER — CYCLOBENZAPRINE HCL 10 MG PO TABS: 10.0000 mg | ORAL_TABLET | Freq: Two times a day (BID) | ORAL | 0 refills | Status: DC | PRN

## 2022-05-19 MED ORDER — KETOROLAC TROMETHAMINE 15 MG/ML IJ SOLN
30.0000 mg | Freq: Once | INTRAMUSCULAR | Status: AC
  Administered 2022-05-19: 30 mg via INTRAMUSCULAR

## 2022-05-19 MED ORDER — KETOROLAC TROMETHAMINE 15 MG/ML IJ SOLN: 30.0000 mg | Freq: Once | INTRAMUSCULAR | Status: DC

## 2022-05-19 NOTE — ED Provider Notes (Addendum)
[name]    [name]: [id number] Arrival date & time: 05/19/22  1301      History   Chief Complaint Chief Complaint  Patient presents with   Back Pain    Extreme pain in lower back and legs - Entered by patient    HPI Deborah Mcclain is a 52 y.o. female.    Back Pain  Here for right low back pain that is radiating into her right lower leg.  This began about 2 weeks ago.  No recent trauma or fall.  Previously muscle relaxers and medication for pain have helped.  Also physical therapy has helped in the past.  No bowel or bladder incontinence.  No muscle weakness.  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   Leucocytosis 09/10/2020   Vitamin D deficiency 09/10/2020   Hyperlipidemia 01/20/2020   Iron deficiency anemia 01/20/2020   Hyperglycemia due to diabetes mellitus ([place]) 01/09/2020   Anemia 12/03/2019   Abnormal uterine bleeding (AUB) 12/03/2019   History of blood transfusion 12/03/2019   Essential hypertension 04/16/2019   Diabetes mellitus type 2 in obese ([place]) 04/16/2019   Morbid obesity with BMI of 50.0-59.9, adult ([place]) 04/16/2019   Menopausal vasomotor syndrome 04/16/2019    Past Surgical History:  Procedure Laterality Date   CESAREAN SECTION  05/25/1997   CESAREAN SECTION  03/30/2005   twins   ENDOMETRIAL BIOPSY  12/03/2019    OB History     Gravida  2   Para  2   Term  1   Preterm  1   AB      Living  3      SAB      IAB      Ectopic      Multiple  1   Live Births  3            Home Medications    Prior to  Admission medications   Medication Sig Start Date End Date Taking? Authorizing Provider  cyclobenzaprine (FLEXERIL) 10 MG tablet Take 1 tablet (10 mg total) by mouth 2 (two) times daily as needed for muscle spasms. 05/19/22  Yes Barrett Henle, MD  ketorolac (TORADOL) 10 MG tablet Take 1 tablet (10 mg total) by mouth every 6 (six) hours as needed (pain). 05/19/22  Yes Tray Klayman, Gwenlyn Perking, MD  albuterol (VENTOLIN HFA) 108 (90 Base) MCG/ACT inhaler INHALE 1 TO 2 PUFFS BY MOUTH EVERY 4 HOURS AS NEEDED 07/01/19   [provider]  amLODipine (NORVASC) 10 MG tablet Take 10 mg by mouth daily. 07/12/21   [provider]  atorvastatin (LIPITOR) 40 MG tablet Take 1 tablet (40 mg total) by mouth at bedtime. 01/12/20   Ghimire, Henreitta Leber, MD  cetirizine (ZYRTEC) 10 MG tablet Take 1 tablet (10 mg total) by mouth daily. 01/16/18   Bast, Tressia Miners A, NP  fluticasone (FLONASE) 50 MCG/ACT nasal spray Place 1 spray into both nostrils daily for 3 days. 12/03/21 12/06/21  Teodora Medici, FNP  insulin aspart (NOVOLOG) 100 UNIT/ML FlexPen 0-20 Units, Subcutaneous, 3  times daily with meals CBG < 70: Implement Hypoglycemia measures, call MD CBG 70 - 120: 0 units CBG 121 - 150: 3 units CBG 151 - 200: 4 units CBG 201 - 250: 7 units CBG 251 - 300: 11 units CBG 301 - 350: 15 units CBG 351 - 400: 20 units CBG > 400: call MD 06/30/20   Scot Jun, NP  Insulin Pen Needle 32G X 4 MM MISC 1 each by Does not apply route as needed. 01/12/20   Ghimire, Henreitta Leber, MD  LANTUS SOLOSTAR 100 UNIT/ML Solostar Pen 46 units in am and 36 units in pm 12/03/21   Teodora Medici, FNP  metFORMIN (GLUCOPHAGE) 1000 MG tablet Take 1,000 mg by mouth 2 (two) times daily with a meal.     [provider]  metoprolol tartrate (LOPRESSOR) 50 MG tablet Take 1 tablet (50 mg total) by mouth 2 (two) times daily. 01/12/20   Ghimire, Henreitta Leber, MD  pregabalin (LYRICA) 25 MG capsule Take 25 mg by mouth 2 (two) times daily.    [provider]  triamcinolone cream (KENALOG) 0.1 % Apply 1 application topically 2 (two) times daily. Patient not taking: Reported on 08/17/2021 07/24/20   Wieters, Hallie C, PA-C  venlafaxine XR (EFFEXOR-XR) 75 MG 24 hr capsule Take 1 capsule (75 mg total) by mouth daily. Patient not taking: Reported on 08/17/2021 12/26/19   Leftwich-Kirby, Kathie Dike, CNM    Family History Family History  Problem Relation Age of Onset   Breast cancer Mother    Diabetes Mother    Hypertension Mother    Diabetes Maternal Grandmother     Social History Social History   Tobacco Use   Smoking status: Never   Smokeless tobacco: Never  Vaping Use   Vaping Use: Never used  Substance Use Topics   Alcohol use: Yes    Comment: occ   Drug use: Never     Allergies   Other   Review of Systems Review of Systems  Musculoskeletal:  Positive for back pain.     Physical Exam Triage Vital Signs ED Triage Vitals  Enc Vitals Group     BP 05/19/22 1333 122/68     Pulse Rate 05/19/22 1333 77     Resp 05/19/22 1333 16     Temp 05/19/22 1333 97.9 F (36.6 C)     Temp Source 05/19/22 1333 Oral     SpO2 05/19/22 1333 94 %     Weight --      Height --      Head Circumference --      Peak Flow --      Pain Score 05/19/22 1334 8     Pain Loc --      Pain Edu? --      Excl. in [place]? --    No data found.  Updated Vital Signs BP 122/68 (BP Location: Left Arm)   Pulse 77   Temp 97.9 F (36.6 C) (Oral)   Resp 16   LMP 01/31/2020 (Approximate)   SpO2 94%   Visual Acuity Right Eye Distance:   Left Eye Distance:   Bilateral Distance:    Right Eye Near:   Left Eye Near:    Bilateral Near:     Physical Exam Vitals reviewed.  Constitutional:      General: She is not in acute distress.    Appearance: She is not ill-appearing, toxic-appearing or diaphoretic.  HENT:     Mouth/Throat:  Mouth: Mucous membranes are moist.  Eyes:     Extraocular Movements: Extraocular movements intact.      Conjunctiva/sclera: Conjunctivae normal.     Pupils: Pupils are equal, round, and reactive to light.  Cardiovascular:     Rate and Rhythm: Normal rate and regular rhythm.     Heart sounds: No murmur heard. Pulmonary:     Effort: Pulmonary effort is normal.     Breath sounds: Normal breath sounds.  Musculoskeletal:     Comments: Straight leg raise causes pain to radiate down to her right calf.  Skin:    Coloration: Skin is not jaundiced or pale.  Neurological:     General: No focal deficit present.     Mental Status: She is alert and oriented to person, place, and time.  Psychiatric:        Behavior: Behavior normal.      UC Treatments / Results  Labs (all labs ordered are listed, but only abnormal results are displayed) Labs Reviewed - No data to display  EKG   Radiology No results found.  Procedures Procedures (including critical care time)  Medications Ordered in UC Medications  ketorolac (TORADOL) 15 MG/ML injection 30 mg (has no administration in time range)  ketorolac (TORADOL) 15 MG/ML injection 30 mg (has no administration in time range)    Initial Impression / Assessment and Plan / UC Course  I have reviewed the triage vital signs and the nursing notes.  Pertinent labs & imaging results that were available during my care of the patient were reviewed by me and considered in my medical decision making (see chart for details).        Toradol injection is given here, and Toradol prescription is sent in to use for up to 5 days.  Cyclobenzaprine is sent in for muscle relaxer.  She will follow-up with her primary care so that she can have physical therapy referral if needed  Since no new trauma, no imaging done here today Final Clinical Impressions(s) / UC Diagnoses   Final diagnoses:  Acute right-sided low back pain with right-sided sciatica     Discharge Instructions      You have been given a shot of Toradol 30 mg today.  Ketorolac 10 mg/Toradol  tablets--1 tablet every 6 hours as needed for pain.  Cyclobenzaprine 10 mg--1 twice daily as needed for muscle spasm.  This medication can make you sleepy  Please follow-up with your primary care     ED Prescriptions     Medication Sig Dispense Auth. Provider   ketorolac (TORADOL) 10 MG tablet Take 1 tablet (10 mg total) by mouth every 6 (six) hours as needed (pain). 20 tablet Sirius Woodford, Gwenlyn Perking, MD   cyclobenzaprine (FLEXERIL) 10 MG tablet Take 1 tablet (10 mg total) by mouth 2 (two) times daily as needed for muscle spasms. 20 tablet Fancy Dunkley, Gwenlyn Perking, MD      PDMP not reviewed this encounter.   Barrett Henle, MD 05/19/22 1354    Barrett Henle, MD 05/19/22 [phone]

## 2022-05-19 NOTE — ED Triage Notes (Signed)
Pt states lower back pain for the past 2 weeks that radiates down her right leg. State she has a history of sciatica. Has been taking Motrin and tylenol  at home with some relief

## 2022-05-19 NOTE — Discharge Instructions (Signed)
You have been given a shot of Toradol 30 mg today.  Ketorolac 10 mg/Toradol tablets--1 tablet every 6 hours as needed for pain.  Cyclobenzaprine 10 mg--1 twice daily as needed for muscle spasm.  This medication can make you sleepy  Please follow-up with your primary care

## 2022-05-24 ENCOUNTER — Ambulatory Visit: Payer: Medicaid Other

## 2022-05-30 ENCOUNTER — Ambulatory Visit: Payer: Medicaid Other

## 2022-05-31 ENCOUNTER — Other Ambulatory Visit: Payer: Self-pay

## 2022-05-31 ENCOUNTER — Ambulatory Visit
Admission: RE | Admit: 2022-05-31 | Discharge: 2022-05-31 | Disposition: A | Discharge status: Home / Self Care | Payer: Medicaid Other | Source: Ambulatory Visit | Attending: Physician Assistant | Admitting: Physician Assistant

## 2022-05-31 VITALS — BP 176/96 | HR 97 | Temp 98.0°F | Resp 18

## 2022-05-31 DIAGNOSIS — R109 Unspecified abdominal pain: Secondary | ICD-10-CM | POA: Diagnosis not present

## 2022-05-31 DIAGNOSIS — M545 Low back pain, unspecified: Secondary | ICD-10-CM

## 2022-05-31 NOTE — Discharge Instructions (Signed)
Please go to the emergency department as soon as you leave urgent care for further evaluation and management. ?

## 2022-05-31 NOTE — ED Provider Notes (Signed)
[name]    [name]: [id number] Arrival date & time: 05/31/22  1128      History   Chief Complaint Chief Complaint  Patient presents with   Abdominal Pain    Have an inflamed gallbladder - Entered by patient    HPI Deborah Mcclain is a 52 y.o. female.   Patient presents with right-sided abdominal pain that radiates into her right lower back that has been present for multiple weeks.  Patient reports that she was seen here on 3/7 and diagnosed with lower back pain with sciatica.  She was prescribed a muscle relaxer with no improvement in pain.  Patient reports that abdominal pain and back pain have increased over the past few days.  She describes it as a sharp, constant pain.  Denies nausea, vomiting, diarrhea, constipation.  Patient having normal bowel movements.  Denies any fever.  Reports that she saw PCP on 3/14 and was told that she had inflamed gallbladder with gallstones.  She is scheduled for ultrasound in a few days.   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   Leucocytosis 09/10/2020   Vitamin D deficiency 09/10/2020   Hyperlipidemia 01/20/2020   Iron deficiency anemia 01/20/2020   Hyperglycemia due to diabetes mellitus ([place]) 01/09/2020   Anemia 12/03/2019   Abnormal uterine bleeding (AUB) 12/03/2019   History of blood transfusion 12/03/2019   Essential hypertension 04/16/2019   Diabetes mellitus type 2 in obese ([place]) 04/16/2019   Morbid obesity with BMI of 50.0-59.9, adult ([place]) 04/16/2019   Menopausal vasomotor syndrome 04/16/2019     Past Surgical History:  Procedure Laterality Date   CESAREAN SECTION  05/25/1997   CESAREAN SECTION  03/30/2005   twins   ENDOMETRIAL BIOPSY  12/03/2019    OB History     Gravida  2   Para  2   Term  1   Preterm  1   AB      Living  3      SAB      IAB      Ectopic      Multiple  1   Live Births  3            Home Medications    Prior to Admission medications   Medication Sig Start Date End Date Taking? Authorizing Provider  albuterol (VENTOLIN HFA) 108 (90 Base) MCG/ACT inhaler INHALE 1 TO 2 PUFFS BY MOUTH EVERY 4 HOURS AS NEEDED 07/01/19   [provider]  amLODipine (NORVASC) 10 MG tablet Take 10 mg by mouth daily. 07/12/21   [provider]  atorvastatin (LIPITOR) 40 MG tablet Take 1 tablet (40 mg total) by mouth at bedtime. 01/12/20   Ghimire, Henreitta Leber, MD  cetirizine (ZYRTEC) 10 MG tablet Take 1 tablet (10 mg total) by mouth daily. 01/16/18   Loura Halt A, NP  cyclobenzaprine (FLEXERIL) 10 MG tablet Take 1 tablet (10 mg total) by mouth 2 (two) times daily as needed for muscle spasms. 05/19/22   Barrett Henle, MD  fluticasone (  FLONASE) 50 MCG/ACT nasal spray Place 1 spray into both nostrils daily for 3 days. 12/03/21 12/06/21  Teodora Medici, FNP  insulin aspart (NOVOLOG) 100 UNIT/ML FlexPen 0-20 Units, Subcutaneous, 3 times daily with meals CBG < 70: Implement Hypoglycemia measures, call MD CBG 70 - 120: 0 units CBG 121 - 150: 3 units CBG 151 - 200: 4 units CBG 201 - 250: 7 units CBG 251 - 300: 11 units CBG 301 - 350: 15 units CBG 351 - 400: 20 units CBG > 400: call MD 06/30/20   Scot Jun, NP  Insulin Pen Needle 32G X 4 MM MISC 1 each by Does not apply route as needed. 01/12/20   Ghimire, Henreitta Leber, MD  ketorolac (TORADOL) 10 MG tablet Take 1 tablet (10 mg total) by mouth every 6 (six) hours as needed (pain). 05/19/22   Barrett Henle, MD  LANTUS SOLOSTAR 100 UNIT/ML Solostar Pen 46 units in am and 36 units in pm 12/03/21    Teodora Medici, FNP  metFORMIN (GLUCOPHAGE) 1000 MG tablet Take 1,000 mg by mouth 2 (two) times daily with a meal.     [provider]  metoprolol tartrate (LOPRESSOR) 50 MG tablet Take 1 tablet (50 mg total) by mouth 2 (two) times daily. 01/12/20   Ghimire, Henreitta Leber, MD  pregabalin (LYRICA) 25 MG capsule Take 25 mg by mouth 2 (two) times daily.    [provider]  triamcinolone cream (KENALOG) 0.1 % Apply 1 application topically 2 (two) times daily. Patient not taking: Reported on 08/17/2021 07/24/20   Wieters, Hallie C, PA-C  venlafaxine XR (EFFEXOR-XR) 75 MG 24 hr capsule Take 1 capsule (75 mg total) by mouth daily. Patient not taking: Reported on 08/17/2021 12/26/19   Leftwich-Kirby, Kathie Dike, CNM    Family History Family History  Problem Relation Age of Onset   Breast cancer Mother    Diabetes Mother    Hypertension Mother    Diabetes Maternal Grandmother     Social History Social History   Tobacco Use   Smoking status: Never   Smokeless tobacco: Never  Vaping Use   Vaping Use: Never used  Substance Use Topics   Alcohol use: Yes    Comment: occ   Drug use: Never     Allergies   Other   Review of Systems Review of Systems Per HPI  Physical Exam Triage Vital Signs ED Triage Vitals  Enc Vitals Group     BP 05/31/22 1223 (!) 176/96     Pulse Rate 05/31/22 1223 97     Resp 05/31/22 1223 18     Temp 05/31/22 1223 98 F (36.7 C)     Temp Source 05/31/22 1223 Oral     SpO2 05/31/22 1223 100 %     Weight --      Height --      Head Circumference --      Peak Flow --      Pain Score 05/31/22 1224 7     Pain Loc --      Pain Edu? --      Excl. in [place]? --    No data found.  Updated Vital Signs BP (!) 176/96 (BP Location: Left Arm)   Pulse 97   Temp 98 F (36.7 C) (Oral)   Resp 18   LMP 01/31/2020 (Approximate)   SpO2 100%   Visual Acuity Right Eye Distance:   Left Eye Distance:   Bilateral Distance:  Right Eye Near:   Left Eye Near:     Bilateral Near:     Physical Exam Constitutional:      General: She is not in acute distress.    Appearance: Normal appearance. She is not toxic-appearing or diaphoretic.  HENT:     Head: Normocephalic and atraumatic.  Eyes:     Extraocular Movements: Extraocular movements intact.     Conjunctiva/sclera: Conjunctivae normal.  Cardiovascular:     Rate and Rhythm: Normal rate and regular rhythm.     Pulses: Normal pulses.     Heart sounds: Normal heart sounds.  Pulmonary:     Effort: Pulmonary effort is normal. No respiratory distress.     Breath sounds: Normal breath sounds.  Abdominal:     General: Bowel sounds are normal. There is no distension.     Palpations: Abdomen is soft.     Tenderness: There is abdominal tenderness.     Comments: Patient is significantly tender to palpation to right upper quadrant and right lower quadrant.  Mild tenderness to palpation to left mid lower abdomen as well.  Musculoskeletal:     Comments: Patient is significantly tender to palpation to right lower lumbar region.  No obvious direct spinal tenderness, crepitus, step-off noted.  Neurological:     General: No focal deficit present.     Mental Status: She is alert and oriented to person, place, and time. Mental status is at baseline.  Psychiatric:        Mood and Affect: Mood normal.        Behavior: Behavior normal.        Thought Content: Thought content normal.        Judgment: Judgment normal.      UC Treatments / Results  Labs (all labs ordered are listed, but only abnormal results are displayed) Labs Reviewed - No data to display  EKG   Radiology No results found.  Procedures Procedures (including critical care time)  Medications Ordered in UC Medications - No data to display  Initial Impression / Assessment and Plan / UC Course  I have reviewed the triage vital signs and the nursing notes.  Pertinent labs & imaging results that were available during my care of the  patient were reviewed by me and considered in my medical decision making (see chart for details).     Patient is significantly tender to palpation on abdominal exam and pain is increased over the past few days.  I do think more advanced imaging is necessary due to this.  Advised patient to go to the ER for further evaluation and management.  Patient was agreeable with plan.  Vital signs stable at discharge.  Agree with patient self transport to the ER. Final Clinical Impressions(s) / UC Diagnoses   Final diagnoses:  Right sided abdominal pain  Right-sided low back pain without sciatica, unspecified chronicity     Discharge Instructions      Please go to the emergency department as soon as you leave urgent care for further evaluation and management.    ED Prescriptions   None    PDMP not reviewed this encounter.   Teodora Medici, [place] 05/31/22 1237

## 2022-05-31 NOTE — ED Triage Notes (Signed)
Pt here for right sided abd pain into back with known gall bladder issues; pt sts pain worse over the last week

## 2022-06-01 ENCOUNTER — Encounter (HOSPITAL_COMMUNITY): Payer: Self-pay

## 2022-06-01 ENCOUNTER — Emergency Department (HOSPITAL_BASED_OUTPATIENT_CLINIC_OR_DEPARTMENT_OTHER): Payer: Medicaid Other | Admitting: Certified Registered Nurse Anesthetist

## 2022-06-01 ENCOUNTER — Other Ambulatory Visit: Payer: Self-pay

## 2022-06-01 ENCOUNTER — Emergency Department (HOSPITAL_COMMUNITY): Payer: Medicaid Other

## 2022-06-01 ENCOUNTER — Emergency Department (HOSPITAL_COMMUNITY): Payer: Medicaid Other | Admitting: Certified Registered Nurse Anesthetist

## 2022-06-01 ENCOUNTER — Encounter (HOSPITAL_COMMUNITY): Admission: EM | Disposition: A | Discharge status: Home / Self Care | Payer: Self-pay | Source: Home / Self Care

## 2022-06-01 ENCOUNTER — Inpatient Hospital Stay (HOSPITAL_COMMUNITY)
Admission: EM | Admit: 2022-06-01 | Discharge: 2022-06-03 | DRG: 418 | Disposition: A | Discharge status: Home / Self Care | Payer: Medicaid Other | Attending: Surgery | Admitting: Surgery

## 2022-06-01 ENCOUNTER — Ambulatory Visit: Payer: Medicaid Other | Admitting: Podiatry

## 2022-06-01 DIAGNOSIS — Z91018 Allergy to other foods: Secondary | ICD-10-CM

## 2022-06-01 DIAGNOSIS — Z6841 Body Mass Index (BMI) 40.0 and over, adult: Secondary | ICD-10-CM

## 2022-06-01 DIAGNOSIS — Z7984 Long term (current) use of oral hypoglycemic drugs: Secondary | ICD-10-CM

## 2022-06-01 DIAGNOSIS — Z79899 Other long term (current) drug therapy: Secondary | ICD-10-CM

## 2022-06-01 DIAGNOSIS — J45909 Unspecified asthma, uncomplicated: Secondary | ICD-10-CM

## 2022-06-01 DIAGNOSIS — K810 Acute cholecystitis: Secondary | ICD-10-CM | POA: Diagnosis not present

## 2022-06-01 DIAGNOSIS — F419 Anxiety disorder, unspecified: Secondary | ICD-10-CM | POA: Diagnosis present

## 2022-06-01 DIAGNOSIS — Z794 Long term (current) use of insulin: Secondary | ICD-10-CM

## 2022-06-01 DIAGNOSIS — R7401 Elevation of levels of liver transaminase levels: Secondary | ICD-10-CM | POA: Diagnosis present

## 2022-06-01 DIAGNOSIS — I10 Essential (primary) hypertension: Secondary | ICD-10-CM

## 2022-06-01 DIAGNOSIS — E1165 Type 2 diabetes mellitus with hyperglycemia: Secondary | ICD-10-CM

## 2022-06-01 DIAGNOSIS — K760 Fatty (change of) liver, not elsewhere classified: Secondary | ICD-10-CM | POA: Diagnosis present

## 2022-06-01 DIAGNOSIS — K8000 Calculus of gallbladder with acute cholecystitis without obstruction: Principal | ICD-10-CM | POA: Diagnosis present

## 2022-06-01 DIAGNOSIS — Z833 Family history of diabetes mellitus: Secondary | ICD-10-CM

## 2022-06-01 DIAGNOSIS — E119 Type 2 diabetes mellitus without complications: Secondary | ICD-10-CM | POA: Diagnosis present

## 2022-06-01 DIAGNOSIS — Z8249 Family history of ischemic heart disease and other diseases of the circulatory system: Secondary | ICD-10-CM

## 2022-06-01 HISTORY — PX: CHOLECYSTECTOMY: SHX55

## 2022-06-01 LAB — COMPREHENSIVE METABOLIC PANEL
ALT: 70 U/L — ABNORMAL HIGH (ref 0–44)
AST: 59 U/L — ABNORMAL HIGH (ref 15–41)
Albumin: 3.8 g/dL (ref 3.5–5.0)
Alkaline Phosphatase: 138 U/L — ABNORMAL HIGH (ref 38–126)
Anion gap: 14 (ref 5–15)
BUN: 7 mg/dL (ref 6–20)
CO2: 21 mmol/L — ABNORMAL LOW (ref 22–32)
Calcium: 9.6 mg/dL (ref 8.9–10.3)
Chloride: 100 mmol/L (ref 98–111)
Creatinine, Ser: 0.81 mg/dL (ref 0.44–1.00)
GFR, Estimated: >60 mL/min (ref >60)
Glucose, Bld: 267 mg/dL — ABNORMAL HIGH (ref 70–99)
Potassium: 4.7 mmol/L (ref 3.5–5.1)
Sodium: 135 mmol/L (ref 135–145)
Total Bilirubin: 0.9 mg/dL (ref 0.3–1.2)
Total Protein: 7.8 g/dL (ref 6.5–8.1)

## 2022-06-01 LAB — CBC WITH DIFFERENTIAL/PLATELET
Abs Immature Granulocytes: 0.04 10*3/uL (ref 0.00–0.07)
Basophils Absolute: 0.1 10*3/uL (ref 0.0–0.1)
Basophils Relative: 1 %
Eosinophils Absolute: 0.4 10*3/uL (ref 0.0–0.5)
Eosinophils Relative: 4 %
HCT: 43 % (ref 36.0–46.0)
Hemoglobin: 13.9 g/dL (ref 12.0–15.0)
Immature Granulocytes: 0 %
Lymphocytes Relative: 30 %
Lymphs Abs: 3.4 10*3/uL (ref 0.7–4.0)
MCH: 28.4 pg (ref 26.0–34.0)
MCHC: 32.3 g/dL (ref 30.0–36.0)
MCV: 87.8 fL (ref 80.0–100.0)
Monocytes Absolute: 0.6 10*3/uL (ref 0.1–1.0)
Monocytes Relative: 6 %
Neutro Abs: 6.7 10*3/uL (ref 1.7–7.7)
Neutrophils Relative %: 59 %
Platelets: 368 10*3/uL (ref 150–400)
RBC: 4.9 MIL/uL (ref 3.87–5.11)
RDW: 12.4 % (ref 11.5–15.5)
WBC: 11.3 10*3/uL — ABNORMAL HIGH (ref 4.0–10.5)
nRBC: 0 % (ref 0.0–0.2)

## 2022-06-01 LAB — URINALYSIS, ROUTINE W REFLEX MICROSCOPIC
Bilirubin Urine: NEGATIVE
Glucose, UA: >=500 mg/dL — AB
Ketones, ur: NEGATIVE mg/dL
Nitrite: NEGATIVE
Protein, ur: 100 mg/dL — AB
Specific Gravity, Urine: 1.016 (ref 1.005–1.030)
WBC, UA: >50 WBC/hpf (ref 0–5)
pH: 5 (ref 5.0–8.0)

## 2022-06-01 LAB — GLUCOSE, CAPILLARY
Glucose-Capillary: 197 mg/dL — ABNORMAL HIGH (ref 70–99)
Glucose-Capillary: 201 mg/dL — ABNORMAL HIGH (ref 70–99)
Glucose-Capillary: 267 mg/dL — ABNORMAL HIGH (ref 70–99)
Glucose-Capillary: 303 mg/dL — ABNORMAL HIGH (ref 70–99)

## 2022-06-01 LAB — LIPASE, BLOOD: Lipase: 31 U/L (ref 11–51)

## 2022-06-01 LAB — HIV ANTIBODY (ROUTINE TESTING W REFLEX): HIV Screen 4th Generation wRfx: NONREACTIVE

## 2022-06-01 SURGERY — LAPAROSCOPIC CHOLECYSTECTOMY
Anesthesia: General | Site: Abdomen

## 2022-06-01 MED ORDER — ACETAMINOPHEN 325 MG PO TABS
650.0000 mg | ORAL_TABLET | Freq: Four times a day (QID) | ORAL | Status: DC
  Administered 2022-06-01 – 2022-06-03 (×6): 650 mg via ORAL
  Filled 2022-06-01 (×6): qty 2

## 2022-06-01 MED ORDER — DIPHENHYDRAMINE HCL 50 MG/ML IJ SOLN: 25.0000 mg | Freq: Four times a day (QID) | INTRAMUSCULAR | Status: DC | PRN

## 2022-06-01 MED ORDER — VENLAFAXINE HCL ER 75 MG PO CP24
75.0000 mg | ORAL_CAPSULE | Freq: Every day | ORAL | Status: DC
  Administered 2022-06-02 – 2022-06-03 (×2): 75 mg via ORAL
  Filled 2022-06-01 (×3): qty 1

## 2022-06-01 MED ORDER — PROPOFOL 10 MG/ML IV BOLUS
INTRAVENOUS | Status: AC
  Filled 2022-06-01: qty 20

## 2022-06-01 MED ORDER — METOPROLOL TARTRATE 50 MG PO TABS: 50.0000 mg | ORAL_TABLET | Freq: Once | ORAL | Status: AC

## 2022-06-01 MED ORDER — FENTANYL CITRATE (PF) 250 MCG/5ML IJ SOLN
INTRAMUSCULAR | Status: AC
  Filled 2022-06-01: qty 5

## 2022-06-01 MED ORDER — CHLORHEXIDINE GLUCONATE 0.12 % MT SOLN: 15.0000 mL | Freq: Once | OROMUCOSAL | Status: DC

## 2022-06-01 MED ORDER — SODIUM CHLORIDE 0.9 % IV SOLN: INTRAVENOUS | Status: DC

## 2022-06-01 MED ORDER — PANTOPRAZOLE SODIUM 40 MG IV SOLR
40.0000 mg | Freq: Every day | INTRAVENOUS | Status: DC
  Filled 2022-06-01: qty 10

## 2022-06-01 MED ORDER — EPINEPHRINE PF 1 MG/ML IJ SOLN
INTRAMUSCULAR | Status: AC
  Filled 2022-06-01: qty 1

## 2022-06-01 MED ORDER — OXYCODONE HCL 5 MG PO TABS
5.0000 mg | ORAL_TABLET | ORAL | Status: DC | PRN
  Administered 2022-06-02 – 2022-06-03 (×5): 5 mg via ORAL
  Filled 2022-06-01 (×5): qty 1

## 2022-06-01 MED ORDER — DEXAMETHASONE SODIUM PHOSPHATE 10 MG/ML IJ SOLN
INTRAMUSCULAR | Status: AC
  Filled 2022-06-01: qty 1

## 2022-06-01 MED ORDER — ACETAMINOPHEN 325 MG PO TABS: 650.0000 mg | ORAL_TABLET | Freq: Four times a day (QID) | ORAL | Status: DC | PRN

## 2022-06-01 MED ORDER — LACTATED RINGERS IV SOLN: INTRAVENOUS | Status: DC

## 2022-06-01 MED ORDER — AMISULPRIDE (ANTIEMETIC) 5 MG/2ML IV SOLN: 10.0000 mg | Freq: Once | INTRAVENOUS | Status: DC | PRN

## 2022-06-01 MED ORDER — ORAL CARE MOUTH RINSE: 15.0000 mL | Freq: Once | OROMUCOSAL | Status: DC

## 2022-06-01 MED ORDER — DIPHENHYDRAMINE HCL 25 MG PO CAPS: 25.0000 mg | ORAL_CAPSULE | Freq: Four times a day (QID) | ORAL | Status: DC | PRN

## 2022-06-01 MED ORDER — CHLORHEXIDINE GLUCONATE CLOTH 2 % EX PADS: 6.0000 | MEDICATED_PAD | Freq: Once | CUTANEOUS | Status: DC

## 2022-06-01 MED ORDER — POLYETHYLENE GLYCOL 3350 17 G PO PACK: 17.0000 g | PACK | Freq: Every day | ORAL | Status: DC | PRN

## 2022-06-01 MED ORDER — INSULIN ASPART 100 UNIT/ML IJ SOLN
0.0000 [IU] | Freq: Every day | INTRAMUSCULAR | Status: DC
  Administered 2022-06-01: 4 [IU] via SUBCUTANEOUS
  Administered 2022-06-02: 3 [IU] via SUBCUTANEOUS

## 2022-06-01 MED ORDER — ONDANSETRON HCL 4 MG/2ML IJ SOLN: 4.0000 mg | Freq: Four times a day (QID) | INTRAMUSCULAR | Status: DC | PRN

## 2022-06-01 MED ORDER — ONDANSETRON HCL 4 MG/2ML IJ SOLN
INTRAMUSCULAR | Status: DC | PRN
  Administered 2022-06-01: 4 mg via INTRAVENOUS

## 2022-06-01 MED ORDER — MIDAZOLAM HCL 2 MG/2ML IJ SOLN
INTRAMUSCULAR | Status: DC | PRN
  Administered 2022-06-01: 2 mg via INTRAVENOUS

## 2022-06-01 MED ORDER — MORPHINE SULFATE (PF) 2 MG/ML IV SOLN: 2.0000 mg | INTRAVENOUS | Status: DC | PRN

## 2022-06-01 MED ORDER — PROPOFOL 10 MG/ML IV BOLUS
INTRAVENOUS | Status: DC | PRN
  Administered 2022-06-01: 200 mg via INTRAVENOUS

## 2022-06-01 MED ORDER — PHENYLEPHRINE HCL-NACL 20-0.9 MG/250ML-% IV SOLN
INTRAVENOUS | Status: DC | PRN
  Administered 2022-06-01: 30 ug/min via INTRAVENOUS

## 2022-06-01 MED ORDER — LIDOCAINE 2% (20 MG/ML) 5 ML SYRINGE
INTRAMUSCULAR | Status: DC | PRN
  Administered 2022-06-01: 100 mg via INTRAVENOUS

## 2022-06-01 MED ORDER — SIMETHICONE 80 MG PO CHEW: 40.0000 mg | CHEWABLE_TABLET | Freq: Four times a day (QID) | ORAL | Status: DC | PRN

## 2022-06-01 MED ORDER — PROMETHAZINE HCL 25 MG/ML IJ SOLN: 6.2500 mg | INTRAMUSCULAR | Status: DC | PRN

## 2022-06-01 MED ORDER — SODIUM CHLORIDE 0.9 % IV SOLN: INTRAVENOUS | Status: DC | PRN

## 2022-06-01 MED ORDER — PREGABALIN 25 MG PO CAPS
25.0000 mg | ORAL_CAPSULE | Freq: Two times a day (BID) | ORAL | Status: DC
  Administered 2022-06-01 – 2022-06-03 (×4): 25 mg via ORAL
  Filled 2022-06-01 (×4): qty 1

## 2022-06-01 MED ORDER — INSULIN ASPART 100 UNIT/ML IJ SOLN
0.0000 [IU] | Freq: Three times a day (TID) | INTRAMUSCULAR | Status: DC
  Administered 2022-06-01 – 2022-06-02 (×2): 8 [IU] via SUBCUTANEOUS
  Administered 2022-06-02: 11 [IU] via SUBCUTANEOUS
  Administered 2022-06-02: 8 [IU] via SUBCUTANEOUS
  Administered 2022-06-03 (×2): 5 [IU] via SUBCUTANEOUS

## 2022-06-01 MED ORDER — FENTANYL CITRATE (PF) 250 MCG/5ML IJ SOLN
INTRAMUSCULAR | Status: DC | PRN
  Administered 2022-06-01: 50 ug via INTRAVENOUS
  Administered 2022-06-01: 100 ug via INTRAVENOUS
  Administered 2022-06-01: 50 ug via INTRAVENOUS

## 2022-06-01 MED ORDER — SODIUM CHLORIDE 0.9 % IV SOLN
1.0000 g | Freq: Once | INTRAVENOUS | Status: AC
  Administered 2022-06-01: 1 g via INTRAVENOUS
  Filled 2022-06-01: qty 10

## 2022-06-01 MED ORDER — DOCUSATE SODIUM 100 MG PO CAPS
100.0000 mg | ORAL_CAPSULE | Freq: Two times a day (BID) | ORAL | Status: DC
  Administered 2022-06-02 – 2022-06-03 (×3): 100 mg via ORAL
  Filled 2022-06-01 (×4): qty 1

## 2022-06-01 MED ORDER — ROCURONIUM BROMIDE 10 MG/ML (PF) SYRINGE
PREFILLED_SYRINGE | INTRAVENOUS | Status: DC | PRN
  Administered 2022-06-01: 80 mg via INTRAVENOUS
  Administered 2022-06-01 (×2): 20 mg via INTRAVENOUS

## 2022-06-01 MED ORDER — HYDROMORPHONE HCL 1 MG/ML IJ SOLN: 0.2500 mg | INTRAMUSCULAR | Status: DC | PRN

## 2022-06-01 MED ORDER — BUPIVACAINE HCL (PF) 0.25 % IJ SOLN
INTRAMUSCULAR | Status: AC
  Filled 2022-06-01: qty 30

## 2022-06-01 MED ORDER — INSULIN ASPART 100 UNIT/ML IJ SOLN
0.0000 [IU] | INTRAMUSCULAR | Status: DC | PRN
  Administered 2022-06-01: 4 [IU] via SUBCUTANEOUS
  Filled 2022-06-01: qty 1

## 2022-06-01 MED ORDER — BUPIVACAINE HCL (PF) 0.25 % IJ SOLN
INTRAMUSCULAR | Status: DC | PRN
  Administered 2022-06-01: 12 mL

## 2022-06-01 MED ORDER — METOPROLOL TARTRATE 50 MG PO TABS
ORAL_TABLET | ORAL | Status: AC
  Administered 2022-06-01: 50 mg via ORAL
  Filled 2022-06-01: qty 1

## 2022-06-01 MED ORDER — METHOCARBAMOL 1000 MG/10ML IJ SOLN: 500.0000 mg | Freq: Three times a day (TID) | INTRAVENOUS | Status: DC | PRN

## 2022-06-01 MED ORDER — MIDAZOLAM HCL 2 MG/2ML IJ SOLN
INTRAMUSCULAR | Status: AC
  Filled 2022-06-01: qty 2

## 2022-06-01 MED ORDER — ONDANSETRON HCL 4 MG/2ML IJ SOLN
INTRAMUSCULAR | Status: AC
  Filled 2022-06-01: qty 2

## 2022-06-01 MED ORDER — ONDANSETRON 4 MG PO TBDP: 4.0000 mg | ORAL_TABLET | Freq: Four times a day (QID) | ORAL | Status: DC | PRN

## 2022-06-01 MED ORDER — METHOCARBAMOL 500 MG PO TABS: 500.0000 mg | ORAL_TABLET | Freq: Three times a day (TID) | ORAL | Status: DC | PRN

## 2022-06-01 MED ORDER — 0.9 % SODIUM CHLORIDE (POUR BTL) OPTIME
TOPICAL | Status: DC | PRN
  Administered 2022-06-01: 2000 mL

## 2022-06-01 MED ORDER — SODIUM CHLORIDE 0.9 % IR SOLN
Status: DC | PRN
  Administered 2022-06-01: 1000 mL

## 2022-06-01 MED ORDER — AMLODIPINE BESYLATE 10 MG PO TABS
10.0000 mg | ORAL_TABLET | Freq: Every day | ORAL | Status: DC
  Administered 2022-06-02 – 2022-06-03 (×2): 10 mg via ORAL
  Filled 2022-06-01 (×2): qty 1

## 2022-06-01 MED ORDER — ROCURONIUM BROMIDE 10 MG/ML (PF) SYRINGE
PREFILLED_SYRINGE | INTRAVENOUS | Status: AC
  Filled 2022-06-01: qty 10

## 2022-06-01 MED ORDER — MORPHINE SULFATE (PF) 4 MG/ML IV SOLN
4.0000 mg | Freq: Once | INTRAVENOUS | Status: AC
  Administered 2022-06-01: 4 mg via INTRAVENOUS
  Filled 2022-06-01: qty 1

## 2022-06-01 MED ORDER — MEPERIDINE HCL 25 MG/ML IJ SOLN: 6.2500 mg | INTRAMUSCULAR | Status: DC | PRN

## 2022-06-01 MED ORDER — DEXAMETHASONE SODIUM PHOSPHATE 10 MG/ML IJ SOLN
INTRAMUSCULAR | Status: DC | PRN
  Administered 2022-06-01: 4 mg via INTRAVENOUS

## 2022-06-01 MED ORDER — PHENYLEPHRINE 80 MCG/ML (10ML) SYRINGE FOR IV PUSH (FOR BLOOD PRESSURE SUPPORT)
PREFILLED_SYRINGE | INTRAVENOUS | Status: AC
  Filled 2022-06-01: qty 10

## 2022-06-01 MED ORDER — INSULIN ASPART 100 UNIT/ML IJ SOLN
INTRAMUSCULAR | Status: DC | PRN
  Administered 2022-06-01: 4 [IU] via SUBCUTANEOUS

## 2022-06-01 MED ORDER — ACETAMINOPHEN 650 MG RE SUPP: 650.0000 mg | Freq: Four times a day (QID) | RECTAL | Status: DC | PRN

## 2022-06-01 MED ORDER — PHENYLEPHRINE 80 MCG/ML (10ML) SYRINGE FOR IV PUSH (FOR BLOOD PRESSURE SUPPORT)
PREFILLED_SYRINGE | INTRAVENOUS | Status: DC | PRN
  Administered 2022-06-01: 160 ug via INTRAVENOUS
  Administered 2022-06-01: 80 ug via INTRAVENOUS
  Administered 2022-06-01 (×2): 160 ug via INTRAVENOUS

## 2022-06-01 MED ORDER — HYDRALAZINE HCL 20 MG/ML IJ SOLN: 10.0000 mg | INTRAMUSCULAR | Status: DC | PRN

## 2022-06-01 MED ORDER — SODIUM CHLORIDE 0.9 % IV SOLN: 2.0000 g | INTRAVENOUS | Status: DC

## 2022-06-01 MED ORDER — ENOXAPARIN SODIUM 40 MG/0.4ML IJ SOSY
40.0000 mg | PREFILLED_SYRINGE | INTRAMUSCULAR | Status: DC
  Administered 2022-06-02 – 2022-06-03 (×2): 40 mg via SUBCUTANEOUS
  Filled 2022-06-01 (×2): qty 0.4

## 2022-06-01 MED ORDER — SUGAMMADEX SODIUM 200 MG/2ML IV SOLN
INTRAVENOUS | Status: DC | PRN
  Administered 2022-06-01: 250 mg via INTRAVENOUS

## 2022-06-01 MED ORDER — MORPHINE SULFATE (PF) 2 MG/ML IV SOLN
2.0000 mg | INTRAVENOUS | Status: DC | PRN
  Administered 2022-06-01: 4 mg via INTRAVENOUS
  Filled 2022-06-01: qty 2

## 2022-06-01 SURGICAL SUPPLY — 52 items
APPLIER CLIP ROT 10 11.4 M/L (STAPLE) ×2
BAG COUNTER SPONGE SURGICOUNT (BAG) ×2 IMPLANT
BENZOIN TINCTURE PRP APPL 2/3 (GAUZE/BANDAGES/DRESSINGS) ×2 IMPLANT
BIOPATCH RED 1 DISK 7.0 (GAUZE/BANDAGES/DRESSINGS) ×1 IMPLANT
BLADE CLIPPER SURG (BLADE) IMPLANT
CANISTER SUCT 3000ML PPV (MISCELLANEOUS) ×2 IMPLANT
CHLORAPREP W/TINT 26 (MISCELLANEOUS) ×2 IMPLANT
CLIP APPLIE ROT 10 11.4 M/L (STAPLE) ×2 IMPLANT
CLSR STERI-STRIP ANTIMIC 1/2X4 (GAUZE/BANDAGES/DRESSINGS) ×1 IMPLANT
COVER MAYO STAND STRL (DRAPES) ×2 IMPLANT
COVER SURGICAL LIGHT HANDLE (MISCELLANEOUS) ×2 IMPLANT
DRAIN CHANNEL 19F RND (DRAIN) ×1 IMPLANT
DRAPE C-ARM 42X120 X-RAY (DRAPES) ×2 IMPLANT
DRSG TEGADERM 2-3/8X2-3/4 SM (GAUZE/BANDAGES/DRESSINGS) ×7 IMPLANT
DRSG TEGADERM 4X4.75 (GAUZE/BANDAGES/DRESSINGS) ×2 IMPLANT
ELECT REM PT RETURN 9FT ADLT (ELECTROSURGICAL) ×2
ELECTRODE REM PT RTRN 9FT ADLT (ELECTROSURGICAL) ×2 IMPLANT
EVACUATOR SILICONE 100CC (DRAIN) ×1 IMPLANT
GAUZE SPONGE 2X2 8PLY NS (GAUZE/BANDAGES/DRESSINGS) ×1 IMPLANT
GAUZE SPONGE 2X2 8PLY STRL LF (GAUZE/BANDAGES/DRESSINGS) ×2 IMPLANT
GLOVE BIO SURGEON STRL SZ 6.5 (GLOVE) ×1 IMPLANT
GLOVE BIO SURGEON STRL SZ7 (GLOVE) ×2 IMPLANT
GLOVE BIOGEL PI IND STRL 6 (GLOVE) ×1 IMPLANT
GLOVE BIOGEL PI IND STRL 7.5 (GLOVE) ×2 IMPLANT
GOWN STRL REUS W/ TWL LRG LVL3 (GOWN DISPOSABLE) ×6 IMPLANT
GOWN STRL REUS W/TWL LRG LVL3 (GOWN DISPOSABLE) ×6
IRRIG SUCT STRYKERFLOW 2 WTIP (MISCELLANEOUS) ×2
IRRIGATION SUCT STRKRFLW 2 WTP (MISCELLANEOUS) ×2 IMPLANT
KIT BASIN OR (CUSTOM PROCEDURE TRAY) ×2 IMPLANT
KIT TURNOVER KIT B (KITS) ×2 IMPLANT
NS IRRIG 1000ML POUR BTL (IV SOLUTION) ×2 IMPLANT
PAD ARMBOARD 7.5X6 YLW CONV (MISCELLANEOUS) ×2 IMPLANT
POUCH RETRIEVAL ECOSAC 10 (ENDOMECHANICALS) ×2 IMPLANT
POUCH RETRIEVAL ECOSAC 10MM (ENDOMECHANICALS) ×4
SCISSORS LAP 5X35 DISP (ENDOMECHANICALS) ×2 IMPLANT
SET CHOLANGIOGRAPH 5 50 .035 (SET/KITS/TRAYS/PACK) ×2 IMPLANT
SET TUBE SMOKE EVAC HIGH FLOW (TUBING) ×2 IMPLANT
SLEEVE Z-THREAD 5X100MM (TROCAR) ×3 IMPLANT
SPECIMEN JAR SMALL (MISCELLANEOUS) ×2 IMPLANT
STRIP CLOSURE SKIN 1/2X4 (GAUZE/BANDAGES/DRESSINGS) ×2 IMPLANT
SUT ETHILON 2 0 FS 18 (SUTURE) ×1 IMPLANT
SUT MNCRL AB 4-0 PS2 18 (SUTURE) ×2 IMPLANT
SUT VICRYL 0 UR6 27IN ABS (SUTURE) ×1 IMPLANT
SYS BAG RETRIEVAL 10MM (BASKET) ×2
SYSTEM BAG RETRIEVAL 10MM (BASKET) ×1 IMPLANT
TOWEL GREEN STERILE (TOWEL DISPOSABLE) ×2 IMPLANT
TOWEL GREEN STERILE FF (TOWEL DISPOSABLE) ×2 IMPLANT
TRAY LAPAROSCOPIC MC (CUSTOM PROCEDURE TRAY) ×2 IMPLANT
TROCAR 11X100 Z THREAD (TROCAR) ×2 IMPLANT
TROCAR BALLN 12MMX100 BLUNT (TROCAR) ×2 IMPLANT
TROCAR Z-THREAD OPTICAL 5X100M (TROCAR) ×2 IMPLANT
WATER STERILE IRR 1000ML POUR (IV SOLUTION) ×2 IMPLANT

## 2022-06-01 NOTE — Transfer of Care (Signed)
Immediate Anesthesia Transfer of Care Note  Patient: Deborah Mcclain  Procedure(s) Performed: LAPAROSCOPIC CHOLECYSTECTOMY (Abdomen)  Patient Location: PACU  Anesthesia Type:General  Level of Consciousness: drowsy and patient cooperative  Airway & Oxygen Therapy: Patient Spontanous Breathing and Patient connected to face mask oxygen  Post-op Assessment: Report given to RN, Post -op Vital signs reviewed and stable, and Patient moving all extremities  Post vital signs: Reviewed and stable  Last Vitals:  Vitals Value Taken Time  BP 100/65 06/01/22 1730  Temp    Pulse 77 06/01/22 1739  Resp 15 06/01/22 1739  SpO2 100 % 06/01/22 1739  Vitals shown include unvalidated device data.  Last Pain:  Vitals:   06/01/22 1410  TempSrc:   PainSc: 5       Patients Stated Pain Goal: 3 ([id number])  Complications: No notable events documented.

## 2022-06-01 NOTE — ED Provider Notes (Addendum)
[facility] Provider Note   [name]: [id number] Arrival date & time: 06/01/22  [id number]     History  Chief Complaint  Patient presents with   Abdominal Pain    Deborah Mcclain is a 52 y.o. female.  The history is provided by the patient and medical records. No language interpreter was used.  Abdominal Pain    52 year old female significant history of diabetes, hypertension, asthma, cholelithiasis who presents with complaint of abdominal pain.  Patient reported she has had pain to the right side of the abdomen for nearly a month.  She was initially was evaluated and states she was told that she has a inflamed gallbladder.  She was previously seen by her GI specialist a week ago for complaints and was told that she would need to have gallbladder removed by a surgeon.  She has an upcoming appointment for consultation from general surgery in April.  She mention she still having lingering pain to the right side of her abdomen and she was seen at urgent care center on 3/7, was diagnosed with low back pain with sciatica for which she was prescribed anti-inflammatory medication muscle relaxant.  She took medication but noticed no significant improvement of her symptoms.  She went back to urgent care center yesterday for complaint but was told to come to the ER for further assessment.  She describes her pain as a sharp stabbing sensation sometimes affecting her right side abdomen sometimes affecting her back seems to be worse with certain positional change.  She denies any significant pain with eating, maybe some mild pain with spicy food.  She does not endorse any fever or chills no nausea vomiting or diarrhea no dysuria or hematuria.  She did not take any pain medication today.  Increasing pain with movement.  Home Medications Prior to Admission medications   Medication Sig Start Date End Date Taking? Authorizing Provider  albuterol (VENTOLIN HFA) 108 (90 Base)  MCG/ACT inhaler INHALE 1 TO 2 PUFFS BY MOUTH EVERY 4 HOURS AS NEEDED 07/01/19   [provider]  amLODipine (NORVASC) 10 MG tablet Take 10 mg by mouth daily. 07/12/21   [provider]  atorvastatin (LIPITOR) 40 MG tablet Take 1 tablet (40 mg total) by mouth at bedtime. 01/12/20   Ghimire, Henreitta Leber, MD  cetirizine (ZYRTEC) 10 MG tablet Take 1 tablet (10 mg total) by mouth daily. 01/16/18   Loura Halt A, NP  cyclobenzaprine (FLEXERIL) 10 MG tablet Take 1 tablet (10 mg total) by mouth 2 (two) times daily as needed for muscle spasms. 05/19/22   Barrett Henle, MD  fluticasone (FLONASE) 50 MCG/ACT nasal spray Place 1 spray into both nostrils daily for 3 days. 12/03/21 12/06/21  Teodora Medici, FNP  insulin aspart (NOVOLOG) 100 UNIT/ML FlexPen 0-20 Units, Subcutaneous, 3 times daily with meals CBG < 70: Implement Hypoglycemia measures, call MD CBG 70 - 120: 0 units CBG 121 - 150: 3 units CBG 151 - 200: 4 units CBG 201 - 250: 7 units CBG 251 - 300: 11 units CBG 301 - 350: 15 units CBG 351 - 400: 20 units CBG > 400: call MD 06/30/20   Scot Jun, NP  Insulin Pen Needle 32G X 4 MM MISC 1 each by Does not apply route as needed. 01/12/20   Ghimire, Henreitta Leber, MD  ketorolac (TORADOL) 10 MG tablet Take 1 tablet (10 mg total) by mouth every 6 (six) hours as needed (pain). 05/19/22  Barrett Henle, MD  LANTUS SOLOSTAR 100 UNIT/ML Solostar Pen 46 units in am and 36 units in pm 12/03/21   Teodora Medici, FNP  metFORMIN (GLUCOPHAGE) 1000 MG tablet Take 1,000 mg by mouth 2 (two) times daily with a meal.     [provider]  metoprolol tartrate (LOPRESSOR) 50 MG tablet Take 1 tablet (50 mg total) by mouth 2 (two) times daily. 01/12/20   Ghimire, Henreitta Leber, MD  pregabalin (LYRICA) 25 MG capsule Take 25 mg by mouth 2 (two) times daily.    [provider]  triamcinolone cream (KENALOG) 0.1 % Apply 1 application topically 2 (two) times daily. Patient not taking: Reported on 08/17/2021  07/24/20   Wieters, Hallie C, PA-C  venlafaxine XR (EFFEXOR-XR) 75 MG 24 hr capsule Take 1 capsule (75 mg total) by mouth daily. Patient not taking: Reported on 08/17/2021 12/26/19   Elvera Maria, CNM      Allergies    Other    Review of Systems   Review of Systems  Gastrointestinal:  Positive for abdominal pain.  All other systems reviewed and are negative.   Physical Exam Updated Vital Signs BP (!) 142/87   Pulse 85   Temp 99.3 F (37.4 C) (Oral)   Resp 18   Ht 5\' 4"  (1.626 m)   Wt 122.5 kg   LMP 01/31/2020 (Approximate)   SpO2 100%   BMI 46.35 kg/m  Physical Exam Vitals and nursing note reviewed.  Constitutional:      General: She is not in acute distress.    Appearance: She is well-developed. She is obese.  HENT:     Head: Atraumatic.  Eyes:     Conjunctiva/sclera: Conjunctivae normal.  Cardiovascular:     Rate and Rhythm: Normal rate and regular rhythm.  Pulmonary:     Effort: Pulmonary effort is normal.  Abdominal:     Palpations: Abdomen is soft.     Tenderness: There is generalized abdominal tenderness.     Hernia: No hernia is present.  Musculoskeletal:     Cervical back: Neck supple.  Skin:    Findings: No rash.  Neurological:     Mental Status: She is alert.  Psychiatric:        Mood and Affect: Mood normal.     ED Results / Procedures / Treatments   Labs (all labs ordered are listed, but only abnormal results are displayed) Labs Reviewed  CBC WITH DIFFERENTIAL/PLATELET - Abnormal; Notable for the following components:      Result Value   WBC 11.3 (*)    All other components within normal limits  COMPREHENSIVE METABOLIC PANEL - Abnormal; Notable for the following components:   CO2 21 (*)    Glucose, Bld 267 (*)    AST 59 (*)    ALT 70 (*)    Alkaline Phosphatase 138 (*)    All other components within normal limits  URINALYSIS, ROUTINE W REFLEX MICROSCOPIC - Abnormal; Notable for the following components:   APPearance CLOUDY (*)     Glucose, UA >=500 (*)    Hgb urine dipstick SMALL (*)    Protein, ur 100 (*)    Leukocytes,Ua LARGE (*)    Bacteria, UA MANY (*)    All other components within normal limits  LIPASE, BLOOD    EKG None  Radiology US Abdomen Limited  Result Date: 06/01/2022 CLINICAL DATA:  Abdominal pain for 1 month EXAM: ULTRASOUND ABDOMEN LIMITED RIGHT UPPER QUADRANT COMPARISON:  04/13/2022 abdomen  ultrasound FINDINGS: Gallbladder: The gallbladder is contracted, limiting evaluation. Gallstones are noted within the gallbladder, the largest of which measures up to 10 mm. The gallbladder wall measures up to 5 mm, previously 7 mm. No sonographic Murphy sign noted by sonographer. Common bile duct: Diameter: 8 mm, unchanged from the prior exam. Liver: Diffuse, increased echogenicity throughout the liver, which is heterogeneous and somewhat difficult to visualize. No focal mass is identified. Portal vein is patent on color Doppler imaging with normal direction of blood flow towards the liver. Other: Evaluation is limited by body habitus. IMPRESSION: 1. Evaluation is somewhat limited due to body habitus. Within this limitation, there is redemonstrated gallbladder wall thickening, with cholelithiasis but no sonographic Murphy sign. If there is persistent concern for cholecystitis, consider HIDA scan. 2. The common bile duct is mildly dilated measuring 8 mm, unchanged from the prior exam. Consider ERCP or MRCP for better evaluation, as biliary obstruction cannot be excluded. 3. Diffuse increased echogenicity throughout the liver, which is nonspecific and can be seen in setting of hepatic steatosis or other underlying intrinsic liver disease. Electronically Signed   By: Merilyn Baba M.D.   On: 06/01/2022 11:11    Procedures Procedures    Medications Ordered in ED Medications  morphine (PF) 4 MG/ML injection 4 mg (4 mg Intravenous Given 06/01/22 0945)  cefTRIAXone (ROCEPHIN) 1 g in sodium chloride 0.9 % 100 mL IVPB (0  g Intravenous Stopped 06/01/22 1225)    ED Course/ Medical Decision Making/ A&P                             Medical Decision Making Amount and/or Complexity of Data Reviewed Labs: ordered. Radiology: ordered.  Risk Prescription drug management. Decision regarding hospitalization.   BP (!) 142/87   Pulse 85   Temp 99.3 F (37.4 C) (Oral)   Resp 18   Ht 5\' 4"  (1.626 m)   Wt 122.5 kg   LMP 01/31/2020 (Approximate)   SpO2 100%   BMI 46.35 kg/m   62:52 AM 52 year old female significant history of diabetes, hypertension, asthma, cholelithiasis who presents with complaint of abdominal pain.  Patient reported she has had pain to the right side of the abdomen for nearly a month.  She was initially was evaluated and states she was told that she has a inflamed gallbladder.  She was previously seen by her GI specialist a week ago for complaints and was told that she would need to have gallbladder removed by a surgeon.  She has an upcoming appointment for consultation from general surgery in April.  She mention she still having lingering pain to the right side of her abdomen and she was seen at urgent care center on 3/7, was diagnosed with low back pain with sciatica for which she was prescribed anti-inflammatory medication muscle relaxant.  She took medication but noticed no significant improvement of her symptoms.  She went back to urgent care center yesterday for complaint but was told to come to the ER for further assessment.  She describes her pain as a sharp stabbing sensation sometimes affecting her right side abdomen sometimes affecting her back seems to be worse with certain positional change.  She denies any significant pain with eating, maybe some mild pain with spicy food.  She does not endorse any fever or chills no nausea vomiting or diarrhea no dysuria or hematuria.  She did not take any pain medication today.  Increasing pain with movement.  On exam this is an obese female sitting in  chair appears to be in no acute discomfort.  Heart with normal rate and rhythm, lungs are clear to auscultation bilaterally abdomen is diffusely tender with more notable tenderness to right upper and right lower quadrant.  She also has some tenderness to palpation of her right lumbar paraspinal muscle but no significant midline spine tenderness.  Exam is limited due to large body habitus.  Vital signs notable for mildly elevated blood pressure of 142/87.  Patient is afebrile no hypoxia.  -Labs ordered, independently viewed and interpreted by me.  Labs remarkable for WBC 11.3, other labs are pending -The patient was maintained on a cardiac monitor.  I personally viewed and interpreted the cardiac monitored which showed an underlying rhythm of: NSR -Imaging independently viewed and interpreted by me and I agree with radiologist's interpretation.  Result remarkable for limited abd [place] result currently pending -This patient presents to the ED for concern of abd pain, this involves an extensive number of treatment options, and is a complaint that carries with it a high risk of complications and morbidity.  The differential diagnosis includes cholecystitis, appendicitis, colitis, gastroenteritis, GERD, kidney stone, pyelonephritis, MSK pain -Co morbidities that complicate the patient evaluation includes cholelithiasis -Treatment includes morphine -Reevaluation of the patient after these medicines showed that the patient improved -PCP office notes or outside notes reviewed -Patient sign out to Dr. Johnney Killian  11:29 AM Labs remarkable for urinalysis that shows cloudy appearance, large leukocyte Estrace and many bacteria as well as greater than 50 WBC concerning for UTI.  Will treat with Rocephin.  Elevated white count of 11.3.  Mild transaminitis with AST 59, ALT 70, alk phos 138.  Normal lipase.  Limited abdominal ultrasound showing evidence of gallbladder wall thickening with cholelithiasis but no sonographic  Murphy sign.  The common bile duct is mildly dilated measuring 8 mm but unchanged from prior.  11:59 AM Appreciate consultation from general surgery team who will evaluate patient and determine disposition.  Anticipate hospital admission for further workup.  Suspect symptom is due to choledocholithiasis vs. cholecystitis       Final Clinical Impression(s) / ED Diagnoses Final diagnoses:  Acute cholecystitis    Rx / DC Orders ED Discharge Orders     None         Domenic Moras, PA-C 06/01/22 1033    Domenic Moras, PA-C 06/01/22 1335    Charlesetta Shanks, MD 06/01/22 1650

## 2022-06-01 NOTE — Anesthesia Procedure Notes (Signed)
Procedure Name: Intubation Date/Time: 06/01/2022 2:47 PM  Performed by: Reggie Pile, CRNAPre-anesthesia Checklist: Patient identified, Emergency Drugs available, Suction available and Patient being monitored Patient Re-evaluated:Patient Re-evaluated prior to induction Oxygen Delivery Method: Circle system utilized Preoxygenation: Pre-oxygenation with 100% oxygen Induction Type: IV induction Ventilation: Two handed mask ventilation required Laryngoscope Size: Glidescope (lopro 3) Grade View: Grade I Tube type: Oral Tube size: 7.0 mm Number of attempts: 1 Airway Equipment and Method: Oral airway and Rigid stylet Placement Confirmation: ETT inserted through vocal cords under direct vision, positive ETCO2 and breath sounds checked- equal and bilateral Secured at: 22 cm Tube secured with: Tape Dental Injury: Teeth and Oropharynx as per pre-operative assessment

## 2022-06-01 NOTE — Anesthesia Preprocedure Evaluation (Addendum)
Anesthesia Evaluation  Patient identified by MRN, date of birth, ID band Patient awake    Reviewed: Allergy & Precautions, NPO status , Patient's Chart, lab work & pertinent test results, reviewed documented beta blocker date and time   Airway Mallampati: III  TM Distance: >3 FB Neck ROM: Full    Dental  (+) Dental Advisory Given, Teeth Intact   Pulmonary asthma    Pulmonary exam normal breath sounds clear to auscultation       Cardiovascular hypertension, Pt. on medications and Pt. on home beta blockers Normal cardiovascular exam Rhythm:Regular Rate:Normal  Echo 12/2019  1. Left ventricular ejection fraction, by estimation, is 65 to 70%. Left ventricular ejection fraction by 2D MOD biplane is 68.0 %. The left ventricle has normal function. The left ventricle has no regional wall motion abnormalities. There is mild concentric left ventricular hypertrophy. Left ventricular diastolic parameters were normal.   2. Right ventricular systolic function is normal. The right ventricular size is normal.   3. The mitral valve is grossly normal. No evidence of mitral valve regurgitation. No evidence of mitral stenosis.   4. The aortic valve is tricuspid. Aortic valve regurgitation is not visualized. No aortic stenosis is present.   Comparison(s): No prior Echocardiogram.     Neuro/Psych negative neurological ROS     GI/Hepatic Neg liver ROS,GERD  ,,  Endo/Other  diabetes, Poorly Controlled, Type 2  Morbid obesity  Renal/GU negative Renal ROS     Musculoskeletal negative musculoskeletal ROS (+)    Abdominal  (+) + obese  Peds  Hematology  (+) Blood dyscrasia, anemia   Anesthesia Other Findings   Reproductive/Obstetrics                             Anesthesia Physical Anesthesia Plan  ASA: 3  Anesthesia Plan: General   Post-op Pain Management: Ketamine IV* and Ofirmev IV (intra-op)*   Induction:  Intravenous  PONV Risk Score and Plan: 4 or greater and Ondansetron, Dexamethasone, Treatment may vary due to age or medical condition and Midazolam  Airway Management Planned: Oral ETT and Video Laryngoscope Planned  Additional Equipment:   Intra-op Plan:   Post-operative Plan: Extubation in OR  Informed Consent: I have reviewed the patients History and Physical, chart, labs and discussed the procedure including the risks, benefits and alternatives for the proposed anesthesia with the patient or authorized representative who has indicated his/her understanding and acceptance.     Dental advisory given  Plan Discussed with: CRNA  Anesthesia Plan Comments:         Anesthesia Quick Evaluation

## 2022-06-01 NOTE — Op Note (Signed)
Laparoscopic Cholecystectomy Procedure Note  Indications: This patient presents with symptomatic gallbladder disease and will undergo laparoscopic cholecystectomy.  Ultrasound showed multiple gallstones and mild gallbladder wall thickening.  The patient is morbidly obese and has fatty liver on ultrasound.  Pre-operative Diagnosis: Acute cholecystitis  Post-operative Diagnosis: Same  Surgeon: Maia Petties   Assistants: Dr. Reather Laurence  Anesthesia: General endotracheal anesthesia  ASA Class: 3  Operative Findings - distended, thickened gallbladder with multiple large stones.  A large 3 cm stone was impacted in the infundibulum of the gallbladder.  The cystic duct was short and the common bile duct appeared to be tented up.     Procedure Details  The patient was seen again in the Holding Room. The risks, benefits, complications, treatment options, and expected outcomes were discussed with the patient. The possibilities of reaction to medication, pulmonary aspiration, perforation of viscus, bleeding, recurrent infection, finding a normal gallbladder, the need for additional procedures, failure to diagnose a condition, the possible need to convert to an open procedure, and creating a complication requiring transfusion or operation were discussed with the patient. The likelihood of improving the patient's symptoms with return to their baseline status is good.  The patient and/or family concurred with the proposed plan, giving informed consent. The site of surgery properly noted. The patient was taken to Operating Room, identified as Deborah Mcclain and the procedure verified as Laparoscopic Cholecystectomy with Intraoperative Cholangiogram. A Time Out was held and the above information confirmed.  Prior to the induction of general anesthesia, antibiotic prophylaxis was administered. General endotracheal anesthesia was then administered and tolerated well. After the induction, the abdomen was  prepped with Chloraprep and draped in sterile fashion. The patient was positioned in the supine position.  Local anesthetic agent was injected into the skin above the umbilicus and an incision made. We dissected down to the abdominal fascia with blunt dissection.  Appendiceal retractors were used to provide exposure due to the patient's obesity.  The fascia was incised vertically and we entered the peritoneal cavity bluntly.  A pursestring suture of 0-Vicryl was placed around the fascial opening.  The Hasson cannula was inserted and secured with the stay suture.  Pneumoperitoneum was then created with CO2 and tolerated well without any adverse changes in the patient's vital signs. An 11-mm port was placed in the subxiphoid position.  Two 5-mm ports were placed in the right upper quadrant. All skin incisions were infiltrated with a local anesthetic agent before making the incision and placing the trocars.   We positioned the patient in reverse Trendelenburg, tilted slightly to the patient's left.  The gallbladder was identified with omental adhesions to the fundus. The fundus was grasped and retracted cephalad. Adhesions were lysed bluntly and with the electrocautery where indicated, taking care not to injure any adjacent organs or viscus.  There is a very large stone impacted in the infundibulum which made it impossible to grab the gallbladder retracted laterally.  We attempted to use several different instruments to try to grab this area but none of these were successful.  A 5 mm port was placed in the midline between the umbilical port and the epigastric port.  A flexible liver retractor was inserted and used to retract the viscera posteriorly to help with exposure.  We still had a lot of difficulty and I am not certain what the right answer identifying the cystic duct the cystic artery.  Finally I made the decision to open the gallbladder in the midportion and  try to evacuate the stones.  Cautery was used to  cut a hole in the center of the gallbladder.  We inserted an Eco sac into the abdomen and placed this underneath the gallbladder.  We manually evacuated over 30 gallstones out of the gallbladder and placed them in the Eco sac.  We were finally able to mobilize the large stone from the infundibulum.  The Eco sac containing all of the loose gallstones was removed from the umbilical port site.  The trocar was then replaced.  The infundibulum was grasped and retracted laterally, exposing the peritoneum overlying the triangle of Calot. This was then divided and exposed in a blunt fashion. The cystic duct was clearly identified and bluntly dissected circumferentially.  The cystic duct seems to be fairly short and it appears that the common bile duct is tented up towards the gallbladder.  The cystic duct was then ligated with clips and divided.  We made sure all of our clips were above the common bile duct.  The cystic artery was, dissected free, ligated with clips and divided as well.   The gallbladder was dissected from the liver bed in retrograde fashion with the electrocautery. The gallbladder was removed and placed in an Eco sac. The liver bed was irrigated and inspected. Hemostasis was achieved with the electrocautery. Copious irrigation was utilized and was repeatedly aspirated until clear.  A 19 French drain was inserted through the epigastric port site and pulled out through the most lateral port site on the right.  This was secured with 2-0 nylon.  The gallbladder and Eco sac were then removed through the umbilical port site.  The pursestring suture was used to close the umbilical fascia.    We again inspected the right upper quadrant for hemostasis.  Pneumoperitoneum was released as we removed the trocars.  4-0 Monocryl was used to close the skin.   Benzoin, steri-strips, and clean dressings were applied. The patient was then extubated and brought to the recovery room in stable condition. Instrument, sponge,  and needle counts were correct at closure and at the conclusion of the case.   Findings: Cholecystitis with Cholelithiasis  Estimated Blood Loss: 200 mL         Drains: 19 Fr JP drain         Specimens: Gallbladder           Complications: None; patient tolerated the procedure well.         Disposition: PACU - hemodynamically stable.         Condition: stable  Deborah Burn. Georgette Dover, MD, [facility] Surgery  General Surgery   06/01/2022 5:25 PM

## 2022-06-01 NOTE — ED Triage Notes (Signed)
Pt arrived POV from home c/o right lower abdominal pain that wraps around her flank areas to her right side of her lower back. Pt went to urgent care yesterday and they told her to come to the ED she needed to have her gallbladder taken out d/t being inflamed.

## 2022-06-01 NOTE — Discharge Instructions (Addendum)
[facility]  Please arrive at least 30 min before your appointment to complete your check in paperwork.  If you are unable to arrive 30 min prior to your appointment time we may have to cancel or reschedule you. LAPAROSCOPIC SURGERY: POST OP INSTRUCTIONS Always review your discharge instruction sheet given to you by the facility where your surgery was performed. IF YOU HAVE DISABILITY OR FAMILY LEAVE FORMS, YOU MUST BRING THEM TO THE OFFICE FOR PROCESSING.   DO NOT GIVE THEM TO YOUR DOCTOR.  PAIN CONTROL  First take acetaminophen (Tylenol) AND/or ibuprofen (Advil) to control your pain after surgery.  Follow directions on package.  Taking acetaminophen (Tylenol) and/or ibuprofen (Advil) regularly after surgery will help to control your pain and lower the amount of prescription pain medication you may need.  You should not take more than 4,000 mg (4 grams) of acetaminophen (Tylenol) in 24 hours.  You should not take ibuprofen (Advil), aleve, motrin, naprosyn or other NSAIDS if you have a history of stomach ulcers or chronic kidney disease.  A prescription for pain medication may be given to you upon discharge.  Take your pain medication as prescribed, if you still have uncontrolled pain after taking acetaminophen (Tylenol) or ibuprofen (Advil). Use ice packs to help control pain. If you need a refill on your pain medication, please contact your pharmacy.  They will contact our office to request authorization. Prescriptions will not be filled after 5pm or on week-ends.  HOME MEDICATIONS Take your usually prescribed medications unless otherwise directed.  DIET You should follow a light diet the first few days after arrival home.  Be sure to include lots of fluids daily. Avoid fatty, fried foods.   CONSTIPATION It is common to experience some constipation after surgery and if you are taking pain medication.  Increasing fluid intake and taking a stool softener (such as Colace)  will usually help or prevent this problem from occurring.  A mild laxative (Milk of Magnesia or Miralax) should be taken according to package instructions if there are no bowel movements after 48 hours.  WOUND/INCISION CARE Most patients will experience some swelling and bruising in the area of the incisions.  Ice packs will help.  Swelling and bruising can take several days to resolve.  Unless discharge instructions indicate otherwise, follow guidelines below  STERI-STRIPS - you may remove your outer bandages 48 hours after surgery, and you may shower at that time.  You have steri-strips (small skin tapes) in place directly over the incision.  These strips should be left on the skin for 7-10 days.   DERMABOND/SKIN GLUE - you may shower in 24 hours.  The glue will flake off over the next 2-3 weeks. Any sutures or staples will be removed at the office during your follow-up visit.  ACTIVITIES You may resume regular (light) daily activities beginning the next day--such as daily self-care, walking, climbing stairs--gradually increasing activities as tolerated.  You may have sexual intercourse when it is comfortable.  Refrain from any heavy lifting or straining until approved by your doctor. You may drive when you are no longer taking prescription pain medication, you can comfortably wear a seatbelt, and you can safely maneuver your car and apply brakes.  FOLLOW-UP You should see your doctor in the office for a follow-up appointment approximately 2-3 weeks after your surgery.  You should have been given your post-op/follow-up appointment when your surgery was scheduled.  If you did not receive a post-op/follow-up appointment, make sure   that you call for this appointment within a day or two after you arrive home to insure a convenient appointment time.  OTHER INSTRUCTIONS  WHEN TO CALL YOUR DOCTOR: Fever over 101.0 Inability to urinate Continued bleeding from incision. Increased pain, redness, or  drainage from the incision. Increasing abdominal pain  The clinic staff is available to answer your questions during regular business hours.  Please don't hesitate to call and ask to speak to one of the nurses for clinical concerns.  If you have a medical emergency, go to the nearest emergency room or call 911.  A surgeon from [facility] is always on call at the hospital. [address], [address], [place], [place]  [address] ? [address] [address], [place], [place]   [address] [phone] ? [phone] ? FAX [phone]   

## 2022-06-01 NOTE — Inpatient Diabetes Management (Signed)
Inpatient Diabetes Program Recommendations  AACE/ADA: New Consensus Statement on Inpatient Glycemic Control (2015)  Target Ranges:  Prepandial:   less than 140 mg/dL      Peak postprandial:   less than 180 mg/dL (1-2 hours)      Critically ill patients:  140 - 180 mg/dL   Lab Results  Component Value Date   GLUCAP 201 (H) 06/01/2022   HGBA1C 14.0 (H) 01/09/2020    Review of Glycemic Control  Diabetes history: DM2 Outpatient Diabetes medications: Lantus 46 in am and 36 QHS, Novolog 7 BID, metformin 1000 mg BID Current orders for Inpatient glycemic control: Novolog 0-15 TID, 0-14 units Q2H  Need updated HgbA1C. Last one 01/09/20 was 14%  Inpatient Diabetes Program Recommendations:    Semglee 18 units QHS (starting 3/20 at 2200) Semglee 23 units QD (starting 3/21 at 0800)  When eating, add Novolog 3 units TID with meals if eating > 50%  Will follow.  Thank you. Lorenda Peck, RD, LDN, [place] Inpatient Diabetes Coordinator [phone]

## 2022-06-01 NOTE — Inpatient Diabetes Management (Signed)
Inpatient Diabetes Program Recommendations  AACE/ADA: New Consensus Statement on Inpatient Glycemic Control (2015)  Target Ranges:  Prepandial:   less than 140 mg/dL      Peak postprandial:   less than 180 mg/dL (1-2 hours)      Critically ill patients:  140 - 180 mg/dL   Lab Results  Component Value Date   GLUCAP 201 (H) 06/01/2022   HGBA1C 14.0 (H) 01/09/2020    Review of Glycemic Control  Latest Reference Range & Units 06/01/22 13:53 06/01/22 15:18  Glucose-Capillary 70 - 99 mg/dL 197 (H) 201 (H)  (H): Data is abnormally high Diabetes history: Type 2 DM Outpatient Diabetes medications: Novolog 0-20 units TID, Lantus 46 units QA, 36 units QP, Metformin 1000 mg BID Current orders for Inpatient glycemic control: Novolog 0-14 units Q2H , Novolog 0-15 units TID, Novolog 0-5 units QHS  Decadron 4 mg x 1  Inpatient Diabetes Program Recommendations:    Consider adding Semglee 15 units BID.   Thanks, Bronson Curb, MSN, RNC-OB Diabetes Coordinator [phone] (8a-5p)

## 2022-06-01 NOTE — H&P (Signed)
Deborah Mcclain 1970-03-30  [id number].    Requesting MD: Domenic Moras, PA-C Chief Complaint/Reason for Consult: Acute Cholecystitis   HPI: Deborah Mcclain is a 52 y.o. with hx of HTN, DM2, and anxiety who presented to the ED for abdominal pain.  Having intermittent RUQ abdominal pain over the last several weeks.  No associated fever, chills, chest pain, shortness of breath, nausea, vomiting, urinary symptoms or change in bowel habits. Symptoms have become more frequent. This episode began last night and has been constant and severe prompting her visit to the ED today.   In the ED she was afebrile without tachycardia or hypotension. WBC 11.3. AST 59, ALT 70. Alk Phos, T. Bili and Lipase wnl. RUQ [place] w/ Cholelithiasis and gallbladder wall thickening. CBD 14mm. We were asked to see.   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 She has never used smokeless tobacco. She reports current alcohol use. She reports that she does not use drugs.  Allergies:  Allergies  Allergen Reactions   Other Itching    Ketchup    (Not in a hospital admission)    Physical Exam: Blood pressure 137/78, pulse 81, temperature 98.1 F (36.7 C), temperature source Oral, resp. rate 16, height 5\' 4"  (1.626 m), weight 122.5 kg, last menstrual period 01/31/2020, SpO2 99 %. General:  pleasant, WD/WN female who is laying in bed in NAD HEENT: head is normocephalic, atraumatic.  Sclera are noninjected.  Pupils equal and round.  Ears and nose without any masses or lesions.  Mouth is pink and moist. Dentition fair Heart: regular, rate, and rhythm Lungs: CTAB, no wheezes, rhonchi, or rales noted.  Respiratory effort nonlabored on room air Abd:  Soft, ND, RUQ ttp. +BS. No masses, hernias, or organomegaly MS: no BUE or BLE edema, calves soft and nontender Skin: warm and dry  Psych: A&Ox4 with an appropriate affect   Results for orders placed or performed during the hospital encounter of 06/01/22 (from the past 48 hour(s))  CBC with Differential     Status: Abnormal   Collection Time: 06/01/22  9:26 AM  Result Value Ref Range   WBC 11.3 (H) 4.0 - 10.5 K/uL   RBC 4.90 3.87 - 5.11 MIL/uL   Hemoglobin 13.9 12.0 - 15.0 g/dL   HCT 43.0 36.0 - 46.0 %   MCV 87.8 80.0 - 100.0 fL   MCH 28.4 26.0 - 34.0 pg   MCHC 32.3 30.0 - 36.0 g/dL   RDW 12.4 11.5 - 15.5 %   Platelets 368 150 - 400 K/uL   nRBC 0.0 0.0 - 0.2 %   Neutrophils Relative % 59 %   Neutro Abs 6.7 1.7 - 7.7 K/uL   Lymphocytes Relative 30 %   Lymphs Abs 3.4 0.7 -  4.0 K/uL   Monocytes Relative 6 %   Monocytes Absolute 0.6 0.1 - 1.0 K/uL   Eosinophils Relative 4 %   Eosinophils Absolute 0.4 0.0 - 0.5 K/uL   Basophils Relative 1 %   Basophils Absolute 0.1 0.0 - 0.1 K/uL   Immature Granulocytes 0 %   Abs Immature Granulocytes 0.04 0.00 - 0.07 K/uL    Comment: Performed at [facility], [place] [address] [place], [place] [address]  Comprehensive metabolic panel     Status: Abnormal   Collection Time: 06/01/22  9:26 AM  Result Value Ref Range   Sodium 135 135 - 145 mmol/L   Potassium 4.7 3.5 - 5.1 mmol/L    Comment: HEMOLYSIS AT THIS LEVEL MAY AFFECT RESULT   Chloride 100 98 - 111 mmol/L   CO2 21 (L) 22 - 32 mmol/L   Glucose, Bld 267 (H) 70 - 99 mg/dL    Comment: Glucose reference range applies only to samples  taken after fasting for at least 8 hours.   BUN 7 6 - 20 mg/dL   Creatinine, Ser 0.81 0.44 - 1.00 mg/dL   Calcium 9.6 8.9 - 10.3 mg/dL   Total Protein 7.8 6.5 - 8.1 g/dL   Albumin 3.8 3.5 - 5.0 g/dL   AST 59 (H) 15 - 41 U/L    Comment: HEMOLYSIS AT THIS LEVEL MAY AFFECT RESULT   ALT 70 (H) 0 - 44 U/L    Comment: HEMOLYSIS AT THIS LEVEL MAY AFFECT RESULT   Alkaline Phosphatase 138 (H) 38 - 126 U/L   Total Bilirubin 0.9 0.3 - 1.2 mg/dL    Comment: HEMOLYSIS AT THIS LEVEL MAY AFFECT RESULT   GFR, Estimated >60 >60 mL/min    Comment: (NOTE) Calculated using the CKD-EPI Creatinine Equation (2021)    Anion gap 14 5 - 15    Comment: Performed at [facility], [place] [address], [place], [place] [address]  Lipase, blood     Status: None   Collection Time: 06/01/22  9:26 AM  Result Value Ref Range   Lipase 31 11 - 51 U/L    Comment: Performed at [facility] [address] [place], [place] [address]  Urinalysis, Routine w reflex microscopic -Urine, Clean Catch     Status: Abnormal   Collection Time: 06/01/22  9:33 AM  Result Value Ref Range   Color, Urine YELLOW YELLOW   APPearance CLOUDY (A) CLEAR   Specific Gravity, Urine 1.016 1.005 - 1.030   pH 5.0 5.0 - 8.0   Glucose, UA >=500 (A) NEGATIVE mg/dL   Hgb urine dipstick SMALL (A) NEGATIVE   Bilirubin Urine NEGATIVE NEGATIVE   Ketones, ur NEGATIVE NEGATIVE mg/dL   Protein, ur 100 (A) NEGATIVE mg/dL   Nitrite NEGATIVE NEGATIVE   Leukocytes,Ua LARGE (A) NEGATIVE   RBC / HPF 21-50 0 - 5 RBC/hpf   WBC, UA >50 0 - 5 WBC/hpf   Bacteria, UA MANY (A) NONE SEEN   Squamous Epithelial / HPF 11-20 0 - 5 /HPF   Mucus PRESENT     Comment: Performed at [facility], [place] [address] [place], [place] [address]   US Abdomen Limited  Result Date: 06/01/2022 CLINICAL DATA:  Abdominal pain for 1 month EXAM: ULTRASOUND ABDOMEN LIMITED RIGHT UPPER QUADRANT COMPARISON:  04/13/2022 abdomen ultrasound FINDINGS: Gallbladder: The  gallbladder is contracted, limiting evaluation. Gallstones are noted within the gallbladder, the largest of which measures up to 10 mm. The gallbladder  wall measures up to 5 mm, previously 7 mm. No sonographic Murphy sign noted by sonographer. Common bile duct: Diameter: 8 mm, unchanged from the prior exam. Liver: Diffuse, increased echogenicity throughout the liver, which is heterogeneous and somewhat difficult to visualize. No focal mass is identified. Portal vein is patent on color Doppler imaging with normal direction of blood flow towards the liver. Other: Evaluation is limited by body habitus. IMPRESSION: 1. Evaluation is somewhat limited due to body habitus. Within this limitation, there is redemonstrated gallbladder wall thickening, with cholelithiasis but no sonographic Murphy sign. If there is persistent concern for cholecystitis, consider HIDA scan. 2. The common bile duct is mildly dilated measuring 8 mm, unchanged from the prior exam. Consider ERCP or MRCP for better evaluation, as biliary obstruction cannot be excluded. 3. Diffuse increased echogenicity throughout the liver, which is nonspecific and can be seen in setting of hepatic steatosis or other underlying intrinsic liver disease. Electronically Signed   By: Merilyn Baba M.D.   On: 06/01/2022 11:11    Anti-infectives (From admission, onward)    Start     Dose/Rate Route Frequency Ordered Stop   06/01/22 1130  cefTRIAXone (ROCEPHIN) 1 g in sodium chloride 0.9 % 100 mL IVPB        1 g 200 mL/hr over 30 Minutes Intravenous  Once 06/01/22 1128 06/01/22 1225       Assessment/Plan Acute Cholecystitis Patient has been seen and examined. This is a 51 y.o. female with right upper quadrant abdominal pain. Her history, labs, imaging and exam are c/w Acute Cholecystitis. WBC 11.3. AST 59, ALT 70. Alk Phos, T. Bili and Lipase wnl. RUQ [place] w/ Cholelithiasis and gallbladder wall thickening. CBD 55mm. Recommend Laparoscopic Cholecystectomy with IOC.  I have explained the procedure, risks, and aftercare of Laparoscopic cholecystectomy with IOC.  Risks include but are not limited to anesthesia (MI, CVA, death, prolonged intubation and aspiration), bleeding, infection, wound problems, hernia, bile leak, injury to common bile duct/liver/intestine, possible need for subtotal cholecystectomy or open cholecystectomy, increased risk of DVT/PE and diarrhea post op. She seems to understand and agrees to proceed.  FEN - NPO for OR, IVF VTE - SCDs ID - Rocephin Foley - None Dispo - Plan for OR today. Possible d/c from PACU. If requires admission post-op, plan observation.   HTN Asthma DM2 - if requires admission, plan SSI Anxiety  I reviewed ED provider notes, last 24 h vitals and pain scores, last 48 h intake and output, last 24 h labs and trends, and last 24 h imaging results  Margie Billet, [facility] 06/01/2022, 1:05 PM Please see Amion for pager number during day hours 7:00am-4:30pm

## 2022-06-01 NOTE — Progress Notes (Signed)
Patient received at 1845 from PACU. Received report from Annie Main, Therapist, sports. Remains a yellow MEWS. On coming nurse, aware of MEWS. Endorse admission assessment Ruby Cola, Therapist, sports. Patient resting comfortably. Call light within reach.

## 2022-06-02 ENCOUNTER — Encounter (HOSPITAL_COMMUNITY): Payer: Self-pay | Admitting: Surgery

## 2022-06-02 DIAGNOSIS — Z7984 Long term (current) use of oral hypoglycemic drugs: Secondary | ICD-10-CM | POA: Diagnosis not present

## 2022-06-02 DIAGNOSIS — Z79899 Other long term (current) drug therapy: Secondary | ICD-10-CM | POA: Diagnosis not present

## 2022-06-02 DIAGNOSIS — K8000 Calculus of gallbladder with acute cholecystitis without obstruction: Secondary | ICD-10-CM | POA: Diagnosis present

## 2022-06-02 DIAGNOSIS — K760 Fatty (change of) liver, not elsewhere classified: Secondary | ICD-10-CM | POA: Diagnosis present

## 2022-06-02 DIAGNOSIS — Z8249 Family history of ischemic heart disease and other diseases of the circulatory system: Secondary | ICD-10-CM | POA: Diagnosis not present

## 2022-06-02 DIAGNOSIS — J45909 Unspecified asthma, uncomplicated: Secondary | ICD-10-CM | POA: Diagnosis present

## 2022-06-02 DIAGNOSIS — Z91018 Allergy to other foods: Secondary | ICD-10-CM | POA: Diagnosis not present

## 2022-06-02 DIAGNOSIS — Z6841 Body Mass Index (BMI) 40.0 and over, adult: Secondary | ICD-10-CM | POA: Diagnosis not present

## 2022-06-02 DIAGNOSIS — Z794 Long term (current) use of insulin: Secondary | ICD-10-CM | POA: Diagnosis not present

## 2022-06-02 DIAGNOSIS — R7401 Elevation of levels of liver transaminase levels: Secondary | ICD-10-CM | POA: Diagnosis present

## 2022-06-02 DIAGNOSIS — F419 Anxiety disorder, unspecified: Secondary | ICD-10-CM | POA: Diagnosis present

## 2022-06-02 DIAGNOSIS — I10 Essential (primary) hypertension: Secondary | ICD-10-CM | POA: Diagnosis present

## 2022-06-02 DIAGNOSIS — Z833 Family history of diabetes mellitus: Secondary | ICD-10-CM | POA: Diagnosis not present

## 2022-06-02 DIAGNOSIS — K810 Acute cholecystitis: Secondary | ICD-10-CM | POA: Diagnosis present

## 2022-06-02 DIAGNOSIS — E119 Type 2 diabetes mellitus without complications: Secondary | ICD-10-CM | POA: Diagnosis present

## 2022-06-02 LAB — COMPREHENSIVE METABOLIC PANEL
ALT: 113 U/L — ABNORMAL HIGH (ref 0–44)
AST: 118 U/L — ABNORMAL HIGH (ref 15–41)
Albumin: 3.5 g/dL (ref 3.5–5.0)
Alkaline Phosphatase: 117 U/L (ref 38–126)
Anion gap: 11 (ref 5–15)
BUN: 12 mg/dL (ref 6–20)
CO2: 20 mmol/L — ABNORMAL LOW (ref 22–32)
Calcium: 8.9 mg/dL (ref 8.9–10.3)
Chloride: 101 mmol/L (ref 98–111)
Creatinine, Ser: 0.99 mg/dL (ref 0.44–1.00)
GFR, Estimated: >60 mL/min (ref >60)
Glucose, Bld: 281 mg/dL — ABNORMAL HIGH (ref 70–99)
Potassium: 4.2 mmol/L (ref 3.5–5.1)
Sodium: 132 mmol/L — ABNORMAL LOW (ref 135–145)
Total Bilirubin: 0.7 mg/dL (ref 0.3–1.2)
Total Protein: 7.2 g/dL (ref 6.5–8.1)

## 2022-06-02 LAB — CBC
HCT: 38.6 % (ref 36.0–46.0)
Hemoglobin: 12.9 g/dL (ref 12.0–15.0)
MCH: 28.8 pg (ref 26.0–34.0)
MCHC: 33.4 g/dL (ref 30.0–36.0)
MCV: 86.2 fL (ref 80.0–100.0)
Platelets: 359 10*3/uL (ref 150–400)
RBC: 4.48 MIL/uL (ref 3.87–5.11)
RDW: 12.4 % (ref 11.5–15.5)
WBC: 14.6 10*3/uL — ABNORMAL HIGH (ref 4.0–10.5)
nRBC: 0 % (ref 0.0–0.2)

## 2022-06-02 LAB — GLUCOSE, CAPILLARY
Glucose-Capillary: 276 mg/dL — ABNORMAL HIGH (ref 70–99)
Glucose-Capillary: 305 mg/dL — ABNORMAL HIGH (ref 70–99)
Glucose-Capillary: 255 mg/dL — ABNORMAL HIGH (ref 70–99)
Glucose-Capillary: 283 mg/dL — ABNORMAL HIGH (ref 70–99)

## 2022-06-02 MED ORDER — METFORMIN HCL 500 MG PO TABS
1000.0000 mg | ORAL_TABLET | Freq: Two times a day (BID) | ORAL | Status: DC
  Administered 2022-06-02 – 2022-06-03 (×2): 1000 mg via ORAL
  Filled 2022-06-02 (×2): qty 2

## 2022-06-02 MED ORDER — METOPROLOL TARTRATE 50 MG PO TABS
50.0000 mg | ORAL_TABLET | Freq: Two times a day (BID) | ORAL | Status: DC
  Administered 2022-06-02 – 2022-06-03 (×3): 50 mg via ORAL
  Filled 2022-06-02 (×3): qty 1

## 2022-06-02 MED ORDER — KETOROLAC TROMETHAMINE 30 MG/ML IJ SOLN
30.0000 mg | Freq: Four times a day (QID) | INTRAMUSCULAR | Status: DC
  Administered 2022-06-02 – 2022-06-03 (×4): 30 mg via INTRAVENOUS
  Filled 2022-06-02 (×4): qty 1

## 2022-06-02 MED ORDER — PANTOPRAZOLE SODIUM 40 MG PO TBEC
40.0000 mg | DELAYED_RELEASE_TABLET | Freq: Every day | ORAL | Status: DC
  Administered 2022-06-02: 40 mg via ORAL
  Filled 2022-06-02: qty 1

## 2022-06-02 MED ORDER — LORATADINE 10 MG PO TABS
10.0000 mg | ORAL_TABLET | Freq: Every day | ORAL | Status: DC
  Administered 2022-06-02 – 2022-06-03 (×2): 10 mg via ORAL
  Filled 2022-06-02 (×2): qty 1

## 2022-06-02 NOTE — Inpatient Diabetes Management (Signed)
Inpatient Diabetes Program Recommendations  AACE/ADA: New Consensus Statement on Inpatient Glycemic Control (2015)  Target Ranges:  Prepandial:   less than 140 mg/dL      Peak postprandial:   less than 180 mg/dL (1-2 hours)      Critically ill patients:  140 - 180 mg/dL   Lab Results  Component Value Date   GLUCAP 276 (H) 06/02/2022   HGBA1C 14.0 (H) 01/09/2020    Review of Glycemic Control  Diabetes history: DM2 Outpatient Diabetes medications: Lantus 46 in am and 36 QHS, Novolog 7 BID, metformin 1000 mg BID Current orders for Inpatient glycemic control: Novolog 0-15 units TID and 0-5 units QHS, Metformin 1000 mg BID   Inpatient Diabetes Program Recommendations:     Semglee 18 units QHS (starting 3/20 at 2200) Semglee 23 units QD (starting 3/21 at 0800)  Will continue to follow while inpatient.  Thank you, Reche Dixon, MSN, [place] Diabetes Coordinator Inpatient Diabetes Program [phone] (team pager from 8a-5p)

## 2022-06-02 NOTE — Anesthesia Postprocedure Evaluation (Signed)
Anesthesia Post Note  Patient: Deborah Mcclain  Procedure(s) Performed: LAPAROSCOPIC CHOLECYSTECTOMY (Abdomen)     Patient location during evaluation: PACU Anesthesia Type: General Level of consciousness: awake and alert Pain management: pain level controlled Vital Signs Assessment: post-procedure vital signs reviewed and stable Respiratory status: spontaneous breathing, nonlabored ventilation and respiratory function stable Cardiovascular status: stable and blood pressure returned to baseline Anesthetic complications: no   No notable events documented.  Last Vitals:  Vitals:   06/02/22 0442 06/02/22 0730  BP: 120/75 120/70  Pulse: 86 84  Resp: 17 17  Temp: 36.8 C 36.7 C  SpO2: 95% 99%    Last Pain:  Vitals:   06/02/22 0730  TempSrc: Oral  PainSc: 0-No pain                 Audry Pili

## 2022-06-02 NOTE — Plan of Care (Signed)

## 2022-06-02 NOTE — Progress Notes (Signed)
1 Day Post-Op   Subjective/Chief Complaint: Patient denies any nausea Hungry Right-sided abdominal pain - around drain Abd tenderness is different than before surgery Drain - non-bilious   Objective: Vital signs in last 24 hours: Temp:  [98 F (36.7 C)-99.3 F (37.4 C)] 98 F (36.7 C) (03/21 0730) Pulse Rate:  [70-89] 84 (03/21 0730) Resp:  [16-24] 17 (03/21 0730) BP: (100-148)/(64-92) 120/70 (03/21 0730) SpO2:  [94 %-100 %] 99 % (03/21 0730) Weight:  [122.5 kg-123.8 kg] 123.8 kg (03/20 1352) Last BM Date : 06/01/22  Intake/Output from previous day: 03/20 0701 - 03/21 0700 In: 2057.1 [P.O.:237; I.V.:1820.1] Out: 85 [Drains:75; Blood:10] Intake/Output this shift: No intake/output data recorded.  Obese female - NAD No scleral icterus Abd - dressings dry; obese, incisional tenderness Drain - serosanguinous output  Lab Results:  Recent Labs    06/01/22 0926 06/02/22 0341  WBC 11.3* 14.6*  HGB 13.9 12.9  HCT 43.0 38.6  PLT 368 359   BMET Recent Labs    06/01/22 0926 06/02/22 0341  NA 135 132*  K 4.7 4.2  CL 100 101  CO2 21* 20*  GLUCOSE 267* 281*  BUN 7 12  CREATININE 0.81 0.99  CALCIUM 9.6 8.9      Latest Ref Rng & Units 06/02/2022    3:41 AM 06/01/2022    9:26 AM 10/10/2020    5:30 PM  Hepatic Function  Total Protein 6.5 - 8.1 g/dL 7.2  7.8  8.2   Albumin 3.5 - 5.0 g/dL 3.5  3.8  4.0   AST 15 - 41 U/L 118  59  25   ALT 0 - 44 U/L 113  70  18   Alk Phosphatase 38 - 126 U/L 117  138  83   Total Bilirubin 0.3 - 1.2 mg/dL 0.7  0.9  0.8      Studies/Results: US Abdomen Limited  Result Date: 06/01/2022 CLINICAL DATA:  Abdominal pain for 1 month EXAM: ULTRASOUND ABDOMEN LIMITED RIGHT UPPER QUADRANT COMPARISON:  04/13/2022 abdomen ultrasound FINDINGS: Gallbladder: The gallbladder is contracted, limiting evaluation. Gallstones are noted within the gallbladder, the largest of which measures up to 10 mm. The gallbladder wall measures up to 5 mm, previously  7 mm. No sonographic Murphy sign noted by sonographer. Common bile duct: Diameter: 8 mm, unchanged from the prior exam. Liver: Diffuse, increased echogenicity throughout the liver, which is heterogeneous and somewhat difficult to visualize. No focal mass is identified. Portal vein is patent on color Doppler imaging with normal direction of blood flow towards the liver. Other: Evaluation is limited by body habitus. IMPRESSION: 1. Evaluation is somewhat limited due to body habitus. Within this limitation, there is redemonstrated gallbladder wall thickening, with cholelithiasis but no sonographic Murphy sign. If there is persistent concern for cholecystitis, consider HIDA scan. 2. The common bile duct is mildly dilated measuring 8 mm, unchanged from the prior exam. Consider ERCP or MRCP for better evaluation, as biliary obstruction cannot be excluded. 3. Diffuse increased echogenicity throughout the liver, which is nonspecific and can be seen in setting of hepatic steatosis or other underlying intrinsic liver disease. Electronically Signed   By: Merilyn Baba M.D.   On: 06/01/2022 11:11    Anti-infectives: Anti-infectives (From admission, onward)    Start     Dose/Rate Route Frequency Ordered Stop   06/02/22 1200  cefTRIAXone (ROCEPHIN) 2 g in sodium chloride 0.9 % 100 mL IVPB  Status:  Discontinued        2 g  200 mL/hr over 30 Minutes Intravenous Every 24 hours 06/01/22 1835 06/02/22 0848   06/01/22 1130  cefTRIAXone (ROCEPHIN) 1 g in sodium chloride 0.9 % 100 mL IVPB        1 g 200 mL/hr over 30 Minutes Intravenous  Once 06/01/22 1128 06/01/22 1225       Assessment/Plan: Acute Cholecystitis S/p laparoscopic cholecystectomy 06/01/22 - Payton Prinsen.  Surgery was extremely difficult due to body habitus, the large thickened gallbladder wall, and difficulty grasping the gallbladder due to the large number of gallstones packed within the gallbladder.  WBC/ transaminases up today - likely reactive T. Bili  normal Drain - non-bilious   FEN - Clears, ADAT VTE - SCDs ID - Rocephin Foley - None Dispo - Advance diet today.  Monitor for post-op ileus and bile leak.  If she continues to improve and LFT's WNL tomorrow, will d/c drain prior to discharge.  Possible D/C tomorrow Restart most of home meds   HTN Asthma DM2 - if requires admission, plan SSI Anxiety    LOS: 0 days    Maia Petties 06/02/2022

## 2022-06-02 NOTE — TOC Initial Note (Signed)
Transition of Care [facility]) - Initial/Assessment Note    Patient Details  Name: Deborah Mcclain MRN: [id number] Date of Birth: Feb 10, 1971  Transition of Care [facility]) CM/SW Contact:    Curlene Labrum, RN Phone Number: 06/02/2022, 3:43 PM  Clinical Narrative:                  Transition of Care [facility]) Screening Note   Patient Details  Name: Deborah Mcclain Date of Birth: Feb 16, 1971   Transition of Care [facility]) CM/SW Contact:    Curlene Labrum, RN Phone Number: 06/02/2022, 3:43 PM    Transition of Care Department [facility]) has reviewed patient and no TOC needs have been identified at this time.   We will continue to monitor patient advancement through interdisciplinary progression rounds. If new patient transition needs arise, please place a TOC consult.          Patient Goals and CMS Choice            Expected Discharge Plan and Services                                              Prior Living Arrangements/Services                       Activities of Daily Living Home Assistive Devices/Equipment: CBG Meter ADL Screening (condition at time of admission) Patient's cognitive ability adequate to safely complete daily activities?: Yes Is the patient deaf or have difficulty hearing?: No Does the patient have difficulty seeing, even when wearing glasses/contacts?: No Does the patient have difficulty concentrating, remembering, or making decisions?: No Patient able to express need for assistance with ADLs?: Yes Does the patient have difficulty dressing or bathing?: No Independently performs ADLs?: Yes (appropriate for developmental age) Does the patient have difficulty walking or climbing stairs?: No Weakness of Legs: None Weakness of Arms/Hands: None  Permission Sought/Granted                  Emotional Assessment              Admission diagnosis:  Acute cholecystitis [K81.0] Patient Active Problem List   Diagnosis Date  Noted   Acute cholecystitis 06/01/2022   GERD (gastroesophageal reflux disease) 02/15/2022   Mild intermittent asthma 02/15/2022   Acanthosis nigricans 10/21/2021   Allergic rhinitis 10/21/2021   Sciatica 07/12/2021   Pain due to onychomycosis of toenails of both feet 12/02/2020   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   Leucocytosis 09/10/2020   Vitamin D deficiency 09/10/2020   Hyperlipidemia 01/20/2020   Iron deficiency anemia 01/20/2020   Hyperglycemia due to diabetes mellitus ([place]) 01/09/2020   Anemia 12/03/2019   Abnormal uterine bleeding (AUB) 12/03/2019   History of blood transfusion 12/03/2019   Essential hypertension 04/16/2019   Diabetes mellitus type 2 in obese ([place]) 04/16/2019   Morbid obesity with BMI of 50.0-59.9, adult ([place]) 04/16/2019   Menopausal vasomotor syndrome 04/16/2019   PCP:  Jorge Ny, PA-C Pharmacy:   [facility] [id number] [place] [address] Phone: [phone] Fax: [phone]     Social Determinants of Health (SDOH) Social History: SDOH Screenings   Food Insecurity: No Food Insecurity (06/01/2022)  Housing: Low Risk  (06/01/2022)  Transportation Needs: No Transportation Needs (06/01/2022)  Utilities: Not At Risk (06/01/2022)  Tobacco Use: Low Risk  (06/02/2022)   SDOH Interventions:     Readmission Risk Interventions     No data to display

## 2022-06-03 LAB — COMPREHENSIVE METABOLIC PANEL
ALT: 70 U/L — ABNORMAL HIGH (ref 0–44)
AST: 55 U/L — ABNORMAL HIGH (ref 15–41)
Albumin: 3 g/dL — ABNORMAL LOW (ref 3.5–5.0)
Alkaline Phosphatase: 100 U/L (ref 38–126)
Anion gap: 7 (ref 5–15)
BUN: 13 mg/dL (ref 6–20)
CO2: 23 mmol/L (ref 22–32)
Calcium: 8.5 mg/dL — ABNORMAL LOW (ref 8.9–10.3)
Chloride: 104 mmol/L (ref 98–111)
Creatinine, Ser: 0.88 mg/dL (ref 0.44–1.00)
GFR, Estimated: >60 mL/min (ref >60)
Glucose, Bld: 254 mg/dL — ABNORMAL HIGH (ref 70–99)
Potassium: 4.1 mmol/L (ref 3.5–5.1)
Sodium: 134 mmol/L — ABNORMAL LOW (ref 135–145)
Total Bilirubin: 0.6 mg/dL (ref 0.3–1.2)
Total Protein: 6.5 g/dL (ref 6.5–8.1)

## 2022-06-03 LAB — CBC
HCT: 34.1 % — ABNORMAL LOW (ref 36.0–46.0)
Hemoglobin: 11.5 g/dL — ABNORMAL LOW (ref 12.0–15.0)
MCH: 29.3 pg (ref 26.0–34.0)
MCHC: 33.7 g/dL (ref 30.0–36.0)
MCV: 87 fL (ref 80.0–100.0)
Platelets: 298 10*3/uL (ref 150–400)
RBC: 3.92 MIL/uL (ref 3.87–5.11)
RDW: 12.8 % (ref 11.5–15.5)
WBC: 10.6 10*3/uL — ABNORMAL HIGH (ref 4.0–10.5)
nRBC: 0 % (ref 0.0–0.2)

## 2022-06-03 LAB — SURGICAL PATHOLOGY

## 2022-06-03 LAB — GLUCOSE, CAPILLARY
Glucose-Capillary: 213 mg/dL — ABNORMAL HIGH (ref 70–99)
Glucose-Capillary: 207 mg/dL — ABNORMAL HIGH (ref 70–99)

## 2022-06-03 MED ORDER — OXYCODONE HCL 5 MG PO TABS: 5.0000 mg | ORAL_TABLET | Freq: Four times a day (QID) | ORAL | 0 refills | Status: DC | PRN

## 2022-06-03 MED ORDER — SULFAMETHOXAZOLE-TRIMETHOPRIM 800-160 MG PO TABS: 1.0000 | ORAL_TABLET | Freq: Two times a day (BID) | ORAL | 0 refills | Status: AC

## 2022-06-03 MED ORDER — ACETAMINOPHEN 325 MG PO TABS: 650.0000 mg | ORAL_TABLET | Freq: Four times a day (QID) | ORAL | Status: DC

## 2022-06-03 MED ORDER — POLYETHYLENE GLYCOL 3350 17 G PO PACK: 17.0000 g | PACK | Freq: Every day | ORAL | 0 refills | Status: DC | PRN

## 2022-06-03 NOTE — TOC Transition Note (Signed)
Transition of Care [facility]) - CM/SW Discharge Note   Patient Details  Name: Trystin Etten MRN: [id number] Date of Birth: [birth year]  Transition of Care [facility]) CM/SW Contact:  Curlene Labrum, RN Phone Number: 06/03/2022, 11:35 AM   Clinical Narrative:    CM met with the patient today to assist with transportation.  The patient states that her mother can provide transportation at 2 pm today to home by car.  PCP follow up noted in the AVS and transportation resources included in the discharge instructions as well.         Patient Goals and CMS Choice      Discharge Placement                         Discharge Plan and Services Additional resources added to the After Visit Summary for                                       Social Determinants of Health (SDOH) Interventions SDOH Screenings   Food Insecurity: No Food Insecurity (06/01/2022)  Housing: Low Risk  (06/01/2022)  Transportation Needs: No Transportation Needs (06/01/2022)  Utilities: Not At Risk (06/01/2022)  Tobacco Use: Low Risk  (06/02/2022)     Readmission Risk Interventions     No data to display

## 2022-06-03 NOTE — Progress Notes (Signed)
Removed JP drain and applied dry dressing. Site looks clean, dry and intact. Patient tolerated procedure well.

## 2022-06-08 NOTE — Discharge Summary (Signed)
[facility] Discharge Summary   Patient ID: Deborah Mcclain MRN: [id number] DOB/AGE: Jul 14, 1970 52 y.o.  Admit date: 06/01/2022 Discharge date: 06/03/2022  Admitting Diagnosis: Cholecystitis   Discharge Diagnosis Patient Active Problem List   Diagnosis Date Noted   Acute cholecystitis 06/01/2022   GERD (gastroesophageal reflux disease) 02/15/2022   Mild intermittent asthma 02/15/2022   Acanthosis nigricans 10/21/2021   Allergic rhinitis 10/21/2021   Sciatica 07/12/2021   Pain due to onychomycosis of toenails of both feet 12/02/2020   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   Leucocytosis 09/10/2020   Vitamin D deficiency 09/10/2020   Hyperlipidemia 01/20/2020   Iron deficiency anemia 01/20/2020   Hyperglycemia due to diabetes mellitus ([place]) 01/09/2020   Anemia 12/03/2019   Abnormal uterine bleeding (AUB) 12/03/2019   History of blood transfusion 12/03/2019   Essential hypertension 04/16/2019   Diabetes mellitus type 2 in obese ([place]) 04/16/2019   Morbid obesity with BMI of 50.0-59.9, adult ([place]) 04/16/2019   Menopausal vasomotor syndrome 04/16/2019    Consultants None  Imaging: No results found.  Procedures Dr. Georgette Dover (06/01/22) - Laparoscopic Cholecystectomy, drain placement    Hospital Course:  52 y/o F who presented to the ED with abdominal pain.  Workup showed cholecystitis.  Patient was admitted and underwent procedure listed above.  Tolerated procedure well and was transferred to the floor.  Diet was advanced as tolerated.  On POD#1 LFTs were slightly increased so the patient was kept for observation and repeat LFTs. On POD#2 LFTs were improved and there was no evidence of bile leak in her surgical drain. On POD#2 the patient was voiding well, tolerating diet, ambulating well, pain well controlled, vital signs stable, incisions c/d/i and felt stable for discharge home.  Patient will follow up in our office as below and knows to call with questions or concerns.    I  have personally reviewed the patients medication history on the [place] controlled substance database.  Physical Exam: General:  Alert, NAD, pleasant, comfortable Abd:  Soft, ND, mild tenderness, incisions C/D/I, drain with minimal SS drainage  Allergies as of 06/03/2022       Reactions   Other Itching   Ketchup        Medication List     STOP taking these medications    ketorolac 10 MG tablet Commonly known as: TORADOL       TAKE these medications    acetaminophen 325 MG tablet Commonly known as: TYLENOL Take 2 tablets (650 mg total) by mouth every 6 (six) hours.   amLODipine 10 MG tablet Commonly known as: NORVASC Take 10 mg by mouth daily.   atorvastatin 40 MG tablet Commonly known as: LIPITOR Take 1 tablet (40 mg total) by mouth at bedtime.   cetirizine 10 MG tablet Commonly known as: ZYRTEC Take 1 tablet (10 mg total) by mouth daily. What changed: when to take this   Clobetasol Propionate 0.05 % shampoo Apply 1 Application topically every 14 (fourteen) days.   cyclobenzaprine 10 MG tablet Commonly known as: FLEXERIL Take 1 tablet (10 mg total) by mouth 2 (two) times daily as needed for muscle spasms. What changed: when to take this   fluticasone 50 MCG/ACT nasal spray Commonly known as: FLONASE Place 1 spray into both nostrils daily for 3 days. What changed: how much to take   insulin aspart 100 UNIT/ML FlexPen Commonly known as: NOVOLOG 0-20 Units, Subcutaneous, 3 times daily with meals CBG < 70: Implement Hypoglycemia measures, call MD CBG 70 - 120: 0 units  CBG 121 - 150: 3 units CBG 151 - 200: 4 units CBG 201 - 250: 7 units CBG 251 - 300: 11 units CBG 301 - 350: 15 units CBG 351 - 400: 20 units CBG > 400: call MD What changed:  how much to take how to take this when to take this additional instructions   Lantus SoloStar 100 UNIT/ML Solostar Pen Generic drug: insulin glargine 46 units in am and 36 units in pm What changed:  how much to take how  to take this when to take this additional instructions   metFORMIN 1000 MG tablet Commonly known as: GLUCOPHAGE Take 1,000 mg by mouth 2 (two) times daily with a meal.   metoprolol tartrate 50 MG tablet Commonly known as: LOPRESSOR Take 1 tablet (50 mg total) by mouth 2 (two) times daily.   oxyCODONE 5 MG immediate release tablet Commonly known as: Oxy IR/ROXICODONE Take 1 tablet (5 mg total) by mouth every 6 (six) hours as needed for moderate pain (no relieved by tylenol or advil).   polyethylene glycol 17 g packet Commonly known as: MIRALAX / GLYCOLAX Take 17 g by mouth daily as needed for mild constipation.   pregabalin 25 MG capsule Commonly known as: LYRICA Take 25 mg by mouth 2 (two) times daily.   triamcinolone cream 0.1 % Commonly known as: KENALOG Apply 1 application topically 2 (two) times daily.   venlafaxine XR 75 MG 24 hr capsule Commonly known as: EFFEXOR-XR Take 1 capsule (75 mg total) by mouth daily.       ASK your doctor about these medications    sulfamethoxazole-trimethoprim 800-160 MG tablet Commonly known as: BACTRIM DS Take 1 tablet by mouth 2 (two) times daily for 3 days. Ask about: Should I take this medication?          Follow-up Information     Maczis, Carlena Hurl, [place]. Go on 06/21/2022.   Specialty: General Surgery Why: Your appointment is 06/21/22 at 11:15 am Arrive 32min early to check in, fill out paperwork, Bring photo ID and insurance information Contact information: [address] [place] [address] [phone]         Jorge Ny, PA-C Follow up.   Specialty: Physician Assistant Why: Please schedule an appointment with your PCP in the next 7-10 days. Contact information: [place] [address] [phone]                 Signed: Obie Dredge, [facility] 06/08/2022, 2:21 PM

## 2022-08-31 ENCOUNTER — Ambulatory Visit: Payer: Medicaid Other | Admitting: Obstetrics

## 2022-09-11 ENCOUNTER — Ambulatory Visit
Admission: RE | Admit: 2022-09-11 | Discharge: 2022-09-11 | Disposition: A | Discharge status: Home / Self Care | Payer: Medicaid Other | Source: Ambulatory Visit | Attending: Internal Medicine | Admitting: Internal Medicine

## 2022-09-11 VITALS — BP 138/91 | HR 88 | Temp 98.5°F | Resp 16

## 2022-09-11 DIAGNOSIS — M5441 Lumbago with sciatica, right side: Secondary | ICD-10-CM

## 2022-09-11 MED ORDER — CYCLOBENZAPRINE HCL 5 MG PO TABS: 5.0000 mg | ORAL_TABLET | Freq: Two times a day (BID) | ORAL | 0 refills | Status: DC | PRN

## 2022-09-11 MED ORDER — KETOROLAC TROMETHAMINE 30 MG/ML IJ SOLN
30.0000 mg | Freq: Once | INTRAMUSCULAR | Status: AC
  Administered 2022-09-11: 30 mg via INTRAMUSCULAR

## 2022-09-11 NOTE — ED Triage Notes (Signed)
Pt said x 2 weeks has been having sciatic issues on her right side. Hard to walk and bend.

## 2022-09-11 NOTE — Discharge Instructions (Signed)
I have prescribed you a muscle relaxer to take as needed.  Please remember that it can make you drowsy so no driving or drinking alcohol with it.  You were also given a shot for pain today.  Do not take any ibuprofen, Advil, Aleve for at least 24 hours following injection.  Alternate ice and heat to affected areas.  Follow-up if any symptoms persist or worsen.

## 2022-09-11 NOTE — ED Provider Notes (Signed)
[name]    [name]: [id number] Arrival date & time: 09/11/22  1213      History   Chief Complaint Chief Complaint  Patient presents with   Back Pain    Sciatica flare up - Entered by patient    HPI Deborah Mcclain is a 52 y.o. female.   Patient presents with right lower back pain that radiates down her right leg that has been present for about 2 weeks.  Patient reports that she has intermittent flareups of sciatica pain and this feels similar.  She has taken Tylenol for pain with minimal improvement.  Denies urinary burning, urinary frequency, urinary or bowel incontinence, saddle anesthesia.  Denies any injury to the area.   Back Pain   Past Medical History:  Diagnosis Date   Asthma    Eczema    Hypertension    Type 2 diabetes mellitus (HCC) 12/2018    Patient Active Problem List   Diagnosis Date Noted   Acute cholecystitis 06/01/2022   GERD (gastroesophageal reflux disease) 02/15/2022   Mild intermittent asthma 02/15/2022   Acanthosis nigricans 10/21/2021   Allergic rhinitis 10/21/2021   Sciatica 07/12/2021   Pain due to onychomycosis of toenails of both feet 12/02/2020   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   Leucocytosis 09/10/2020   Vitamin D deficiency 09/10/2020   Hyperlipidemia 01/20/2020   Iron deficiency anemia 01/20/2020   Hyperglycemia due to diabetes mellitus (HCC) 01/09/2020   Anemia 12/03/2019   Abnormal uterine bleeding (AUB) 12/03/2019   History of blood transfusion 12/03/2019   Essential hypertension 04/16/2019   Diabetes mellitus type 2 in obese 04/16/2019   Morbid obesity with BMI of 50.0-59.9, adult (HCC) 04/16/2019   Menopausal vasomotor syndrome 04/16/2019    Past Surgical History:  Procedure Laterality Date   CESAREAN SECTION  05/25/1997   CESAREAN SECTION  03/30/2005   twins   CHOLECYSTECTOMY N/A 06/01/2022   Procedure: LAPAROSCOPIC CHOLECYSTECTOMY;  Surgeon: Manus Rudd, MD;  Location: [facility] OR;  Service: General;   Laterality: N/A;   ENDOMETRIAL BIOPSY  12/03/2019    OB History     Gravida  2   Para  2   Term  1   Preterm  1   AB      Living  3      SAB      IAB      Ectopic      Multiple  1   Live Births  3            Home Medications    Prior to Admission medications   Medication Sig Start Date End Date Taking? Authorizing Provider  cyclobenzaprine (FLEXERIL) 5 MG tablet Take 1 tablet (5 mg total) by mouth 2 (two) times daily as needed for muscle spasms. 09/11/22  Yes Rio Kidane, Rolly Salter E, FNP  acetaminophen (TYLENOL) 325 MG tablet Take 2 tablets (650 mg total) by mouth every 6 (six) hours. 06/03/22   Adam Phenix, PA-C  amLODipine (NORVASC) 10 MG tablet Take 10 mg by mouth daily. 07/12/21   [provider]  atorvastatin (LIPITOR) 40 MG tablet Take 1 tablet (40 mg total) by mouth at bedtime. Patient not taking: Reported on 06/01/2022 01/12/20   Maretta Bees, MD  cetirizine (ZYRTEC) 10 MG tablet Take 1 tablet (10 mg total) by mouth daily. Patient taking differently: Take 10 mg by mouth 2 (two) times daily. 01/16/18   Dahlia Byes A, NP  Clobetasol Propionate 0.05 % shampoo Apply 1 Application topically every 14 (  fourteen) days. 02/28/22   [provider]  fluticasone (FLONASE) 50 MCG/ACT nasal spray Place 1 spray into both nostrils daily for 3 days. Patient taking differently: Place 2 sprays into both nostrils daily. 12/03/21 06/01/22  Gustavus Bryant, FNP  insulin aspart (NOVOLOG) 100 UNIT/ML FlexPen 0-20 Units, Subcutaneous, 3 times daily with meals CBG < 70: Implement Hypoglycemia measures, call MD CBG 70 - 120: 0 units CBG 121 - 150: 3 units CBG 151 - 200: 4 units CBG 201 - 250: 7 units CBG 251 - 300: 11 units CBG 301 - 350: 15 units CBG 351 - 400: 20 units CBG > 400: call MD Patient taking differently: Inject 7 Units into the skin in the morning and at bedtime. 06/30/20   Bing Neighbors, NP  LANTUS SOLOSTAR 100 UNIT/ML Solostar Pen 46 units in am and 36  units in pm Patient taking differently: Inject 36-46 Units into the skin See admin instructions. Inject 46 units in the morning and 36 units at night 12/03/21   Hemlock, [place] E, [place]  metFORMIN (GLUCOPHAGE) 1000 MG tablet Take 1,000 mg by mouth 2 (two) times daily with a meal.     [provider]  metoprolol tartrate (LOPRESSOR) 50 MG tablet Take 1 tablet (50 mg total) by mouth 2 (two) times daily. 01/12/20   Ghimire, Werner Lean, MD  oxyCODONE (OXY IR/ROXICODONE) 5 MG immediate release tablet Take 1 tablet (5 mg total) by mouth every 6 (six) hours as needed for moderate pain (no relieved by tylenol or advil). 06/03/22   Adam Phenix, PA-C  polyethylene glycol (MIRALAX / GLYCOLAX) 17 g packet Take 17 g by mouth daily as needed for mild constipation. 06/03/22   Adam Phenix, PA-C  pregabalin (LYRICA) 25 MG capsule Take 25 mg by mouth 2 (two) times daily.    [provider]  triamcinolone cream (KENALOG) 0.1 % Apply 1 application topically 2 (two) times daily. Patient not taking: Reported on 08/17/2021 07/24/20   Wieters, Hallie C, PA-C  venlafaxine XR (EFFEXOR-XR) 75 MG 24 hr capsule Take 1 capsule (75 mg total) by mouth daily. 12/26/19   Leftwich-Kirby, Wilmer Floor, CNM    Family History Family History  Problem Relation Age of Onset   Breast cancer Mother    Diabetes Mother    Hypertension Mother    Diabetes Maternal Grandmother     Social History Social History   Tobacco Use   Smoking status: Never   Smokeless tobacco: Never  Vaping Use   Vaping Use: Never used  Substance Use Topics   Alcohol use: Yes    Comment: occ   Drug use: Never     Allergies   Other   Review of Systems Review of Systems Per HPI  Physical Exam Triage Vital Signs ED Triage Vitals  Enc Vitals Group     BP 09/11/22 1321 (!) 138/91     Pulse Rate 09/11/22 1321 88     Resp 09/11/22 1321 16     Temp 09/11/22 1321 98.5 F (36.9 C)     Temp Source 09/11/22 1321 Oral     SpO2  09/11/22 1321 96 %     Weight --      Height --      Head Circumference --      Peak Flow --      Pain Score 09/11/22 1320 7     Pain Loc --      Pain Edu? --  Excl. in GC? --    No data found.  Updated Vital Signs BP (!) 138/91 (BP Location: Left Arm)   Pulse 88   Temp 98.5 F (36.9 C) (Oral)   Resp 16   LMP 01/31/2020 (Approximate)   SpO2 96%   Visual Acuity Right Eye Distance:   Left Eye Distance:   Bilateral Distance:    Right Eye Near:   Left Eye Near:    Bilateral Near:     Physical Exam Constitutional:      General: She is not in acute distress.    Appearance: Normal appearance. She is not toxic-appearing or diaphoretic.  HENT:     Head: Normocephalic and atraumatic.  Eyes:     Extraocular Movements: Extraocular movements intact.     Conjunctiva/sclera: Conjunctivae normal.  Pulmonary:     Effort: Pulmonary effort is normal.  Musculoskeletal:     Comments: Patient has tenderness to palpation to right lower lumbar region that extends slightly over to the lateral side.  There is no direct spinal tenderness, crepitus, step-off noted.  No discoloration or swelling noted.  Neurological:     General: No focal deficit present.     Mental Status: She is alert and oriented to person, place, and time. Mental status is at baseline.     Deep Tendon Reflexes: Reflexes are normal and symmetric.  Psychiatric:        Mood and Affect: Mood normal.        Behavior: Behavior normal.        Thought Content: Thought content normal.        Judgment: Judgment normal.      UC Treatments / Results  Labs (all labs ordered are listed, but only abnormal results are displayed) Labs Reviewed - No data to display  EKG   Radiology No results found.  Procedures Procedures (including critical care time)  Medications Ordered in UC Medications  ketorolac (TORADOL) 30 MG/ML injection 30 mg (30 mg Intramuscular Given 09/11/22 1346)    Initial Impression / Assessment and  Plan / UC Course  I have reviewed the triage vital signs and the nursing notes.  Pertinent labs & imaging results that were available during my care of the patient were reviewed by me and considered in my medical decision making (see chart for details).     Suspect patient is having flareup of sciatica back pain.  Given no injury or direct spinal tenderness, will defer imaging.  Patient typically takes a muscle relaxer for this but reports that she longer has any left.  Flexeril prescribed patient to take as needed.  Reminded patient that it can make her drowsy so no driving or drinking alcohol with it.  IM Toradol also administered and patient has had this before.  No obvious contraindication to NSAIDs noted in patient's history.  Advised no NSAIDs for at least 24 hours following injection.  Advised follow-up if any symptoms persist or worsen.  Patient verbalized understanding and was agreeable with plan. Final Clinical Impressions(s) / UC Diagnoses   Final diagnoses:  Acute right-sided low back pain with right-sided sciatica     Discharge Instructions      I have prescribed you a muscle relaxer to take as needed.  Please remember that it can make you drowsy so no driving or drinking alcohol with it.  You were also given a shot for pain today.  Do not take any ibuprofen, Advil, Aleve for at least 24 hours following injection.  Alternate ice  and heat to affected areas.  Follow-up if any symptoms persist or worsen.    ED Prescriptions     Medication Sig Dispense Auth. Provider   cyclobenzaprine (FLEXERIL) 5 MG tablet Take 1 tablet (5 mg total) by mouth 2 (two) times daily as needed for muscle spasms. 20 tablet [place], Acie Fredrickson, [place]      PDMP not reviewed this encounter.   Gustavus Bryant, [place] 09/11/22 [phone]

## 2022-10-05 ENCOUNTER — Ambulatory Visit: Payer: Medicaid Other | Admitting: Physical Medicine and Rehabilitation

## 2022-10-11 ENCOUNTER — Emergency Department (HOSPITAL_COMMUNITY)
Admission: EM | Admit: 2022-10-11 | Discharge: 2022-10-11 | Disposition: A | Discharge status: Home / Self Care | Payer: Medicaid Other | Source: Home / Self Care | Attending: Emergency Medicine | Admitting: Emergency Medicine

## 2022-10-11 ENCOUNTER — Other Ambulatory Visit: Payer: Self-pay

## 2022-10-11 DIAGNOSIS — N12 Tubulo-interstitial nephritis, not specified as acute or chronic: Secondary | ICD-10-CM | POA: Diagnosis not present

## 2022-10-11 DIAGNOSIS — M545 Low back pain, unspecified: Secondary | ICD-10-CM | POA: Diagnosis present

## 2022-10-11 LAB — URINALYSIS, ROUTINE W REFLEX MICROSCOPIC
Bilirubin Urine: NEGATIVE
Glucose, UA: >=500 mg/dL — AB
Hgb urine dipstick: NEGATIVE
Ketones, ur: NEGATIVE mg/dL
Nitrite: NEGATIVE
Protein, ur: 30 mg/dL — AB
Specific Gravity, Urine: 1.031 — ABNORMAL HIGH (ref 1.005–1.030)
pH: 5 (ref 5.0–8.0)

## 2022-10-11 MED ORDER — SULFAMETHOXAZOLE-TRIMETHOPRIM 800-160 MG PO TABS: 1.0000 | ORAL_TABLET | Freq: Two times a day (BID) | ORAL | 0 refills | Status: AC

## 2022-10-11 MED ORDER — NAPROXEN 500 MG PO TABS
500.0000 mg | ORAL_TABLET | Freq: Once | ORAL | Status: AC
  Administered 2022-10-11: 500 mg via ORAL
  Filled 2022-10-11: qty 1

## 2022-10-11 NOTE — ED Triage Notes (Signed)
Pt reports right lower back pain that radiates down right leg. Pcp prescribed Flexeril and Naproxen but pt states has not helped.

## 2022-10-11 NOTE — ED Provider Notes (Signed)
[place] EMERGENCY DEPARTMENT AT [facility] Provider Note   [name]: [id number] Arrival date & time: 10/11/22  1121     History  Chief Complaint  Patient presents with   Sciatica    Deborah Mcclain is a 52 y.o. female.  52 y.o female with a PMH of sciatica presents to the ED with a chief complaint of right-sided sciatica that is been ongoing since 17 July.  She endorses sharp pain to the right lower back without any radiation down her leg.  She has been taking Flexeril, naproxen which was prescribed at her PCP without any improvement in symptoms.  She also reports she has some dysuria going on, has not noted any hematuria present.  Has been taking this medication without any improvement in symptoms.  Exacerbated with sitting down on her right buttocks.  No fever, no IV drug use, no cancer, no bowel or bladder complaints.  The history is provided by the patient.       Home Medications Prior to Admission medications   Medication Sig Start Date End Date Taking? Authorizing Provider  sulfamethoxazole-trimethoprim (BACTRIM DS) 800-160 MG tablet Take 1 tablet by mouth 2 (two) times daily for 14 days. 10/11/22 10/25/22 Yes Katessa Attridge, Leonie Douglas, PA-C  acetaminophen (TYLENOL) 325 MG tablet Take 2 tablets (650 mg total) by mouth every 6 (six) hours. 06/03/22   Adam Phenix, PA-C  amLODipine (NORVASC) 10 MG tablet Take 10 mg by mouth daily. 07/12/21   [provider]  atorvastatin (LIPITOR) 40 MG tablet Take 1 tablet (40 mg total) by mouth at bedtime. Patient not taking: Reported on 06/01/2022 01/12/20   Maretta Bees, MD  cetirizine (ZYRTEC) 10 MG tablet Take 1 tablet (10 mg total) by mouth daily. Patient taking differently: Take 10 mg by mouth 2 (two) times daily. 01/16/18   Dahlia Byes A, NP  Clobetasol Propionate 0.05 % shampoo Apply 1 Application topically every 14 (fourteen) days. 02/28/22   [provider]  cyclobenzaprine (FLEXERIL) 5 MG tablet Take 1 tablet (5  mg total) by mouth 2 (two) times daily as needed for muscle spasms. 09/11/22   Gustavus Bryant, FNP  fluticasone (FLONASE) 50 MCG/ACT nasal spray Place 1 spray into both nostrils daily for 3 days. Patient taking differently: Place 2 sprays into both nostrils daily. 12/03/21 06/01/22  Gustavus Bryant, FNP  insulin aspart (NOVOLOG) 100 UNIT/ML FlexPen 0-20 Units, Subcutaneous, 3 times daily with meals CBG < 70: Implement Hypoglycemia measures, call MD CBG 70 - 120: 0 units CBG 121 - 150: 3 units CBG 151 - 200: 4 units CBG 201 - 250: 7 units CBG 251 - 300: 11 units CBG 301 - 350: 15 units CBG 351 - 400: 20 units CBG > 400: call MD Patient taking differently: Inject 7 Units into the skin in the morning and at bedtime. 06/30/20   Bing Neighbors, NP  LANTUS SOLOSTAR 100 UNIT/ML Solostar Pen 46 units in am and 36 units in pm Patient taking differently: Inject 36-46 Units into the skin See admin instructions. Inject 46 units in the morning and 36 units at night 12/03/21   [place], [place], [place]  metFORMIN (GLUCOPHAGE) 1000 MG tablet Take 1,000 mg by mouth 2 (two) times daily with a meal.     [provider]  metoprolol tartrate (LOPRESSOR) 50 MG tablet Take 1 tablet (50 mg total) by mouth 2 (two) times daily. 01/12/20   Ghimire, Werner Lean, MD  oxyCODONE (OXY IR/ROXICODONE) 5 MG immediate  release tablet Take 1 tablet (5 mg total) by mouth every 6 (six) hours as needed for moderate pain (no relieved by tylenol or advil). 06/03/22   Adam Phenix, PA-C  polyethylene glycol (MIRALAX / GLYCOLAX) 17 g packet Take 17 g by mouth daily as needed for mild constipation. 06/03/22   Adam Phenix, PA-C  pregabalin (LYRICA) 25 MG capsule Take 25 mg by mouth 2 (two) times daily.    [provider]  triamcinolone cream (KENALOG) 0.1 % Apply 1 application topically 2 (two) times daily. Patient not taking: Reported on 08/17/2021 07/24/20   Wieters, Hallie C, PA-C  venlafaxine XR (EFFEXOR-XR) 75 MG 24 hr  capsule Take 1 capsule (75 mg total) by mouth daily. 12/26/19   Leftwich-Kirby, Wilmer Floor, CNM      Allergies    Other    Review of Systems   Review of Systems  Constitutional:  Negative for chills and fever.  Musculoskeletal:  Positive for back pain.    Physical Exam Updated Vital Signs BP (!) 180/116 (BP Location: Left Arm)   Pulse 90   Temp 98 F (36.7 C)   Resp 16   Ht 5\' 4"  (1.626 m)   Wt 123 kg   LMP 01/31/2020 (Approximate)   SpO2 100%   BMI 46.55 kg/m  Physical Exam Vitals and nursing note reviewed.  Constitutional:      Appearance: Normal appearance.  HENT:     Head: Normocephalic and atraumatic.     Mouth/Throat:     Mouth: Mucous membranes are moist.  Cardiovascular:     Rate and Rhythm: Normal rate.  Pulmonary:     Effort: Pulmonary effort is normal.  Abdominal:     General: Abdomen is flat.  Musculoskeletal:     Cervical back: Normal range of motion and neck supple.     Lumbar back: Tenderness present.     Comments: RLE- KF,KE 5/5 strength LLE- HF, HE 5/5 strength Normal gait. No pronator drift. No leg drop.  CN I, II and VIII not tested. CN II-XII grossly intact bilaterally.      Skin:    General: Skin is warm and dry.  Neurological:     Mental Status: She is alert and oriented to person, place, and time.     ED Results / Procedures / Treatments   Labs (all labs ordered are listed, but only abnormal results are displayed) Labs Reviewed  URINALYSIS, ROUTINE W REFLEX MICROSCOPIC - Abnormal; Notable for the following components:      Result Value   APPearance CLOUDY (*)    Specific Gravity, Urine 1.031 (*)    Glucose, UA >=500 (*)    Protein, ur 30 (*)    Leukocytes,Ua LARGE (*)    Bacteria, UA RARE (*)    All other components within normal limits  URINE CULTURE    EKG None  Radiology No results found.  Procedures Procedures    Medications Ordered in ED Medications  naproxen (NAPROSYN) tablet 500 mg (has no administration in  time range)    ED Course/ Medical Decision Making/ A&P Clinical Course as of 10/11/22 1412  Tue Oct 11, 2022  1349 Leukocytes,Ua(!): LARGE [JS]  1350 WBC, UA: 21-50 [JS]  1350 Bacteria, UA(!): RARE [JS]    Clinical Course User Index [JS] Claude Manges, PA-C  Medical Decision Making Amount and/or Complexity of Data Reviewed Labs: ordered. Decision-making details documented in ED Course.  Risk Prescription drug management.    Patient presents to the ED with a chief complaint of right-sided lumbar spine pain that is been ongoing for the past several days, saw her PCP on July 17, she was placed on muscle relaxers, anti-inflammatories without any improvement in symptoms.  She is ambulatory with a steady gait.  Exam is benign.  She is concerned as previously she thought she had sciatica, however this was more so her gallbladder, which was later removed.  She is having some urinary symptoms such as dysuria.  Has been taking her medication without any improvement in symptoms.  Urinalysis with large leukocytes, rare bacteria, 21-50 white blood cell count, she does have some right CVA tenderness, no blood present in her urine I have a lower suspicion for kidney stone at this time.  Suspect initial of pyelonephritis.  Patient does tolerate p.o. at this time, and has ordered door [employer] to the room.  She will be given naproxen to help with pain control, will go home on a short course of Bactrim to help with treatment of her pyelonephritis.  She is hemodynamically stable for discharge.   Portions of this note were generated with Scientist, clinical (histocompatibility and immunogenetics). Dictation errors may occur despite best attempts at proofreading.   Final Clinical Impression(s) / ED Diagnoses Final diagnoses:  Pyelonephritis    Rx / DC Orders ED Discharge Orders          Ordered    sulfamethoxazole-trimethoprim (BACTRIM DS) 800-160 MG tablet  2 times daily        10/11/22 1409               Claude Manges, PA-C 10/11/22 1412    Deborah Sprout, MD 10/11/22 1504

## 2022-10-11 NOTE — Discharge Instructions (Signed)
You were given a prescription to help with your likely kidney infection, please take 1 tablet twice a day for the next 14 days.  If you experience any nausea, vomiting, worsening symptoms you will need to return to the emergency department.

## 2022-10-13 ENCOUNTER — Telehealth (HOSPITAL_BASED_OUTPATIENT_CLINIC_OR_DEPARTMENT_OTHER): Payer: Self-pay | Admitting: *Deleted

## 2022-10-13 NOTE — Telephone Encounter (Signed)
Post ED Visit - Positive Culture Follow-up: Successful Patient Follow-Up  Culture assessed and recommendations reviewed by:  [x]  Sharin Mons, Pharm.D. []  Celedonio Miyamoto, [address].D., BCPS AQ-ID []  Garvin Fila, Pharm.D., BCPS []  Georgina Pillion, [address].D., BCPS []  [place], [address].D., BCPS, AAHIVP []  Estella Husk, Pharm.D., BCPS, AAHIVP []  Lysle Pearl, PharmD, BCPS []  Phillips Climes, PharmD, BCPS []  Agapito Games, PharmD, BCPS []  Verlan Friends, PharmD  Positive urine culture  []  Patient discharged without antimicrobial prescription and treatment is now indicated [x]  Organism is resistant to prescribed ED discharge antimicrobial []  Patient with positive blood cultures  Changes discussed with ED provider: Lajuana Matte Patient return call and prescription called in to [facility] on [address] [phone] for Cefadroxil 500mg  BUD x 7 day   Contacted patient, date 10/13/2022, time 1155   Bing Quarry 10/13/2022, 11:55 AM

## 2022-10-13 NOTE — Telephone Encounter (Signed)
Post ED Visit - Positive Culture Follow-up: Unsuccessful Patient Follow-up  Culture assessed and recommendations reviewed by:  []  Enzo Bi, Pharm.D. []  Celedonio Miyamoto, Pharm.D., BCPS AQ-ID []  Garvin Fila, Pharm.D., BCPS []  Georgina Pillion, Pharm.D., BCPS []  [place], [address].D., BCPS, AAHIVP []  Estella Husk, Pharm.D., BCPS, AAHIVP []  Sherlynn Carbon, PharmD []  Pollyann Samples, PharmD, BCPS  Positive urine culture  []  Patient discharged without antimicrobial prescription and treatment is now indicated [x]  Organism is resistant to prescribed ED discharge antimicrobial []  Patient with positive blood cultures   Unable to contact patient after 3 attempts, letter will be sent to address on file Left voicemail to call back Plan Stop taking Bactrim and start Cefadroxil 500mg  BID x 7 days Lajuana Matte MD   Bing Quarry 10/13/2022, 9:38 AM

## 2022-10-13 NOTE — Progress Notes (Signed)
ED Antimicrobial Stewardship Positive Culture Follow Up   Deborah Mcclain is an 52 y.o. female who presented to [facility] on 10/11/2022 with a chief complaint of  Chief Complaint  Patient presents with   Sciatica    Recent Results (from the past 720 hour(s))  Urine Culture     Status: Abnormal   Collection Time: 10/11/22 12:08 PM   Specimen: Urine, Clean Catch  Result Value Ref Range Status   Specimen Description   Final    URINE, CLEAN CATCH Performed at [facility], [address], [place], [place] [address]    Special Requests   Final    NONE Performed at [facility], [address], [place], [place] [address]    Culture (A)  Final    80,000 COLONIES/mL GROUP B STREP(S.AGALACTIAE)ISOLATED TESTING AGAINST S. AGALACTIAE NOT ROUTINELY PERFORMED DUE TO PREDICTABILITY OF AMP/PEN/VAN SUSCEPTIBILITY. Performed at [facility], [address] [place], [place] [address]    Report Status 10/12/2022 FINAL  Final    [x]  Treated with bactrim , organism resistant to prescribed antimicrobial []  Patient discharged originally without antimicrobial agent and treatment is now indicated  New antibiotic prescription: Cefadroxil 500 mg BID X 7 days   ED Provider: Alvester Chou, MD    Sharin Mons, PharmD, BCPS, BCIDP Infectious Diseases Clinical Pharmacist Phone: [phone] 10/13/2022, 8:50 AM

## 2022-11-26 ENCOUNTER — Ambulatory Visit
Admission: RE | Admit: 2022-11-26 | Discharge: 2022-11-26 | Disposition: A | Discharge status: Home / Self Care | Payer: Medicaid Other | Source: Ambulatory Visit

## 2022-11-26 VITALS — BP 115/74 | HR 85 | Temp 98.5°F | Resp 20 | Ht 64.0 in | Wt 270.0 lb

## 2022-11-26 DIAGNOSIS — M5441 Lumbago with sciatica, right side: Secondary | ICD-10-CM

## 2022-11-26 MED ORDER — CYCLOBENZAPRINE HCL 10 MG PO TABS: 10.0000 mg | ORAL_TABLET | Freq: Two times a day (BID) | ORAL | 0 refills | Status: DC | PRN

## 2022-11-26 NOTE — ED Triage Notes (Signed)
Lower back pain and  side and buttocks - Entered by patient  "History of sciatic pain on right lower side/pant line". "This is flaring up right now". No injury. No numbness "but I have tingling (normally) in the right leg/foot".

## 2022-11-29 ENCOUNTER — Encounter: Payer: Self-pay | Admitting: Physical Medicine and Rehabilitation

## 2022-11-29 ENCOUNTER — Ambulatory Visit: Payer: Medicaid Other | Admitting: Physical Medicine and Rehabilitation

## 2022-11-29 DIAGNOSIS — M47816 Spondylosis without myelopathy or radiculopathy, lumbar region: Secondary | ICD-10-CM | POA: Diagnosis not present

## 2022-11-29 DIAGNOSIS — M7918 Myalgia, other site: Secondary | ICD-10-CM | POA: Diagnosis not present

## 2022-11-29 DIAGNOSIS — M5416 Radiculopathy, lumbar region: Secondary | ICD-10-CM | POA: Diagnosis not present

## 2022-11-29 MED ORDER — MELOXICAM 15 MG PO TABS: 15.0000 mg | ORAL_TABLET | Freq: Every day | ORAL | 0 refills | Status: DC

## 2022-11-29 NOTE — Progress Notes (Signed)
Functional Pain Scale - descriptive words and definitions  Distracting (5)    Aware of pain/able to complete some ADL's but limited by pain/sleep is affected and active distractions are only slightly useful. Moderate range order  Average Pain 5  Lower back pain on right side that radiates down the right leg to the knee. Flare started about 2 weeks ago.  Was give muscle relaxer on 11/26/22 by urgent care

## 2022-11-29 NOTE — Progress Notes (Signed)
Deborah Mcclain - 52 y.o. female MRN [id number]  Date of birth: 1971/01/08  Office Visit Note: Visit Date: 11/29/2022 PCP: Quita Skye, PA-C Referred by: Quita Skye, PA-C  Subjective: Chief Complaint  Patient presents with   Lower Back - Pain   HPI: Deborah Mcclain is a 52 y.o. female who comes in today per the request of Quita Skye, [place] for evaluation of chronic, worsening and severe bilateral lower back pain radiating to buttocks and down right posterior thigh to knee. Bilateral lower back pain is most severe pain. Pain ongoing for several years, worsened over the last 2 weeks. She believes her pain becomes worse when air conditioner in her home is on, also reports severe pain with activity and movement. She describes pain as sharp and sore sensation, currently rates as 5 out of 10. Some relief of pain with home exercise regimen, rest and use of medications. Some relief with Flexeril. History of formal physical therapy with short term relief of pain. Patient evaluated multiple times at Urgent Care for same issues over the last several months. Lumbar x-rays from 2022 exhibits moderate facet arthropathy on the right at L4-L5 and L5-S1, no spondylolisthesis, well preserved disc spacing. No prior lumbar MRI imaging. No history of lumbar surgery/injections.   Patients clinical course is complicated by diabetes mellitus, polyneuropathy, morbid obesity. She reports chronic issues with uncontrolled diabetes, currently taking insulin.    Oswestry Disability Index Score 28% 10 to 20 (40%) moderate disability: The patient experiences more pain and difficulty with sitting, lifting and standing. Travel and social life are more difficult, and they may be disabled from work. Personal care, sexual activity and sleeping are not grossly affected, and the patient can usually be managed by conservative means.  Review of Systems  Musculoskeletal:  Positive for back pain and myalgias.  Neurological:   Negative for tingling, sensory change, focal weakness and weakness.  All other systems reviewed and are negative.  Otherwise per HPI.  Assessment & Plan: Visit Diagnoses:    ICD-10-CM   1. Lumbar radiculopathy  M54.16 MR LUMBAR SPINE WO CONTRAST    2. Facet arthropathy, lumbar  M47.816 MR LUMBAR SPINE WO CONTRAST    3. Myofascial pain syndrome  M79.18 MR LUMBAR SPINE WO CONTRAST    4. Morbid (severe) obesity due to excess calories (HCC)  E66.01 MR LUMBAR SPINE WO CONTRAST       Plan: Findings:  Chronic, worsening and severe  bilateral lower back pain radiating to buttocks and down right posterior thigh to knee. Bilateral lower back pain is most severe pain. Patient continues to have severe pain despite good conservative therapies such as formal physical therapy, home exercise regimen, rest and use of medications. Patients clinical presentation and exam are complex, differentials include lumbar radiculopathy, facet mediated pain and myofascial pain syndrome. Lumbar x-rays from 2022 does show moderate facet arthropathy at L4-L5 and L5-S1. Tenderness noted upon palpation of bilateral quadratus lumborum region upon exam today. I discussed treatment plan in detail with her today, next step is to obtain lumbar MRI imaging. Depending on results of MRI imaging we discussed possibility of performing lumbar injections. I discussed medication management as well and prescribed short course of Meloxicam. I will see her back for lumbar MRI review. She has no questions regarding plan. No red flag symptoms noted upon exam today.     Meds & Orders:  Meds ordered this encounter  Medications   meloxicam (MOBIC) 15 MG tablet    Sig: Take 1  tablet (15 mg total) by mouth daily.    Dispense:  30 tablet    Refill:  0    Orders Placed This Encounter  Procedures   MR LUMBAR SPINE WO CONTRAST    Follow-up: Return for Lumbar MRI review.   Procedures: No procedures performed      Clinical History: CLINICAL  DATA:  Back pain radiating into RIGHT leg, chronic back pain.   EXAM: LUMBAR SPINE - COMPLETE 4+ VIEW   COMPARISON:  None   FINDINGS: Five lumbar type vertebral bodies. Vertebral body heights and disc spaces are maintained. There is moderate facet arthropathy which is worse on the RIGHT at the L4-5 and L5-S1 levels. No sign of acute abnormality in the lumbar spine. Mild anterior osteophytes throughout the lumbar spine.   IMPRESSION: Degenerative changes in the lumbar spine as described. No acute findings     Electronically Signed   By: Donzetta Kohut M.D.   On: 10/26/2020 11:44   She reports that she has never smoked. She has never used smokeless tobacco. No results for input(s): "HGBA1C", "LABURIC" in the last 8760 hours.  Objective:  VS:  HT:    WT:   BMI:     BP:   HR: bpm  TEMP: ( )  RESP:  Physical Exam  Ortho Exam  Imaging: No results found.  Past Medical/Family/Surgical/Social History: Medications & Allergies reviewed per EMR, new medications updated. 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   Leucocytosis 09/10/2020   Vitamin D deficiency 09/10/2020   Hyperlipidemia 01/20/2020   Iron deficiency anemia 01/20/2020   Hyperglycemia due to diabetes mellitus (HCC) 01/09/2020   Anemia 12/03/2019   Abnormal uterine bleeding (AUB) 12/03/2019   History of blood transfusion 12/03/2019   Essential hypertension 04/16/2019   Diabetes mellitus type 2 in obese 04/16/2019   Morbid obesity with BMI of 50.0-59.9, adult (HCC) 04/16/2019   Menopausal vasomotor syndrome 04/16/2019   Type 2 diabetes mellitus with diabetic polyneuropathy, with long-term current use of insulin (HCC) 04/16/2019   Past Medical History:   Diagnosis Date   Acanthosis nigricans 10/21/2021   Allergic rhinitis 10/21/2021   Asthma    Eczema    Hypertension    Type 2 diabetes mellitus (HCC) 12/2018   Family History  Problem Relation Age of Onset   Breast cancer Mother    Diabetes Mother    Hypertension Mother    Diabetes Maternal Grandmother    Past Surgical History:  Procedure Laterality Date   CESAREAN SECTION  05/25/1997   CESAREAN SECTION  03/30/2005   twins   CHOLECYSTECTOMY N/A 06/01/2022   Procedure: LAPAROSCOPIC CHOLECYSTECTOMY;  Surgeon: Manus Rudd, MD;  Location: [facility] OR;  Service: General;  Laterality: N/A;   ENDOMETRIAL BIOPSY  12/03/2019   Social History   Occupational History   Not on file  Tobacco Use   Smoking status: Never   Smokeless tobacco: Never  Vaping Use   Vaping status: Never Used  Substance and Sexual Activity   Alcohol use: Yes    Comment: occ   Drug use: Never   Sexual activity: Yes    Partners: Male    Birth control/protection: Surgical

## 2022-12-01 NOTE — ED Provider Notes (Signed)
[name]    [name]: [id number] Arrival date & time: 11/26/22  1159      History   Chief Complaint Chief Complaint  Patient presents with   Back Pain    Appt    HPI Deborah Mcclain is a 52 y.o. female.   Patient here today for evaluation of right lower back pain that radiates into the buttock area.  She does have history of sciatica on the same side.  She states she feels as if she is having a flare of same.  She denies any injury.  She has not any numbness.  She has taken over-the-counter medication without resolution.  The history is provided by the patient.  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   Leucocytosis 09/10/2020   Vitamin D deficiency 09/10/2020   Hyperlipidemia 01/20/2020   Iron deficiency anemia 01/20/2020   Hyperglycemia due to diabetes mellitus (HCC) 01/09/2020   Anemia 12/03/2019   Abnormal uterine bleeding (AUB) 12/03/2019   History of blood transfusion 12/03/2019   Essential hypertension 04/16/2019   Diabetes mellitus type 2 in obese 04/16/2019   Morbid obesity with BMI of 50.0-59.9, adult (HCC) 04/16/2019   Menopausal vasomotor syndrome 04/16/2019   Type 2 diabetes mellitus with diabetic polyneuropathy, with long-term current use of insulin (HCC) 04/16/2019    Past Surgical History:  Procedure Laterality Date   CESAREAN SECTION  05/25/1997   CESAREAN SECTION  03/30/2005   twins   CHOLECYSTECTOMY N/A 06/01/2022    Procedure: LAPAROSCOPIC CHOLECYSTECTOMY;  Surgeon: Manus Rudd, MD;  Location: [facility] OR;  Service: General;  Laterality: N/A;   ENDOMETRIAL BIOPSY  12/03/2019    OB History     Gravida  2   Para  2   Term  1   Preterm  1   AB      Living  3      SAB      IAB      Ectopic      Multiple  1   Live Births  3            Home Medications    Prior to Admission medications   Medication Sig Start Date End Date Taking? Authorizing Provider  ACCU-CHEK GUIDE test strip 1 each by Other route as needed. 11/15/22  Yes [provider]  Accu-Chek Softclix Lancets lancets USE AS DIRECTED TO CHECK BLOOD SUGAR TWICE DAILY 09/23/22  Yes [provider]  amLODipine (NORVASC) 10 MG tablet Take 10 mg by mouth daily. 07/12/21  Yes [provider]  atorvastatin (LIPITOR) 40 MG tablet Take 1 tablet (40 mg total) by mouth at bedtime. 01/12/20  Yes Ghimire, Werner Lean, MD  B-D ULTRAFINE III SHORT PEN 31G X 8 MM MISC 2 (two) times daily. 09/23/22  Yes [provider]  cefadroxil (DURICEF) 500 MG capsule Take 500 mg by mouth 2 (two) times daily. 10/13/22  Yes [provider]  Clobetasol  Propionate 0.05 % shampoo Apply topically. 09/29/22  Yes [provider]  cyclobenzaprine (FLEXERIL) 10 MG tablet Take 1 tablet (10 mg total) by mouth 2 (two) times daily as needed for muscle spasms. 11/26/22  Yes Tomi Bamberger, PA-C  famotidine (PEPCID) 20 MG tablet Take 20 mg by mouth daily. 09/05/19  Yes [provider]  fluticasone (FLONASE) 50 MCG/ACT nasal spray Place 2 sprays into both nostrils daily. 06/11/19  Yes [provider]  insulin aspart (NOVOLOG) 100 UNIT/ML FlexPen 0-20 Units, Subcutaneous, 3 times daily with meals CBG < 70: Implement Hypoglycemia measures, call MD CBG 70 - 120: 0 units CBG 121 - 150: 3 units CBG 151 - 200: 4 units CBG 201 - 250: 7 units CBG 251 - 300: 11 units CBG 301 - 350: 15 units CBG 351 - 400: 20 units CBG > 400:  call MD Patient taking differently: Inject 7 Units into the skin in the morning and at bedtime. 06/30/20  Yes Bing Neighbors, NP  LANTUS SOLOSTAR 100 UNIT/ML Solostar Pen 46 units in am and 36 units in pm Patient taking differently: Inject 36-46 Units into the skin See admin instructions. Inject 46 units in the morning and 36 units at night 12/03/21  Yes [place], Ledbetter E, [place]  levocetirizine (XYZAL) 5 MG tablet Take 5 mg by mouth every evening.   Yes [provider]  metFORMIN (GLUCOPHAGE) 1000 MG tablet Take 1,000 mg by mouth 2 (two) times daily with a meal.    Yes [provider]  metFORMIN (GLUCOPHAGE) 1000 MG tablet Take 1 tablet by mouth 2 (two) times daily. 01/20/22 01/20/23 Yes [provider]  metoprolol tartrate (LOPRESSOR) 50 MG tablet Take 1 tablet (50 mg total) by mouth 2 (two) times daily. 01/12/20  Yes Ghimire, Werner Lean, MD  metoprolol tartrate (LOPRESSOR) 50 MG tablet Take 1 tablet by mouth 2 (two) times daily. 01/12/20  Yes [provider]  naproxen (NAPROSYN) 375 MG tablet Take 375 mg by mouth 3 (three) times daily with meals. 09/29/22  Yes [provider]  pregabalin (LYRICA) 25 MG capsule Take 25 mg by mouth 2 (two) times daily.   Yes [provider]  triamcinolone lotion (KENALOG) 0.1 % Apply 1 Application topically daily as needed. 10/30/22  Yes [provider]  venlafaxine XR (EFFEXOR-XR) 75 MG 24 hr capsule Take 1 capsule (75 mg total) by mouth daily. 12/26/19  Yes Leftwich-Kirby, Wilmer Floor, CNM  venlafaxine XR (EFFEXOR-XR) 75 MG 24 hr capsule Take 75 mg by mouth daily with breakfast. 08/19/19  Yes [provider]  Adapalene-Benzoyl Peroxide 0.3-2.5 % GEL Apply 1 Application topically at bedtime.    [provider]  albuterol (VENTOLIN HFA) 108 (90 Base) MCG/ACT inhaler Inhale 2 puffs into the lungs every 4 (four) hours as needed for wheezing or shortness of breath. 07/01/19   [provider]   Azelastine HCl 137 MCG/SPRAY SOLN Place 1 spray into the nose 2 (two) times daily. 07/01/19   [provider]  cetirizine (ZYRTEC) 10 MG tablet Take 1 tablet (10 mg total) by mouth daily. Patient taking differently: Take 10 mg by mouth 2 (two) times daily. 01/16/18   Dahlia Byes A, NP  Clobetasol Propionate 0.05 % shampoo Apply 1 Application topically every 14 (fourteen) days. 02/28/22   [provider]  Fluocinolone Acetonide Scalp 0.01 % OIL Apply 1 Application topically as needed (Apply twice a day on scalp. Do not need to rinse it). 10/03/19   [provider]  fluticasone (FLONASE) 50 MCG/ACT nasal spray Place 1 spray into both nostrils daily for 3 days. Patient taking differently: Place 2 sprays into both nostrils daily. 12/03/21 06/01/22  Gustavus Bryant, FNP  hydrocortisone 2.5 % cream Apply 1 Application topically 2 (two) times daily.    [provider]  KETOCONAZOLE, TOPICAL, 1 % SHAM Apply 1 Application topically as needed (Itching). 09/29/22   [provider]  meloxicam (MOBIC) 15 MG tablet Take 1 tablet (15 mg total) by mouth daily. 11/29/22 11/29/23  Juanda Chance, NP  rosuvastatin (CRESTOR) 5 MG tablet Take 5 mg by mouth daily.    [provider]  triamcinolone cream (KENALOG) 0.1 % Apply 1 application topically 2 (two) times daily. 07/24/20   Wieters, Junius Creamer, PA-C    Family History Family History  Problem Relation Age of Onset   Breast cancer Mother    Diabetes Mother    Hypertension Mother    Diabetes Maternal Grandmother     Social History Social History   Tobacco Use   Smoking status: Never   Smokeless tobacco: Never  Vaping Use   Vaping status: Never Used  Substance Use Topics   Alcohol use: Yes    Comment: occ   Drug use: Never     Allergies   Other   Review of Systems Review of Systems  Constitutional:  Negative for chills and fever.  Eyes:  Negative for discharge and redness.  Gastrointestinal:   Negative for abdominal pain, nausea and vomiting.  Musculoskeletal:  Positive for back pain and myalgias.  Neurological:  Negative for numbness.     Physical Exam Triage Vital Signs ED Triage Vitals  Encounter Vitals Group     BP 11/26/22 1234 115/74     Systolic BP Percentile --      Diastolic BP Percentile --      Pulse Rate 11/26/22 1234 85     Resp 11/26/22 1234 20     Temp 11/26/22 1234 98.5 F (36.9 C)     Temp Source 11/26/22 1234 Oral     SpO2 11/26/22 1234 97 %     Weight 11/26/22 1230 270 lb (122.5 kg)     Height 11/26/22 1230 5\' 4"  (1.626 m)     Head Circumference --      Peak Flow --      Pain Score 11/26/22 1223 6     Pain Loc --      Pain Education --      Exclude from Growth Chart --    No data found.  Updated Vital Signs BP 115/74 (BP Location: Left Arm)   Pulse 85   Temp 98.5 F (36.9 C) (Oral)   Resp 20   Ht 5\' 4"  (1.626 m)   Wt 270 lb (122.5 kg)   LMP 01/31/2020 (Approximate)   SpO2 97%   BMI 46.35 kg/m      Physical Exam Vitals and nursing note reviewed.  Constitutional:      General: She is not in acute distress.    Appearance: Normal appearance. She is not ill-appearing.  HENT:     Head: Normocephalic and atraumatic.  Eyes:     Conjunctiva/sclera: Conjunctivae normal.  Cardiovascular:     Rate and Rhythm: Normal rate.  Pulmonary:     Effort: Pulmonary effort is normal. No respiratory distress.  Musculoskeletal:     Comments: No tenderness to palpation midline spine.  Mild tenderness palpation to right lower back.  Neurological:  Mental Status: She is alert.  Psychiatric:        Mood and Affect: Mood normal.        Behavior: Behavior normal.        Thought Content: Thought content normal.      UC Treatments / Results  Labs (all labs ordered are listed, but only abnormal results are displayed) Labs Reviewed - No data to display  EKG   Radiology No results found.  Procedures Procedures (including critical care  time)  Medications Ordered in UC Medications - No data to display  Initial Impression / Assessment and Plan / UC Course  I have reviewed the triage vital signs and the nursing notes.  Pertinent labs & imaging results that were available during my care of the patient were reviewed by me and considered in my medical decision making (see chart for details).    Flexeril prescribed for suspected muscle strain with sciatica.  Advised muscle relaxer may cause drowsiness.  Steroid avoided given diabetes that is seemingly uncontrolled at this time.  Recommended follow-up if no gradual improvement with any further concerns.  Final Clinical Impressions(s) / UC Diagnoses   Final diagnoses:  Acute right-sided low back pain with right-sided sciatica   Discharge Instructions   None    ED Prescriptions     Medication Sig Dispense Auth. Provider   cyclobenzaprine (FLEXERIL) 10 MG tablet Take 1 tablet (10 mg total) by mouth 2 (two) times daily as needed for muscle spasms. 20 tablet Tomi Bamberger, PA-C      PDMP not reviewed this encounter.   Tomi Bamberger, PA-C 12/01/22 2115

## 2022-12-19 ENCOUNTER — Other Ambulatory Visit: Payer: Self-pay | Admitting: Physician Assistant

## 2022-12-19 DIAGNOSIS — Z1231 Encounter for screening mammogram for malignant neoplasm of breast: Secondary | ICD-10-CM

## 2022-12-30 ENCOUNTER — Ambulatory Visit: Payer: Medicaid Other

## 2023-01-03 ENCOUNTER — Ambulatory Visit
Admission: RE | Admit: 2023-01-03 | Discharge: 2023-01-03 | Disposition: A | Discharge status: Home / Self Care | Payer: Medicaid Other | Source: Ambulatory Visit | Attending: Physician Assistant | Admitting: Physician Assistant

## 2023-01-03 VITALS — BP 147/95 | HR 77 | Temp 98.3°F | Resp 18 | Ht 64.0 in | Wt 270.0 lb

## 2023-01-03 DIAGNOSIS — J019 Acute sinusitis, unspecified: Secondary | ICD-10-CM

## 2023-01-03 DIAGNOSIS — H65193 Other acute nonsuppurative otitis media, bilateral: Secondary | ICD-10-CM | POA: Diagnosis not present

## 2023-01-03 MED ORDER — AMOXICILLIN-POT CLAVULANATE 875-125 MG PO TABS: 1.0000 | ORAL_TABLET | Freq: Two times a day (BID) | ORAL | 0 refills | Status: DC

## 2023-01-03 NOTE — ED Triage Notes (Signed)
Patient presents with headache, bilateral ear ache, chest and shoulder tightness. Patient states she believes it is sinus related. Symptoms started a week ago. Treated with Benadryl sinus without relief.

## 2023-01-04 ENCOUNTER — Other Ambulatory Visit: Payer: Medicaid Other

## 2023-01-05 ENCOUNTER — Other Ambulatory Visit: Payer: Medicaid Other

## 2023-01-08 ENCOUNTER — Inpatient Hospital Stay
Admission: RE | Admit: 2023-01-08 | Discharge: 2023-01-08 | Discharge status: Home / Self Care | Payer: Medicaid Other | Source: Ambulatory Visit | Attending: Physical Medicine and Rehabilitation | Admitting: Physical Medicine and Rehabilitation

## 2023-01-08 DIAGNOSIS — M5416 Radiculopathy, lumbar region: Secondary | ICD-10-CM

## 2023-01-08 DIAGNOSIS — M47816 Spondylosis without myelopathy or radiculopathy, lumbar region: Secondary | ICD-10-CM

## 2023-01-08 DIAGNOSIS — M7918 Myalgia, other site: Secondary | ICD-10-CM

## 2023-01-17 ENCOUNTER — Ambulatory Visit
Admission: RE | Admit: 2023-01-17 | Discharge: 2023-01-17 | Disposition: A | Discharge status: Home / Self Care | Payer: Medicaid Other | Source: Ambulatory Visit

## 2023-01-17 DIAGNOSIS — Z1231 Encounter for screening mammogram for malignant neoplasm of breast: Secondary | ICD-10-CM

## 2023-01-25 NOTE — ED Provider Notes (Signed)
[name]    [name]: [id number] Arrival date & time: 01/03/23  1042      History   Chief Complaint Chief Complaint  Patient presents with   Headache    Sharp pain in ears and face - Entered by patient    HPI Deborah Mcclain is a 52 y.o. female.   Patient here today for evaluation of headache, ear pain with sinus pressure that started a week ago. She reports that she has taken benadryl without significant relief. She does initially report some chest pressure and shoulder pressure but this seems to be related to sinuses. She denies any chest pain or shortness of breath.   The history is provided by the patient.  Headache Associated symptoms: congestion, ear pain and sinus pressure   Associated symptoms: no abdominal pain, no cough, no diarrhea, no fever, no nausea and no vomiting     Past Medical History:  Diagnosis Date   Acanthosis nigricans 10/21/2021   Allergic rhinitis 10/21/2021   Asthma    Eczema    Hypertension    Type 2 diabetes mellitus (HCC) 12/2018    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   Leucocytosis 09/10/2020   Vitamin D deficiency 09/10/2020   Hyperlipidemia 01/20/2020   Iron deficiency anemia 01/20/2020   Hyperglycemia due to diabetes mellitus (HCC) 01/09/2020   Anemia 12/03/2019   Abnormal uterine bleeding (AUB) 12/03/2019   History of blood transfusion 12/03/2019   Essential hypertension 04/16/2019   Type 2 diabetes mellitus with obesity (HCC) 04/16/2019   Morbid obesity with BMI of 50.0-59.9, adult (HCC) 04/16/2019   Menopausal vasomotor syndrome 04/16/2019   Type 2 diabetes mellitus with diabetic polyneuropathy, with long-term current use of insulin (HCC) 04/16/2019    Past  Surgical History:  Procedure Laterality Date   CESAREAN SECTION  05/25/1997   CESAREAN SECTION  03/30/2005   twins   CHOLECYSTECTOMY N/A 06/01/2022   Procedure: LAPAROSCOPIC CHOLECYSTECTOMY;  Surgeon: Manus Rudd, MD;  Location: [facility] OR;  Service: General;  Laterality: N/A;   ENDOMETRIAL BIOPSY  12/03/2019    OB History     Gravida  2   Para  2   Term  1   Preterm  1   AB      Living  3      SAB      IAB      Ectopic      Multiple  1   Live Births  3            Home Medications    Prior to Admission medications   Medication Sig Start Date End Date Taking? Authorizing Provider  amoxicillin-clavulanate (AUGMENTIN) 875-125 MG tablet Take 1 tablet by mouth every 12 (twelve) hours. 01/03/23  Yes Tomi Bamberger, PA-C  ACCU-CHEK GUIDE test strip 1 each by Other route as needed. 11/15/22   [provider]  Accu-Chek Softclix Lancets lancets USE AS DIRECTED TO CHECK BLOOD SUGAR TWICE DAILY 09/23/22   [provider]  Adapalene-Benzoyl Peroxide 0.3-2.5 % GEL Apply 1 Application topically at bedtime.    [provider]  albuterol (VENTOLIN HFA) 108 (90 Base) MCG/ACT inhaler Inhale 2 puffs into the lungs every 4 (four) hours as needed for wheezing or shortness of  breath. 07/01/19   [provider]  amLODipine (NORVASC) 10 MG tablet Take 10 mg by mouth daily. 07/12/21   [provider]  atorvastatin (LIPITOR) 40 MG tablet Take 1 tablet (40 mg total) by mouth at bedtime. 01/12/20   Ghimire, Werner Lean, MD  Azelastine HCl 137 MCG/SPRAY SOLN Place 1 spray into the nose 2 (two) times daily. 07/01/19   [provider]  B-D ULTRAFINE III SHORT PEN 31G X 8 MM MISC 2 (two) times daily. 09/23/22   [provider]  cetirizine (ZYRTEC) 10 MG tablet Take 1 tablet (10 mg total) by mouth daily. Patient taking differently: Take 10 mg by mouth 2 (two) times daily. 01/16/18   Dahlia Byes A, NP  Clobetasol Propionate 0.05 % shampoo Apply  1 Application topically every 14 (fourteen) days. 02/28/22   [provider]  Clobetasol Propionate 0.05 % shampoo Apply topically. 09/29/22   [provider]  cyclobenzaprine (FLEXERIL) 10 MG tablet Take 1 tablet (10 mg total) by mouth 2 (two) times daily as needed for muscle spasms. 11/26/22   Tomi Bamberger, PA-C  famotidine (PEPCID) 20 MG tablet Take 20 mg by mouth daily. 09/05/19   [provider]  Fluocinolone Acetonide Scalp 0.01 % OIL Apply 1 Application topically as needed (Apply twice a day on scalp. Do not need to rinse it). 10/03/19   [provider]  fluticasone (FLONASE) 50 MCG/ACT nasal spray Place 1 spray into both nostrils daily for 3 days. Patient taking differently: Place 2 sprays into both nostrils daily. 12/03/21 06/01/22  Gustavus Bryant, FNP  fluticasone (FLONASE) 50 MCG/ACT nasal spray Place 2 sprays into both nostrils daily. 06/11/19   [provider]  hydrocortisone 2.5 % cream Apply 1 Application topically 2 (two) times daily.    [provider]  insulin aspart (NOVOLOG) 100 UNIT/ML FlexPen 0-20 Units, Subcutaneous, 3 times daily with meals CBG < 70: Implement Hypoglycemia measures, call MD CBG 70 - 120: 0 units CBG 121 - 150: 3 units CBG 151 - 200: 4 units CBG 201 - 250: 7 units CBG 251 - 300: 11 units CBG 301 - 350: 15 units CBG 351 - 400: 20 units CBG > 400: call MD Patient taking differently: Inject 7 Units into the skin in the morning and at bedtime. 06/30/20   Bing Neighbors, NP  KETOCONAZOLE, TOPICAL, 1 % SHAM Apply 1 Application topically as needed (Itching). 09/29/22   [provider]  LANTUS SOLOSTAR 100 UNIT/ML Solostar Pen 46 units in am and 36 units in pm Patient taking differently: Inject 36-46 Units into the skin See admin instructions. Inject 46 units in the morning and 36 units at night 12/03/21   Ervin Knack E, FNP  levocetirizine (XYZAL) 5 MG tablet Take 5 mg by mouth every evening.    [provider]  meloxicam (MOBIC) 15 MG tablet Take 1 tablet (15 mg total) by mouth daily. 11/29/22 11/29/23  Juanda Chance, NP  metFORMIN (GLUCOPHAGE) 1000 MG tablet Take 1,000 mg by mouth 2 (two) times daily with a meal.     [provider]  metFORMIN (GLUCOPHAGE) 1000 MG tablet Take 1 tablet by mouth 2 (two) times daily. 01/20/22 01/20/23  [provider]  metoprolol tartrate (LOPRESSOR) 50 MG tablet Take 1 tablet (50 mg total) by mouth 2 (two) times daily. 01/12/20   Ghimire, Werner Lean, MD  metoprolol tartrate (LOPRESSOR) 50 MG tablet Take 1 tablet by mouth 2 (two) times daily. 01/12/20  [provider]  naproxen (NAPROSYN) 375 MG tablet Take 375 mg by mouth 3 (three) times daily with meals. 09/29/22   [provider]  pregabalin (LYRICA) 25 MG capsule Take 25 mg by mouth 2 (two) times daily.    [provider]  rosuvastatin (CRESTOR) 5 MG tablet Take 5 mg by mouth daily.    [provider]  triamcinolone cream (KENALOG) 0.1 % Apply 1 application topically 2 (two) times daily. 07/24/20   Wieters, Hallie C, PA-C  triamcinolone lotion (KENALOG) 0.1 % Apply 1 Application topically daily as needed. 10/30/22   [provider]  venlafaxine XR (EFFEXOR-XR) 75 MG 24 hr capsule Take 1 capsule (75 mg total) by mouth daily. 12/26/19   Leftwich-Kirby, Wilmer Floor, CNM  venlafaxine XR (EFFEXOR-XR) 75 MG 24 hr capsule Take 75 mg by mouth daily with breakfast. 08/19/19   [provider]    Family History Family History  Problem Relation Age of Onset   Breast cancer Mother    Diabetes Mother    Hypertension Mother    Diabetes Maternal Grandmother     Social History Social History   Tobacco Use   Smoking status: Never   Smokeless tobacco: Never  Vaping Use   Vaping status: Never Used  Substance Use Topics   Alcohol use: Yes    Comment: occ   Drug use: Never     Allergies   Other   Review of Systems Review of Systems   Constitutional:  Negative for chills and fever.  HENT:  Positive for congestion, ear pain and sinus pressure.   Eyes:  Negative for discharge and redness.  Respiratory:  Negative for cough, shortness of breath and wheezing.   Cardiovascular:  Negative for chest pain.  Gastrointestinal:  Negative for abdominal pain, diarrhea, nausea and vomiting.  Neurological:  Positive for headaches.     Physical Exam Triage Vital Signs ED Triage Vitals  Encounter Vitals Group     BP 01/03/23 1145 (!) 147/95     Systolic BP Percentile --      Diastolic BP Percentile --      Pulse Rate 01/03/23 1145 77     Resp 01/03/23 1145 18     Temp 01/03/23 1145 98.3 F (36.8 C)     Temp Source 01/03/23 1145 Oral     SpO2 01/03/23 1145 95 %     Weight 01/03/23 1144 270 lb (122.5 kg)     Height 01/03/23 1144 5\' 4"  (1.626 m)     Head Circumference --      Peak Flow --      Pain Score 01/03/23 1143 9     Pain Loc --      Pain Education --      Exclude from Growth Chart --    No data found.  Updated Vital Signs BP (!) 147/95 (BP Location: Left Wrist)   Pulse 77   Temp 98.3 F (36.8 C) (Oral)   Resp 18   Ht 5\' 4"  (1.626 m)   Wt 270 lb (122.5 kg)   LMP 01/31/2020 (Approximate)   SpO2 95% Comment: long nails, taken from side  BMI 46.35 kg/m   Visual Acuity Right Eye Distance:   Left Eye Distance:   Bilateral Distance:    Right Eye Near:   Left Eye Near:    Bilateral Near:     Physical Exam Vitals and nursing note reviewed.  Constitutional:      General: She is not in  acute distress.    Appearance: Normal appearance. She is not ill-appearing.  HENT:     Head: Normocephalic and atraumatic.     Ears:     Comments: Bilateral Tms erythematous    Nose: Congestion present.     Mouth/Throat:     Mouth: Mucous membranes are moist.     Pharynx: No oropharyngeal exudate or posterior oropharyngeal erythema.  Eyes:     Conjunctiva/sclera: Conjunctivae normal.  Cardiovascular:     Rate and  Rhythm: Normal rate and regular rhythm.     Heart sounds: Normal heart sounds. No murmur heard. Pulmonary:     Effort: Pulmonary effort is normal. No respiratory distress.     Breath sounds: Normal breath sounds. No wheezing, rhonchi or rales.  Skin:    General: Skin is warm and dry.  Neurological:     Mental Status: She is alert.  Psychiatric:        Mood and Affect: Mood normal.        Thought Content: Thought content normal.      UC Treatments / Results  Labs (all labs ordered are listed, but only abnormal results are displayed) Labs Reviewed - No data to display  EKG   Radiology No results found.  Procedures Procedures (including critical care time)  Medications Ordered in UC Medications - No data to display  Initial Impression / Assessment and Plan / UC Course  I have reviewed the triage vital signs and the nursing notes.  Pertinent labs & imaging results that were available during my care of the patient were reviewed by me and considered in my medical decision making (see chart for details).    Will treat to cover otitis media as well as sinusitis with strict instruction to report to ED with any worsening symptoms. Low suspicion for cardiac etiology of chest pressure given symptoms.   Final Clinical Impressions(s) / UC Diagnoses   Final diagnoses:  Acute sinusitis, recurrence not specified, unspecified location  Other acute nonsuppurative otitis media of both ears, recurrence not specified   Discharge Instructions   None    ED Prescriptions     Medication Sig Dispense Auth. Provider   amoxicillin-clavulanate (AUGMENTIN) 875-125 MG tablet Take 1 tablet by mouth every 12 (twelve) hours. 14 tablet Tomi Bamberger, PA-C      PDMP not reviewed this encounter.   Tomi Bamberger, PA-C 01/25/23 2027

## 2023-01-31 ENCOUNTER — Telehealth: Payer: Self-pay | Admitting: Physical Medicine and Rehabilitation

## 2023-01-31 NOTE — Telephone Encounter (Signed)
I discussed recent lumbar MRI imaging with patient this morning, imaging looks good for her age, no significant nerve compression, no high grade spinal canal stenosis. 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 Could try lumbar epidural steroid injection vs re-grouping with formal physical therapy, she would like time to think about options. She will call us back.

## 2023-02-13 ENCOUNTER — Encounter: Payer: Self-pay | Admitting: Pediatrics

## 2023-02-15 ENCOUNTER — Ambulatory Visit: Payer: Medicaid Other | Admitting: Gastroenterology

## 2023-02-22 DIAGNOSIS — E66813 Obesity, class 3: Secondary | ICD-10-CM | POA: Insufficient documentation

## 2023-04-02 NOTE — Progress Notes (Deleted)
[facility] Initial Consultation   Referring Provider Quita Skye, PA-C [address] [place],  [place] [address]  Primary Care Provider Quita Skye, PA-C  Patient Profile: Deborah Mcclain is a 53 y.o. female who is seen in consultation in the [facility] at the request of Dr. Charlynne Pander for evaluation and management of the problem(s) noted below.  Problem List: @DIAGSHORT @   History of Present Illness   Deborah Mcclain is a 53 y.o. female with a history of ***.   Last colonoscopy: *** Last endoscopy: ***  Last Abd CT/CTE/MRE: ***  GI Review of Symptoms Significant for {GIROS:50592}. Otherwise negative.  General Review of Systems  Review of systems is significant for the pertinent positives and negatives as listed per the HPI.  Full ROS is otherwise negative.  Past Medical History   Past Medical History:  Diagnosis Date   Acanthosis nigricans 10/21/2021   Allergic rhinitis 10/21/2021   Asthma    Eczema    Hypertension    Type 2 diabetes mellitus (HCC) 12/2018     Past Surgical History   Past Surgical History:  Procedure Laterality Date   CESAREAN SECTION  05/25/1997   CESAREAN SECTION  03/30/2005   twins   CHOLECYSTECTOMY N/A 06/01/2022   Procedure: LAPAROSCOPIC CHOLECYSTECTOMY;  Surgeon: Manus Rudd, MD;  Location: [facility] OR;  Service: General;  Laterality: N/A;   ENDOMETRIAL BIOPSY  12/03/2019     Allergies and Medications   Allergies  Allergen Reactions   Other Itching    Ketchup    @MEDSTODAY @  Family History   Family History  Problem Relation Age of Onset   Breast cancer Mother    Diabetes Mother    Hypertension Mother    Diabetes Maternal Grandmother      Social History   Social History   Tobacco Use   Smoking status: Never   Smokeless tobacco: Never  Vaping Use   Vaping status: Never Used  Substance Use Topics   Alcohol use: Yes    Comment: occ   Drug use: Never   [place] reports that she has never  smoked. She has never used smokeless tobacco. She reports current alcohol use. She reports that she does not use drugs.  Vital Signs and Physical Examination  There were no vitals filed for this visit. There is no height or weight on file to calculate BMI.    General: Well developed, well nourished, no acute distress Head: Normocephalic and atraumatic Eyes: Sclerae anicteric, EOMI Ears: Normal auditory acuity Mouth: No deformities or lesions noted Lungs: Clear throughout to auscultation Heart: Regular rate and rhythm; No murmurs, rubs or bruits Abdomen: Soft, non tender and non distended. No masses, hepatosplenomegaly or hernias noted. Normal Bowel sounds Rectal: Musculoskeletal: Symmetrical with no gross deformities  Pulses:  Normal pulses noted Extremities: No edema or deformities noted Neurological: Alert oriented x 4, grossly nonfocal Psychological:  Alert and cooperative. Normal mood and affect  Review of Data  The following data was reviewed at the time of this encounter:  Laboratory Studies      Latest Ref Rng & Units 06/03/2022    4:37 AM 06/02/2022    3:41 AM 06/01/2022    9:26 AM  CBC  WBC 4.0 - 10.5 K/uL 10.6  14.6  11.3   Hemoglobin 12.0 - 15.0 g/dL 32.4  40.1  02.7   Hematocrit 36.0 - 46.0 % 34.1  38.6  43.0   Platelets 150 - 400 K/uL 298  359  368  Lab Results  Component Value Date   LIPASE 31 06/01/2022      Latest Ref Rng & Units 06/03/2022    4:37 AM 06/02/2022    3:41 AM 06/01/2022    9:26 AM  CMP  Glucose 70 - 99 mg/dL 161  096  045   BUN 6 - 20 mg/dL 13  12  7    Creatinine 0.44 - 1.00 mg/dL 4.09  8.11  9.14   Sodium 135 - 145 mmol/L 134  132  135   Potassium 3.5 - 5.1 mmol/L 4.1  4.2  4.7   Chloride 98 - 111 mmol/L 104  101  100   CO2 22 - 32 mmol/L 23  20  21    Calcium 8.9 - 10.3 mg/dL 8.5  8.9  9.6   Total Protein 6.5 - 8.1 g/dL 6.5  7.2  7.8   Total Bilirubin 0.3 - 1.2 mg/dL 0.6  0.7  0.9   Alkaline Phos 38 - 126 U/L 100  117  138   AST  15 - 41 U/L 55  118  59   ALT 0 - 44 U/L 70  113  70      Imaging Studies  AUS 06/01/2022 1. Evaluation is somewhat limited due to body habitus. Within this limitation, there is redemonstrated gallbladder wall thickening, with cholelithiasis but no sonographic Murphy sign. If there is persistent concern for cholecystitis, consider HIDA scan. 2. The common bile duct is mildly dilated measuring 8 mm, unchanged from the prior exam. Consider ERCP or MRCP for better evaluation, as biliary obstruction cannot be excluded. 3. Diffuse increased echogenicity throughout the liver, which is nonspecific and can be seen in setting of hepatic steatosis or other underlying intrinsic liver disease.  RUQ [place] 04/13/2022 1. The findings are most consistent with a stone filled gallbladder demonstrating wall thickening. No Murphy's sign. If there is concern for cholecystitis and the clinical picture is ambiguous, recommend HIDA scan. 2. The common bile duct is dilated measuring 8.3 mm. Recommend ERCP or MRCP for better evaluation. Biliary obstruction is not excluded on today's imaging. 3. Diffuse increased echogenicity throughout the liver is nonspecific and may be seen with hepatic steatosis or other underlying intrinsic liver disease.    GI Procedures and Studies      Clinical Impression  It is my clinical impression that Deborah Mcclain is a 53 y.o. female with;  ***  Plan  *** *** *** *** ***  Planned Follow Up No follow-ups on file.  The patient or caregiver verbalized understanding of the material covered, with no barriers to understanding. All questions were answered. Patient or caregiver is agreeable with the plan outlined above.    It was a pleasure to see Deborah Mcclain.  If you have any questions or concerns regarding this evaluation, do not hesitate to contact me.  Maren Beach, MD [facility]

## 2023-04-03 ENCOUNTER — Ambulatory Visit: Payer: Medicaid Other | Admitting: Pediatrics

## 2023-04-03 DIAGNOSIS — R748 Abnormal levels of other serum enzymes: Secondary | ICD-10-CM

## 2023-04-03 DIAGNOSIS — Z1211 Encounter for screening for malignant neoplasm of colon: Secondary | ICD-10-CM

## 2023-04-03 DIAGNOSIS — K838 Other specified diseases of biliary tract: Secondary | ICD-10-CM

## 2023-04-04 ENCOUNTER — Ambulatory Visit
Admission: RE | Admit: 2023-04-04 | Discharge: 2023-04-04 | Disposition: A | Discharge status: Home / Self Care | Payer: Medicaid Other | Source: Ambulatory Visit | Attending: Family Medicine | Admitting: Family Medicine

## 2023-04-04 VITALS — BP 155/93 | HR 84 | Temp 98.1°F | Resp 18

## 2023-04-04 DIAGNOSIS — E1165 Type 2 diabetes mellitus with hyperglycemia: Secondary | ICD-10-CM | POA: Diagnosis not present

## 2023-04-04 DIAGNOSIS — J4521 Mild intermittent asthma with (acute) exacerbation: Secondary | ICD-10-CM

## 2023-04-04 DIAGNOSIS — Z794 Long term (current) use of insulin: Secondary | ICD-10-CM | POA: Diagnosis not present

## 2023-04-04 DIAGNOSIS — J0101 Acute recurrent maxillary sinusitis: Secondary | ICD-10-CM | POA: Diagnosis not present

## 2023-04-04 LAB — POCT FASTING CBG KUC MANUAL ENTRY

## 2023-04-04 MED ORDER — CEFDINIR 300 MG PO CAPS: 300.0000 mg | ORAL_CAPSULE | Freq: Two times a day (BID) | ORAL | 0 refills | Status: DC

## 2023-04-04 NOTE — ED Triage Notes (Signed)
Patient is here for sinus pressure and headache x 2 days.

## 2023-04-04 NOTE — ED Provider Notes (Signed)
[facility]   [id number] 04/04/23 Arrival Time: 1235  ASSESSMENT & PLAN:  1. Acute recurrent maxillary sinusitis   2. Mild intermittent asthma with acute exacerbation   3. Type 2 diabetes mellitus with hyperglycemia, with long-term current use of insulin (HCC)    Begin: Meds ordered this encounter  Medications   cefdinir (OMNICEF) 300 MG capsule    Sig: Take 1 capsule (300 mg total) by mouth 2 (two) times daily.    Dispense:  20 capsule    Refill:  0   CBG 362. No current wheezing. Has albuterol inhaler at home. Will hold off on prednisone. OTC symptom care as needed.   Follow-up Information     Schedule an appointment as soon as possible for a visit  with Quita Skye, PA-C.   Specialty: Physician Assistant Why: For follow up. Contact information: [address] [place] [address] [phone]                 Reviewed expectations re: course of current medical issues. Questions answered. Outlined signs and symptoms indicating need for more acute intervention. Patient verbalized understanding. After Visit Summary given.   SUBJECTIVE: History from: patient.  Deborah Mcclain is a 53 y.o. female who presents with complaint of nasal congestion, post-nasal drainage, and sinus pain. Onset gradual,  over the past couple of weeks; more pressure over past two days . Respiratory symptoms: none. Fever: denies. Overall normal PO intake without n/v. OTC treatment: none PTA. History of frequent sinus infections: "occasionally"; treated 12/2022 for sinusitis. No specific aggravating or alleviating factors reported. Social History   Tobacco Use  Smoking Status Never  Smokeless Tobacco Never    OBJECTIVE:  Vitals:   04/04/23 1345  BP: (!) 155/93  Pulse: 84  Resp: 18  Temp: 98.1 F (36.7 C)  TempSrc: Oral  SpO2: 97%    General appearance: alert; no distress HEENT: nasal congestion; clear runny nose; throat irritation secondary to post-nasal  drainage; bilateral maxillary tenderness to palpation; turbinates boggy Neck: supple without LAD; trachea midline Lungs: unlabored respirations, symmetrical air entry; cough: absent; no respiratory distress Skin: warm and dry Psychological: alert and cooperative; normal mood and affect  Allergies  Allergen Reactions   Other Itching    Ketchup    Past Medical History:  Diagnosis Date   Acanthosis nigricans 10/21/2021   Allergic rhinitis 10/21/2021   Asthma    Eczema    Hypertension    Type 2 diabetes mellitus (HCC) 12/2018   Family History  Problem Relation Age of Onset   Breast cancer Mother    Diabetes Mother    Hypertension Mother    Diabetes Maternal Grandmother    Social History   Socioeconomic History   Marital status: Divorced    Spouse name: Not on file   Number of children: Not on file   Years of education: Not on file   Highest education level: Not on file  Occupational History   Not on file  Tobacco Use   Smoking status: Never   Smokeless tobacco: Never  Vaping Use   Vaping status: Never Used  Substance and Sexual Activity   Alcohol use: Yes    Comment: occ   Drug use: Never   Sexual activity: Yes    Partners: Male    Birth control/protection: Surgical  Other Topics Concern   Not on file  Social History Narrative   Not on file   Social Drivers of Health   Financial Resource Strain: Not  at Risk (01/23/2023)   Received from [employer] Resource Strain: 1  Food Insecurity: Not at Risk (01/23/2023)   Received from [employer]    Food: 1  Transportation Needs: Not at Risk (01/23/2023)   Received from [employer] Needs    Transportation: 1  Physical Activity: Not on File (07/01/2021)   Received from [place], [place]   Physical Activity    Physical Activity: 0  Stress: Not on File (07/01/2021)   Received from [facility], [place]   Stress    Stress: 0  Social Connections: Not on File (11/15/2022)    Received from [facility]   Social Connections    Connectedness: 0  Intimate Partner Violence: Not At Risk (06/01/2022)   Humiliation, Afraid, Rape, and Kick questionnaire    Fear of Current or Ex-Partner: No    Emotionally Abused: No    Physically Abused: No    Sexually Abused: No             Mardella Layman, MD 04/04/23 [id number]

## 2023-04-04 NOTE — Discharge Instructions (Signed)
Your blood sugar was 362 here. Ensure you are taking your medications as directed.

## 2023-05-20 ENCOUNTER — Ambulatory Visit
Admission: RE | Admit: 2023-05-20 | Discharge: 2023-05-20 | Disposition: A | Discharge status: Home / Self Care | Payer: Self-pay | Source: Ambulatory Visit | Attending: Internal Medicine | Admitting: Internal Medicine

## 2023-05-20 VITALS — BP 177/104 | HR 104 | Temp 98.1°F | Resp 20

## 2023-05-20 DIAGNOSIS — R051 Acute cough: Secondary | ICD-10-CM | POA: Diagnosis not present

## 2023-05-20 DIAGNOSIS — B349 Viral infection, unspecified: Secondary | ICD-10-CM | POA: Insufficient documentation

## 2023-05-20 DIAGNOSIS — U071 COVID-19: Secondary | ICD-10-CM | POA: Diagnosis not present

## 2023-05-20 DIAGNOSIS — Z20822 Contact with and (suspected) exposure to covid-19: Secondary | ICD-10-CM | POA: Diagnosis present

## 2023-05-20 DIAGNOSIS — J029 Acute pharyngitis, unspecified: Secondary | ICD-10-CM | POA: Diagnosis present

## 2023-05-20 LAB — POCT RAPID STREP A (OFFICE): Rapid Strep A Screen: NEGATIVE

## 2023-05-20 LAB — POC COVID19/FLU A&B COMBO
Covid Antigen, POC: NEGATIVE
Influenza A Antigen, POC: NEGATIVE
Influenza B Antigen, POC: NEGATIVE

## 2023-05-20 MED ORDER — BENZONATATE 100 MG PO CAPS: 100.0000 mg | ORAL_CAPSULE | Freq: Three times a day (TID) | ORAL | 0 refills | Status: DC | PRN

## 2023-05-20 MED ORDER — ALBUTEROL SULFATE HFA 108 (90 BASE) MCG/ACT IN AERS: 1.0000 | INHALATION_SPRAY | Freq: Four times a day (QID) | RESPIRATORY_TRACT | 0 refills | Status: DC | PRN

## 2023-05-20 MED ORDER — CHLORASEPTIC 1.4 % MT LIQD: 1.0000 | OROMUCOSAL | 0 refills | Status: AC | PRN

## 2023-05-20 NOTE — ED Triage Notes (Signed)
 Pt presents with sore throat, headache, and ear pain. Pt states her throat started off scratchy but now it burns when she swallows. Pt denies emesis and fever.

## 2023-05-20 NOTE — ED Provider Notes (Addendum)
 [name]    [name]: [id number] Arrival date & time: 05/20/23  1128      History   Chief Complaint Chief Complaint  Patient presents with   Headache    Sore throat and earache - Entered by patient   Sore Throat   Otalgia    HPI Deborah Mcclain is a 53 y.o. female.   Patient presents with approximately 2-day history of sore throat, headache, ear pain, cough.  Reports her mother is currently in the hospital and has tested positive for COVID-19.  Denies any associated fever.  Denies nasal congestion.  Blood pressure is elevated but patient reports she has not taken her blood pressure medication today.   Headache Sore Throat  Otalgia   Past Medical History:  Diagnosis Date   Acanthosis nigricans 10/21/2021   Allergic rhinitis 10/21/2021   Asthma    Eczema    Hypertension    Type 2 diabetes mellitus (HCC) 12/2018    Patient Active Problem List   Diagnosis Date Noted   Class 3 severe obesity due to excess calories with serious comorbidity and body mass index (BMI) of 45.0 to 49.9 in adult [facility] 02/22/2023   Acute cholecystitis 06/01/2022   GERD (gastroesophageal reflux disease) 02/15/2022   Mild intermittent asthma 02/15/2022   Sciatica 07/12/2021   Chronic right-sided low back pain with right-sided sciatica 07/12/2021   Pain due to onychomycosis of toenails of both feet 12/02/2020   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   Leucocytosis 09/10/2020   Vitamin D deficiency 09/10/2020   Hyperlipidemia 01/20/2020   Iron deficiency anemia 01/20/2020   Hyperglycemia due to diabetes mellitus (HCC) 01/09/2020   Anemia 12/03/2019   Abnormal uterine bleeding (AUB) 12/03/2019   History of blood transfusion 12/03/2019   Essential hypertension 04/16/2019   Type 2 diabetes mellitus with obesity (HCC) 04/16/2019   Morbid obesity with BMI of 50.0-59.9, adult (HCC) 04/16/2019   Menopausal vasomotor syndrome 04/16/2019   Type 2 diabetes mellitus with diabetic polyneuropathy,  with long-term current use of insulin (HCC) 04/16/2019    Past Surgical History:  Procedure Laterality Date   CESAREAN SECTION  05/25/1997   CESAREAN SECTION  03/30/2005   twins   CHOLECYSTECTOMY N/A 06/01/2022   Procedure: LAPAROSCOPIC CHOLECYSTECTOMY;  Surgeon: Manus Rudd, MD;  Location: [facility] OR;  Service: General;  Laterality: N/A;   ENDOMETRIAL BIOPSY  12/03/2019    OB History     Gravida  2   Para  2   Term  1   Preterm  1   AB      Living  3      SAB      IAB      Ectopic      Multiple  1   Live Births  3            Home Medications    Prior to Admission medications   Medication Sig Start Date End Date Taking? Authorizing Provider  amLODipine (NORVASC) 10 MG tablet Take 1 tablet by mouth daily. 01/23/23  Yes [provider]  aspirin-acetaminophen-caffeine (EXCEDRIN MIGRAINE) [phone] MG tablet Take by mouth. 01/23/23  Yes [provider]  benzonatate (TESSALON) 100 MG capsule Take 1 capsule (100 mg total) by mouth every 8 (eight) hours as needed for cough. 05/20/23  Yes Roosevelt Eimers, Rolly Salter E, FNP  doxycycline (VIBRAMYCIN) 100 MG capsule Take 100 mg by mouth 2 (two) times daily. 02/20/23  Yes [provider]  empagliflozin (JARDIANCE) 10 MG TABS tablet Take 1 tablet  by mouth daily. 01/23/23  Yes [provider]  folic acid (FOLVITE) 1 MG tablet Take by mouth. 01/10/23  Yes [provider]  folic acid (FOLVITE) 1 MG tablet Take 1 mg by mouth daily. 04/07/23  Yes [provider]  folic acid (FOLVITE) 1 MG tablet Take 1 mg by mouth daily. 02/12/23  Yes [provider]  methotrexate (RHEUMATREX) 2.5 MG tablet TAKE 7 TABLETS BY MOUTH ONCE A WEEK 01/11/23  Yes [provider]  phenol (CHLORASEPTIC) 1.4 % LIQD Use as directed 1 spray in the mouth or throat as needed. 05/20/23  Yes Fawaz Borquez, Acie Fredrickson, FNP  Semaglutide,0.25 or 0.5MG /DOS, 2 MG/3ML SOPN Inject into the skin. 02/20/23  Yes [provider]  ACCU-CHEK GUIDE test strip 1 each by Other route as needed. 11/15/22   [provider]  Accu-Chek Softclix Lancets lancets USE AS DIRECTED TO CHECK BLOOD SUGAR TWICE DAILY 09/23/22   [provider]  Adapalene-Benzoyl Peroxide 0.3-2.5 % GEL Apply 1 Application topically at bedtime.    [provider]  albuterol (VENTOLIN HFA) 108 (90 Base) MCG/ACT inhaler Inhale 1-2 puffs into the lungs every 6 (six) hours as needed for wheezing or shortness of breath. 05/20/23   Gustavus Bryant, FNP  amLODipine (NORVASC) 10 MG tablet Take 10 mg by mouth daily. 07/12/21   [provider]  atorvastatin (LIPITOR) 40 MG tablet Take 1 tablet (40 mg total) by mouth at bedtime. 01/12/20   Ghimire, Werner Lean, MD  Azelastine HCl 137 MCG/SPRAY SOLN Place 1 spray into the nose 2 (two) times daily. 07/01/19   [provider]  B-D ULTRAFINE III SHORT PEN 31G X 8 MM MISC 2 (two) times daily. 09/23/22   [provider]  cefdinir (OMNICEF) 300 MG capsule Take 1 capsule (300 mg total) by mouth 2 (two) times daily. 04/04/23   Mardella Layman, MD  cetirizine (ZYRTEC) 10 MG tablet Take 1 tablet (10 mg total) by mouth daily. Patient taking differently: Take 10 mg by mouth 2 (two) times daily. 01/16/18   Dahlia Byes A, FNP  Clobetasol Propionate 0.05 % shampoo Apply 1 Application topically every 14 (fourteen) days. 02/28/22   [provider]  cyclobenzaprine (FLEXERIL) 10 MG tablet Take 1 tablet (10 mg total) by mouth 2 (two) times daily as needed for muscle spasms. 11/26/22   Tomi Bamberger, PA-C  famotidine (PEPCID) 20 MG tablet Take 20 mg by mouth daily. 09/05/19   [provider]  fluticasone (FLONASE) 50 MCG/ACT nasal spray Place 1 spray into both nostrils daily for 3 days. Patient taking differently: Place 2 sprays into both nostrils daily. 12/03/21 06/01/22  Gustavus Bryant, FNP  fluticasone (FLONASE) 50 MCG/ACT nasal spray Place 2 sprays into both nostrils daily. 06/11/19    [provider]  insulin aspart (NOVOLOG) 100 UNIT/ML FlexPen 0-20 Units, Subcutaneous, 3 times daily with meals CBG < 70: Implement Hypoglycemia measures, call MD CBG 70 - 120: 0 units CBG 121 - 150: 3 units CBG 151 - 200: 4 units CBG 201 - 250: 7 units CBG 251 - 300: 11 units CBG 301 - 350: 15 units CBG 351 - 400: 20 units CBG > 400: call MD Patient taking differently: Inject 7 Units into the skin in the morning and at bedtime. 06/30/20   Bing Neighbors, NP  LANTUS SOLOSTAR 100 UNIT/ML Solostar Pen 46 units in am and 36 units in pm Patient taking differently: Inject 36-46 Units into the skin See admin  instructions. Inject 46 units in the morning and 36 units at night 12/03/21   Ervin Knack E, FNP  levocetirizine (XYZAL) 5 MG tablet Take 5 mg by mouth every evening.    [provider]  meloxicam (MOBIC) 15 MG tablet Take 1 tablet (15 mg total) by mouth daily. 11/29/22 11/29/23  Juanda Chance, NP  metFORMIN (GLUCOPHAGE) 1000 MG tablet Take 1,000 mg by mouth 2 (two) times daily with a meal.     [provider]  metFORMIN (GLUCOPHAGE) 1000 MG tablet Take 1 tablet by mouth 2 (two) times daily. 01/20/22 01/20/23  [provider]  metoprolol tartrate (LOPRESSOR) 50 MG tablet Take 1 tablet (50 mg total) by mouth 2 (two) times daily. 01/12/20   Ghimire, Werner Lean, MD  naproxen (NAPROSYN) 375 MG tablet Take 375 mg by mouth 3 (three) times daily with meals. 09/29/22   [provider]  pregabalin (LYRICA) 25 MG capsule Take 25 mg by mouth 2 (two) times daily.    [provider]  rosuvastatin (CRESTOR) 5 MG tablet Take 5 mg by mouth daily.    [provider]  triamcinolone cream (KENALOG) 0.1 % Apply 1 application topically 2 (two) times daily. 07/24/20   Wieters, Hallie C, PA-C  valsartan (DIOVAN) 80 MG tablet Take 80 mg by mouth daily.    [provider]  venlafaxine XR (EFFEXOR-XR) 75 MG 24 hr capsule Take 1 capsule (75 mg total) by  mouth daily. 12/26/19   Leftwich-Kirby, Wilmer Floor, CNM  venlafaxine XR (EFFEXOR-XR) 75 MG 24 hr capsule Take 75 mg by mouth daily with breakfast. 08/19/19   [provider]    Family History Family History  Problem Relation Age of Onset   Breast cancer Mother    Diabetes Mother    Hypertension Mother    Diabetes Maternal Grandmother     Social History Social History   Tobacco Use   Smoking status: Never   Smokeless tobacco: Never  Vaping Use   Vaping status: Never Used  Substance Use Topics   Alcohol use: Yes    Comment: occ   Drug use: Never     Allergies   Other   Review of Systems Review of Systems Per HPI  Physical Exam Triage Vital Signs ED Triage Vitals  Encounter Vitals Group     BP 05/20/23 1226 (!) 177/104     Systolic BP Percentile --      Diastolic BP Percentile --      Pulse Rate 05/20/23 1226 (!) 104     Resp 05/20/23 1226 20     Temp 05/20/23 1226 98.1 F (36.7 C)     Temp Source 05/20/23 1226 Oral     SpO2 05/20/23 1226 96 %     Weight --      Height --      Head Circumference --      Peak Flow --      Pain Score 05/20/23 1224 7     Pain Loc --      Pain Education --      Exclude from Growth Chart --    No data found.  Updated Vital Signs BP (!) 177/104 (BP Location: Left Arm)   Pulse (!) 104   Temp 98.1 F (36.7 C) (Oral)   Resp 20   LMP 01/31/2020 (Approximate)   SpO2 96%   Visual Acuity Right Eye Distance:   Left Eye Distance:   Bilateral Distance:    Right Eye Near:  Left Eye Near:    Bilateral Near:     Physical Exam Constitutional:      General: She is not in acute distress.    Appearance: Normal appearance. She is not toxic-appearing or diaphoretic.  HENT:     Head: Normocephalic and atraumatic.     Right Ear: Tympanic membrane and ear canal normal.     Left Ear: Tympanic membrane and ear canal normal.     Nose: Congestion present.     Mouth/Throat:     Mouth: Mucous membranes are moist.     Pharynx:  Posterior oropharyngeal erythema present.  Eyes:     Extraocular Movements: Extraocular movements intact.     Conjunctiva/sclera: Conjunctivae normal.     Pupils: Pupils are equal, round, and reactive to light.  Cardiovascular:     Rate and Rhythm: Normal rate and regular rhythm.     Pulses: Normal pulses.     Heart sounds: Normal heart sounds.  Pulmonary:     Effort: Pulmonary effort is normal. No respiratory distress.     Breath sounds: Normal breath sounds. No stridor. No wheezing, rhonchi or rales.  Musculoskeletal:        General: Normal range of motion.     Cervical back: Normal range of motion.  Skin:    General: Skin is warm and dry.  Neurological:     General: No focal deficit present.     Mental Status: She is alert and oriented to person, place, and time. Mental status is at baseline.  Psychiatric:        Mood and Affect: Mood normal.        Behavior: Behavior normal.      UC Treatments / Results  Labs (all labs ordered are listed, but only abnormal results are displayed) Labs Reviewed  POC COVID19/FLU A&B COMBO - Normal  POCT RAPID STREP A (OFFICE) - Normal  SARS CORONAVIRUS 2 (TAT 6-24 HRS)  CULTURE, GROUP A STREP [facility])    EKG   Radiology No results found.  Procedures Procedures (including critical care time)  Medications Ordered in UC Medications - No data to display  Initial Impression / Assessment and Plan / UC Course  I have reviewed the triage vital signs and the nursing notes.  Pertinent labs & imaging results that were available during my care of the patient were reviewed by me and considered in my medical decision making (see chart for details).     Strep, COVID, flu all negative.  Given close exposure to COVID-19, will confirm with COVID PCR.  Awaiting results.  Patient prescribed medications to help alleviate symptoms.  Refilled albuterol inhaler as patient reports that she is not able to find her inhaler.  Advised adequate fluids, rest,  symptom management.  Advised strict return precautions.  Blood pressure slightly elevated but patient has not taken her medication today.  Encouraged her to take blood pressure medication and monitor at home.  Advised return precautions for BP as well. Patient verbalized understanding and was agreeable with plan. Final Clinical Impressions(s) / UC Diagnoses   Final diagnoses:  Viral illness  Acute cough  Sore throat  Close exposure to COVID-19 virus   Discharge Instructions   None    ED Prescriptions     Medication Sig Dispense Auth. Provider   albuterol (VENTOLIN HFA) 108 (90 Base) MCG/ACT inhaler Inhale 1-2 puffs into the lungs every 6 (six) hours as needed for wheezing or shortness of breath. 1 each [place], Acie Fredrickson, [place]  benzonatate (TESSALON) 100 MG capsule Take 1 capsule (100 mg total) by mouth every 8 (eight) hours as needed for cough. 21 capsule [place], Locust E, [place]   phenol (CHLORASEPTIC) 1.4 % LIQD Use as directed 1 spray in the mouth or throat as needed. 118 mL Gustavus Bryant, [place]      PDMP not reviewed this encounter.   Gustavus Bryant, [place] 05/20/23 1419    Gustavus Bryant, [place] 05/20/23 1420

## 2023-05-21 ENCOUNTER — Telehealth: Payer: Self-pay | Admitting: Internal Medicine

## 2023-05-21 LAB — SARS CORONAVIRUS 2 (TAT 6-24 HRS): SARS Coronavirus 2: POSITIVE — AB

## 2023-05-21 MED ORDER — PAXLOVID (300/100) 20 X 150 MG & 10 X 100MG PO TBPK: 3.0000 | ORAL_TABLET | Freq: Two times a day (BID) | ORAL | 0 refills | Status: AC

## 2023-05-21 NOTE — Telephone Encounter (Signed)
 Patient called back.  Discussed COVID-positive results.  She wishes to proceed with Paxlovid.  This was prescribed for patient.  Educated patient to temporarily discontinue rosuvastatin while taking Paxlovid given side effects.  She voiced understanding of this.  All questions answered.

## 2023-05-21 NOTE — Telephone Encounter (Signed)
 Attempted to call patient to notify her of positive covid test results. Patient stated at urgent care visit that she may be interested in discussing paxlovid treatment if covid test is positive. Patient will need to temporarily discontinue statin if she starts paxlovid. Paxlovid will have to be initiated within 5 days of symptoms start as well. LVM for call back per privacy policy.

## 2023-05-22 LAB — CULTURE, GROUP A STREP (THRC)

## 2023-06-07 ENCOUNTER — Ambulatory Visit: Admitting: Podiatry

## 2023-06-15 IMAGING — MG MM DIGITAL SCREENING BILAT W/ TOMO AND CAD
6 of 10 series · 6 of 30 positions shown · non-contrast
Comparison: Previous exam(s).

CLINICAL DATA: Screening.

EXAM:
DIGITAL SCREENING BILATERAL MAMMOGRAM WITH TOMOSYNTHESIS AND CAD
TECHNIQUE: Bilateral screening digital craniocaudal and mediolateral oblique
mammograms were obtained. Bilateral screening digital breast
tomosynthesis was performed. The images were evaluated with
computer-aided detection.

[R CC synth-2D]
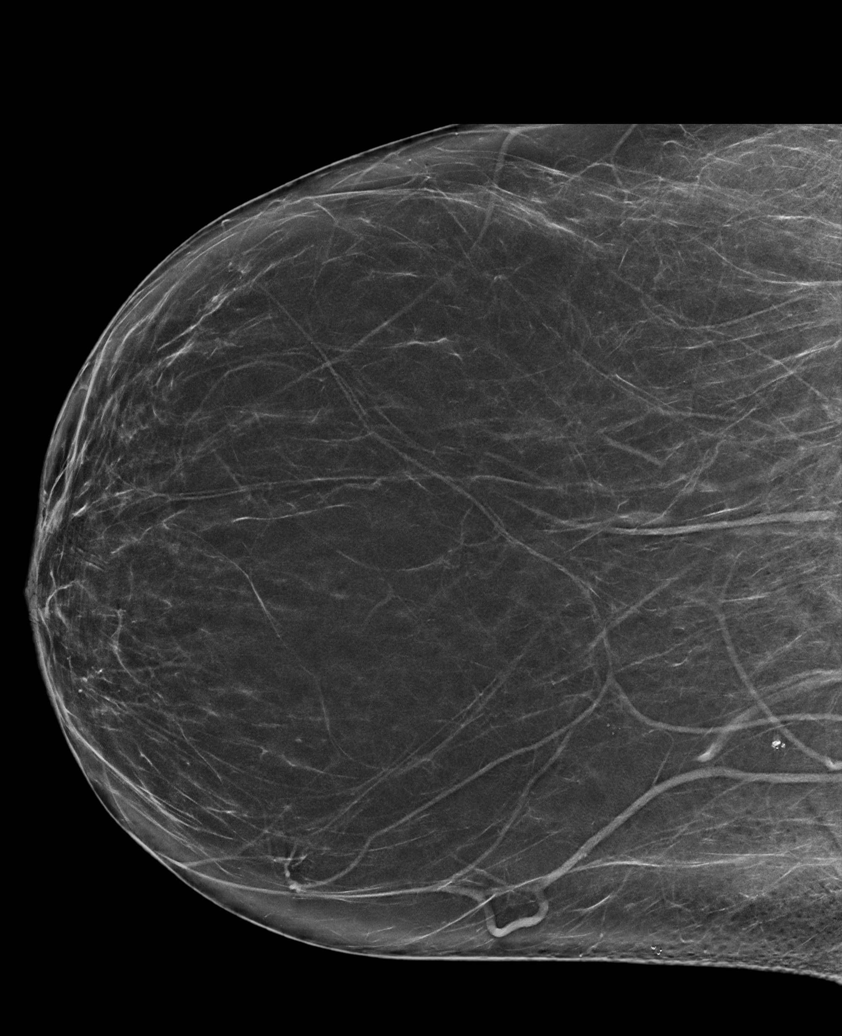

[L CC synth-2D (1 of 2)]
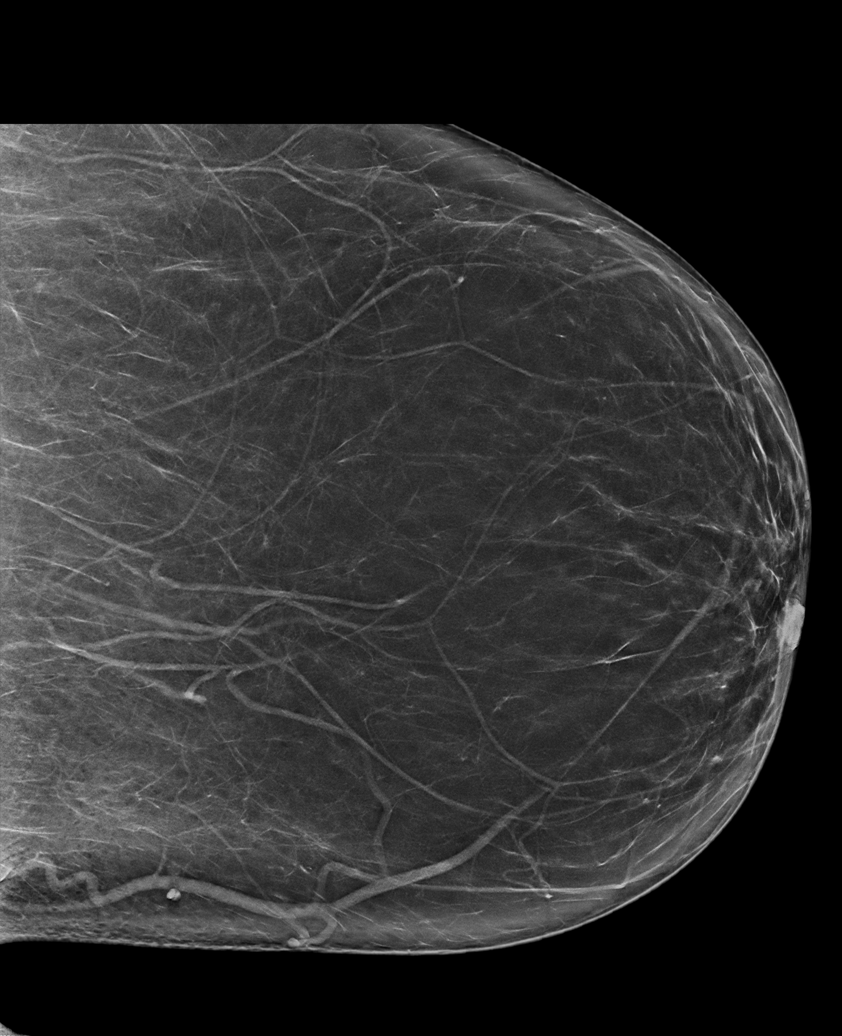

[L CC synth-2D (2 of 2)]
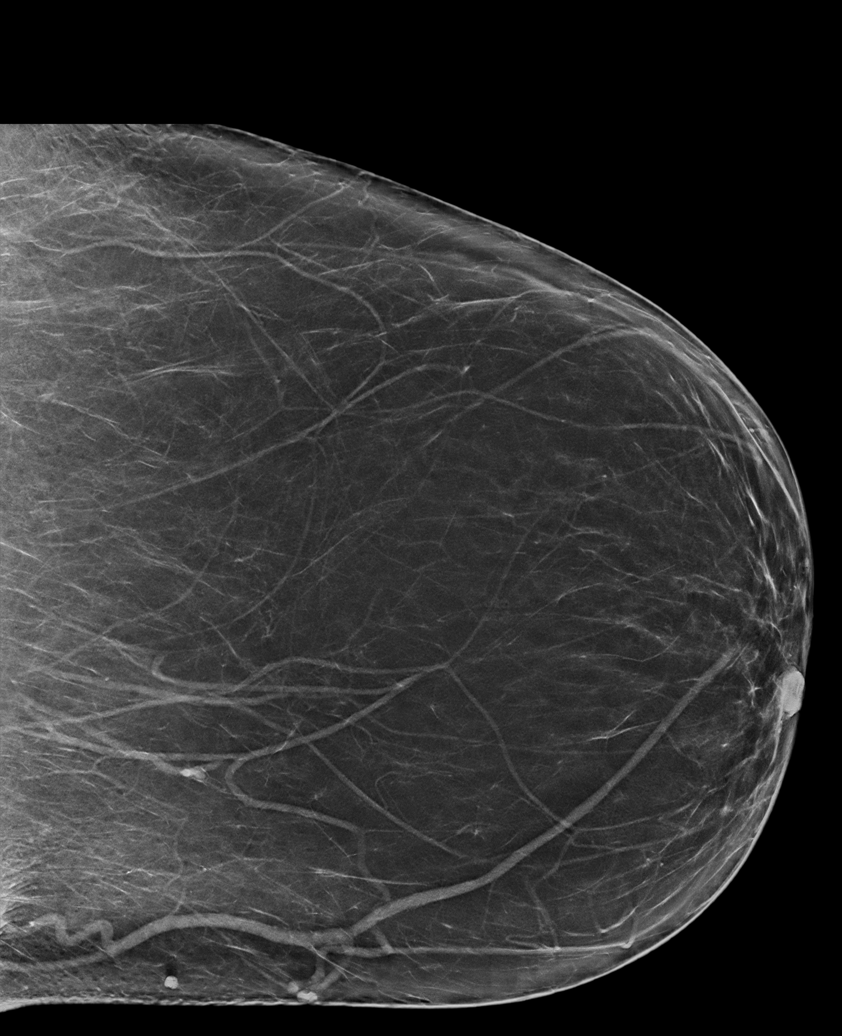

[R MLO synth-2D]
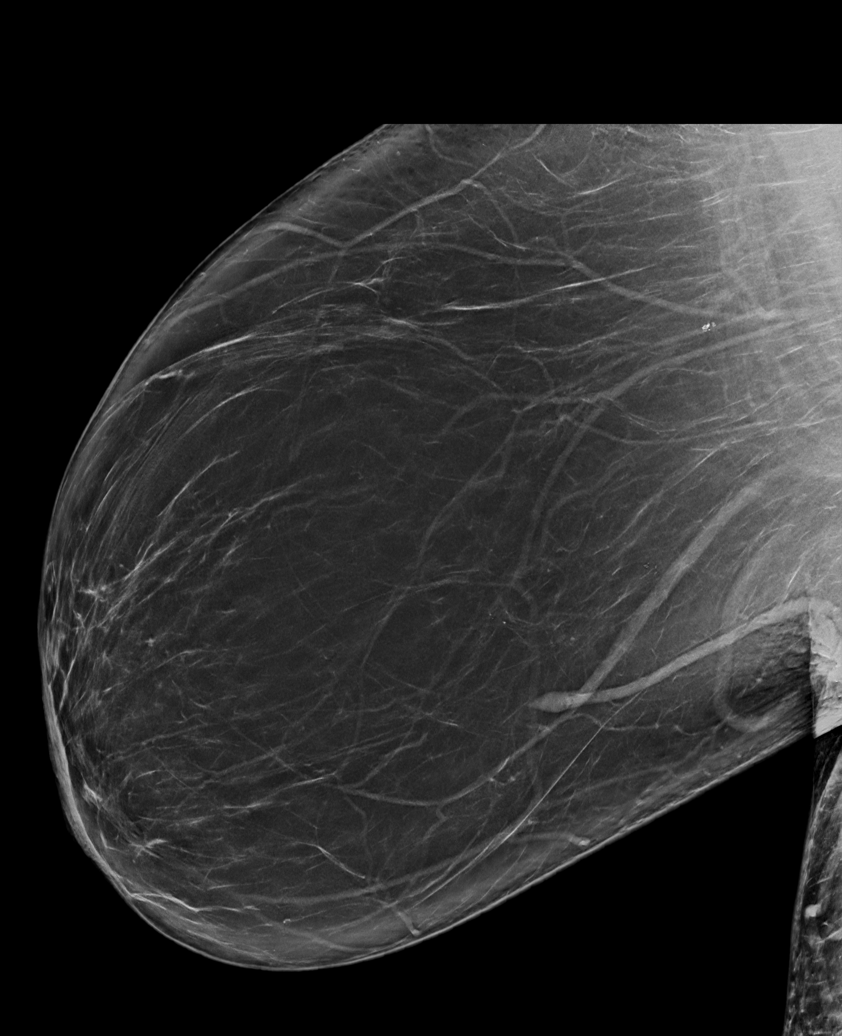

[L MLO synth-2D]
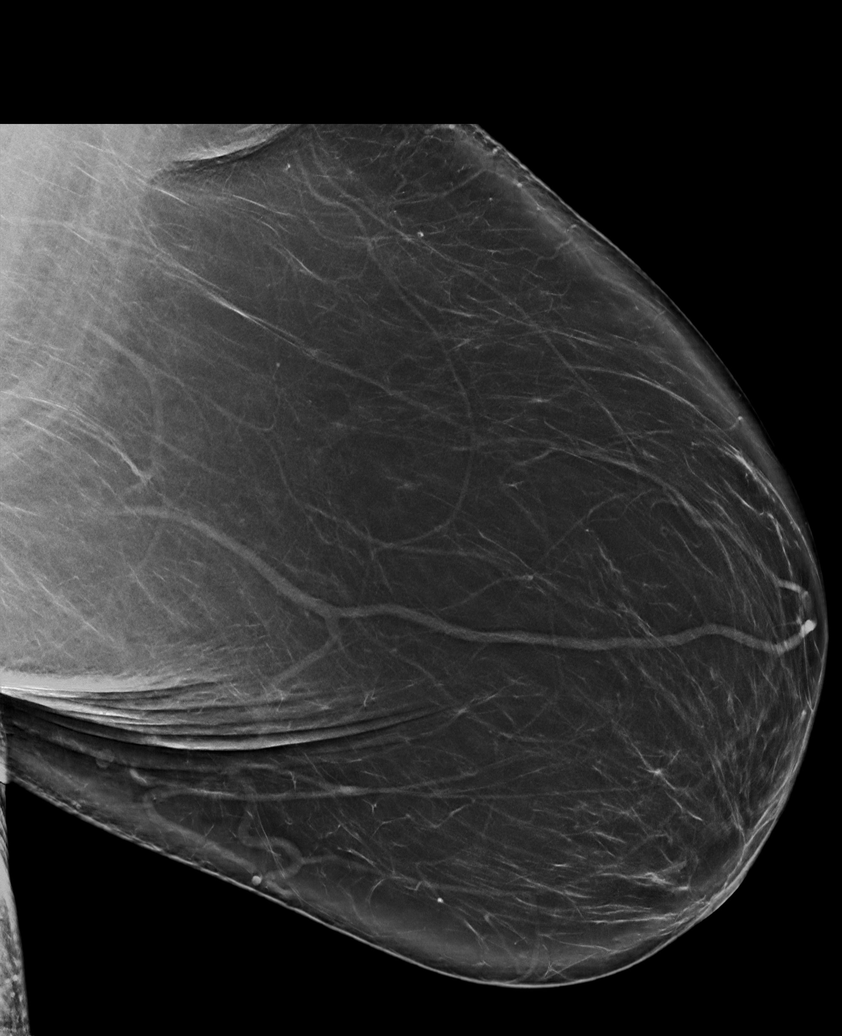

[R MLO tomo · tomo slice 47/92.0]
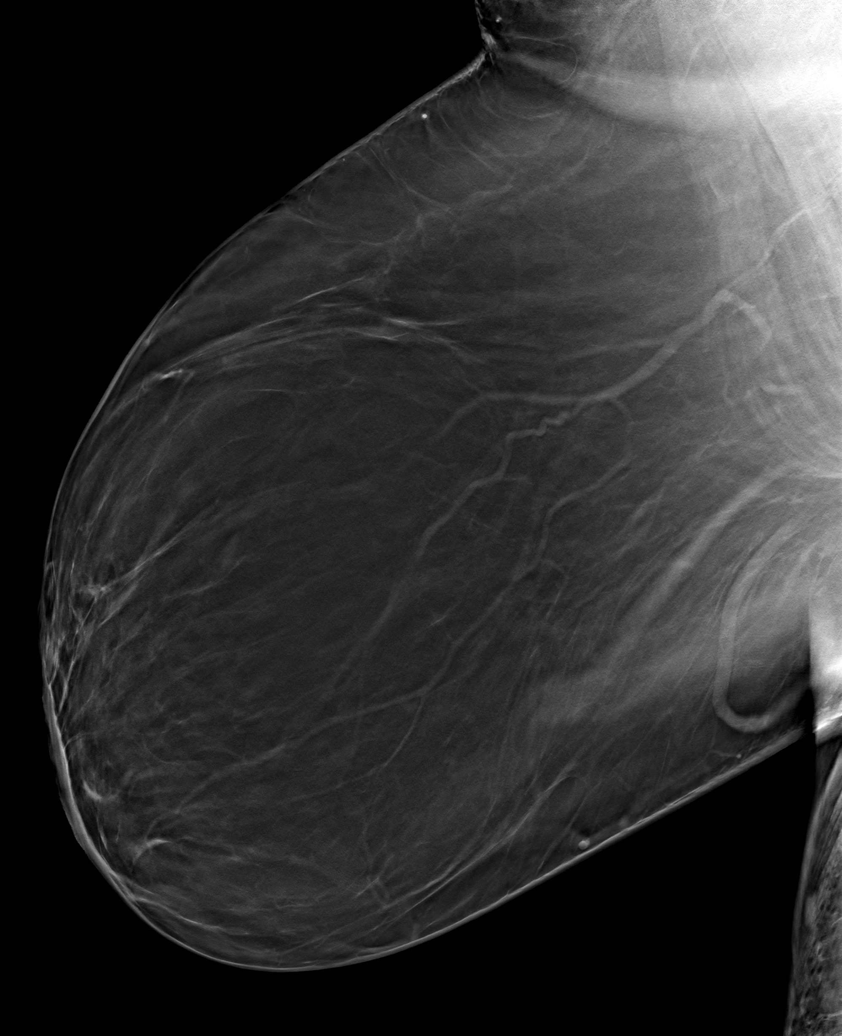

[6 of 30 positions shown; findings below may reference images not displayed]

ACR Breast Density Category b: There are scattered areas of
fibroglandular density.
FINDINGS: There are no findings suspicious for malignancy.
IMPRESSION: No mammographic evidence of malignancy. A result letter of this
screening mammogram will be mailed directly to the patient.

RECOMMENDATION:
Screening mammogram in one year. (Code:51-O-LD2)

BI-RADS CATEGORY  1: Negative.

## 2023-06-26 ENCOUNTER — Ambulatory Visit: Admitting: Podiatry

## 2023-07-17 ENCOUNTER — Ambulatory Visit (INDEPENDENT_AMBULATORY_CARE_PROVIDER_SITE_OTHER): Admitting: Podiatry

## 2023-07-17 DIAGNOSIS — Z91199 Patient's noncompliance with other medical treatment and regimen due to unspecified reason: Secondary | ICD-10-CM

## 2023-07-17 NOTE — Progress Notes (Signed)
 No show

## 2023-07-22 ENCOUNTER — Ambulatory Visit
Admission: RE | Admit: 2023-07-22 | Discharge: 2023-07-22 | Discharge status: Home / Self Care | Source: Ambulatory Visit

## 2023-07-22 ENCOUNTER — Other Ambulatory Visit: Payer: Self-pay

## 2023-07-22 VITALS — BP 148/84 | HR 83 | Temp 98.1°F | Resp 18

## 2023-07-22 DIAGNOSIS — M549 Dorsalgia, unspecified: Secondary | ICD-10-CM

## 2023-07-22 DIAGNOSIS — M25512 Pain in left shoulder: Secondary | ICD-10-CM | POA: Diagnosis not present

## 2023-07-22 DIAGNOSIS — M5430 Sciatica, unspecified side: Secondary | ICD-10-CM | POA: Diagnosis not present

## 2023-07-22 DIAGNOSIS — M25511 Pain in right shoulder: Secondary | ICD-10-CM

## 2023-07-22 MED ORDER — CYCLOBENZAPRINE HCL 10 MG PO TABS: 10.0000 mg | ORAL_TABLET | Freq: Three times a day (TID) | ORAL | 0 refills | Status: DC | PRN

## 2023-07-22 MED ORDER — DICLOFENAC SODIUM 1 % EX GEL: 4.0000 g | Freq: Four times a day (QID) | CUTANEOUS | 1 refills | Status: DC

## 2023-07-22 MED ORDER — LIDOCAINE 5 % EX PTCH: 1.0000 | MEDICATED_PATCH | CUTANEOUS | 0 refills | Status: AC

## 2023-07-22 NOTE — ED Triage Notes (Signed)
 Severe pain in lowerback side and pain in upper right thigh - Entered by patient  Pt reports "my sciatica is acting up and the muscle relaxers aren't helping". C/o pain in right leg and both shoulders- R leg pain for over a week, shoulder pain started this week

## 2023-07-22 NOTE — Discharge Instructions (Addendum)
 The back pain with radiating pain down the right leg is consistent with sciatica which you have had in the past.  This normally responds well to stronger anti-inflammatory such as steroids.  Unfortunately due to the elevated blood sugars, we will be unable to do steroids as this can cause an elevation in blood sugars however we can try the following: Flexeril  10 mg every 8 hours as needed for muscle spasms.  Use caution as this can cause drowsiness May try one of the 2: Voltaren topical cream 4 times daily to the affected area as needed for pain.  If this is not helpful then remove the Voltaren cream and may try lidocaine  5% patch to the affected area every 24 hours as needed for pain. At your follow-up appointment with your primary care doctor, please discuss whether a short burst of steroids might be indicated given your elevated blood sugars and see if they would be willing to prescribe these. For the bilateral shoulder pain, this is likely secondary to some AC joint arthritis.  Given this was an acute onset, may use the Voltaren cream 4 times daily as needed for pain and do light stretching.  May want to consider following up with orthopedics for this condition. Return to urgent care or PCP if symptoms worsen or fail to resolve.

## 2023-07-22 NOTE — ED Provider Notes (Signed)
 [name]    [name]: [id number] Arrival date & time: 07/22/23  1254      History   Chief Complaint Chief Complaint  Patient presents with   Back Pain    HPI Deborah Mcclain is a 53 y.o. female.   53 year old female who presents urgent care with complaints of lower back pain with radiating pain down the right leg.  She reports that this has been going on for about 2 weeks.  She has had this in the past as she has sciatica.  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  She is also having some discomfort in the upper back and mid back as well.  She also relates that she woke up this morning with stiffness and soreness in both shoulders.  She does relate that she was having to move her son a lot yesterday which may contribute to this.  She denies any history of problems with her shoulders.  She denies any specific injury to the shoulders.  She does have a follow-up with her PCP on the 28th of this month.  Unfortunately her blood sugars were 350 this morning which is about in the normal range for her at this time.  She denies any fevers, chills, dysuria, abdominal pain, hematuria, nausea, vomiting, loss of bowel or bladder, lower extremity paralysis.      Back Pain Associated symptoms: no abdominal pain, no chest pain, no dysuria and no fever     Past Medical History:  Diagnosis Date   Acanthosis nigricans 10/21/2021   Allergic rhinitis 10/21/2021   Asthma    Eczema    Hypertension    Type 2 diabetes mellitus (HCC) 12/2018    Patient Active Problem List   Diagnosis Date Noted   Class 3 severe obesity due to excess calories with serious comorbidity and body mass index (BMI) of 45.0 to 49.9 in adult 02/22/2023   Acute cholecystitis 06/01/2022   GERD (gastroesophageal reflux disease) 02/15/2022   Mild intermittent asthma 02/15/2022   Sciatica 07/12/2021   Chronic right-sided low back  pain with right-sided sciatica 07/12/2021   Pain due to onychomycosis of toenails of both feet 12/02/2020   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   Leucocytosis 09/10/2020   Vitamin D deficiency 09/10/2020   Hyperlipidemia 01/20/2020   Iron deficiency anemia 01/20/2020   Hyperglycemia due to diabetes mellitus (HCC) 01/09/2020   Anemia 12/03/2019   Abnormal uterine bleeding (AUB) 12/03/2019   History of blood transfusion 12/03/2019   Essential hypertension 04/16/2019   Type 2 diabetes mellitus with obesity (HCC) 04/16/2019   Morbid obesity with BMI of 50.0-59.9, adult (HCC) 04/16/2019   Menopausal vasomotor syndrome 04/16/2019   Type 2 diabetes mellitus with diabetic polyneuropathy, with long-term current use of insulin  (HCC) 04/16/2019    Past Surgical History:  Procedure Laterality Date   CESAREAN SECTION  05/25/1997   CESAREAN SECTION  03/30/2005   twins   CHOLECYSTECTOMY N/A 06/01/2022   Procedure: LAPAROSCOPIC CHOLECYSTECTOMY;  Surgeon: Dareen Ebbing, MD;  Location: [facility] OR;  Service: General;  Laterality: N/A;   ENDOMETRIAL BIOPSY  12/03/2019    OB History     Gravida  2   Para  2   Term  1   Preterm  1   AB      Living  3      SAB      IAB  Ectopic      Multiple  1   Live Births  3            Home Medications    Prior to Admission medications   Medication Sig Start Date End Date Taking? Authorizing Provider  albuterol  (VENTOLIN  HFA) 108 (90 Base) MCG/ACT inhaler Inhale 1-2 puffs into the lungs every 6 (six) hours as needed for wheezing or shortness of breath. 05/20/23  Yes Mound, Fairy Homer E, FNP  amLODipine  (NORVASC ) 10 MG tablet Take 1 tablet by mouth daily. 01/23/23  Yes [provider]  atorvastatin  (LIPITOR) 40 MG tablet Take 1 tablet (40 mg total) by mouth at bedtime. 01/12/20  Yes Ghimire, Estil Heman, MD  cyclobenzaprine  (FLEXERIL ) 10 MG tablet Take 1 tablet (10 mg total) by mouth 2 (two) times daily as needed for muscle spasms. 11/26/22  Yes  Vernestine Gondola, PA-C  famotidine (PEPCID) 20 MG tablet Take 20 mg by mouth daily. 09/05/19  Yes [provider]  insulin  aspart (NOVOLOG ) 100 UNIT/ML FlexPen 0-20 Units, Subcutaneous, 3 times daily with meals CBG < 70: Implement Hypoglycemia measures, call MD CBG 70 - 120: 0 units CBG 121 - 150: 3 units CBG 151 - 200: 4 units CBG 201 - 250: 7 units CBG 251 - 300: 11 units CBG 301 - 350: 15 units CBG 351 - 400: 20 units CBG > 400: call MD Patient taking differently: Inject 7 Units into the skin in the morning and at bedtime. 06/30/20  Yes Buena Carmine, NP  insulin  glargine-yfgn (SEMGLEE , YFGN,) 100 UNIT/ML Pen Inject 46 Units into the skin 2 (two) times daily. 46 units am and 36 units pm 06/23/23  Yes [provider]  levocetirizine (XYZAL ) 5 MG tablet Take 5 mg by mouth every evening.   Yes [provider]  metFORMIN  (GLUCOPHAGE ) 1000 MG tablet Take 1,000 mg by mouth 2 (two) times daily with a meal.    Yes [provider]  metoprolol  tartrate (LOPRESSOR ) 50 MG tablet Take 1 tablet (50 mg total) by mouth 2 (two) times daily. 01/12/20  Yes Ghimire, Estil Heman, MD  pregabalin  (LYRICA ) 25 MG capsule Take 25 mg by mouth 2 (two) times daily. RANOUT   Yes [provider]  triamcinolone  cream (KENALOG ) 0.1 % Apply 1 application topically 2 (two) times daily. 07/24/20  Yes Wieters, Hallie C, PA-C  valsartan (DIOVAN) 80 MG tablet Take 80 mg by mouth daily.   Yes [provider]  venlafaxine  XR (EFFEXOR -XR) 75 MG 24 hr capsule Take 1 capsule (75 mg total) by mouth daily. 12/26/19  Yes Leftwich-Kirby, Darren Em, CNM  ACCU-CHEK GUIDE test strip 1 each by Other route as needed. 11/15/22   [provider]  Accu-Chek Softclix Lancets lancets USE AS DIRECTED TO CHECK BLOOD SUGAR TWICE DAILY 09/23/22   [provider]  Adapalene-Benzoyl Peroxide 0.3-2.5 % GEL Apply 1 Application topically at bedtime.    [provider]  amLODipine  (NORVASC )  10 MG tablet Take 10 mg by mouth daily. 07/12/21   [provider]  aspirin-acetaminophen -caffeine (EXCEDRIN MIGRAINE) 250-250-65 MG tablet Take by mouth. Patient not taking: Reported on 07/22/2023 01/23/23   [provider]  Azelastine  HCl 137 MCG/SPRAY SOLN Place 1 spray into the nose 2 (two) times daily. Patient not taking: Reported on 07/22/2023 07/01/19   [provider]  B-D ULTRAFINE III SHORT PEN 31G X 8 MM MISC 2 (two) times daily. 09/23/22   [provider]  benzonatate  (TESSALON ) 100 MG  capsule Take 1 capsule (100 mg total) by mouth every 8 (eight) hours as needed for cough. Patient not taking: Reported on 07/22/2023 05/20/23   Dodson Freestone, FNP  cefdinir  (OMNICEF ) 300 MG capsule Take 1 capsule (300 mg total) by mouth 2 (two) times daily. Patient not taking: Reported on 07/22/2023 04/04/23   Afton Albright, MD  cetirizine  (ZYRTEC ) 10 MG tablet Take 1 tablet (10 mg total) by mouth daily. Patient taking differently: Take 10 mg by mouth 2 (two) times daily. 01/16/18   Jerri Morale A, FNP  Clobetasol Propionate 0.05 % shampoo Apply 1 Application topically every 14 (fourteen) days. 02/28/22   [provider]  doxycycline (VIBRAMYCIN) 100 MG capsule Take 100 mg by mouth 2 (two) times daily. Patient not taking: Reported on 07/22/2023 02/20/23   [provider]  empagliflozin (JARDIANCE) 10 MG TABS tablet Take 1 tablet by mouth daily. Patient not taking: Reported on 07/22/2023 01/23/23   [provider]  fluticasone  (FLONASE ) 50 MCG/ACT nasal spray Place 1 spray into both nostrils daily for 3 days. Patient not taking: Reported on 07/22/2023 12/03/21 06/01/22  Dodson Freestone, FNP  fluticasone  (FLONASE ) 50 MCG/ACT nasal spray Place 2 sprays into both nostrils daily. 06/11/19   [provider]  folic acid (FOLVITE) 1 MG tablet Take by mouth. Patient not taking: Reported on 07/22/2023 01/10/23   [provider]  folic acid (FOLVITE) 1  MG tablet Take 1 mg by mouth daily. 04/07/23   [provider]  folic acid (FOLVITE) 1 MG tablet Take 1 mg by mouth daily. 02/12/23   [provider]  LANTUS  SOLOSTAR 100 UNIT/ML Solostar Pen 46 units in am and 36 units in pm Patient not taking: Reported on 07/22/2023 12/03/21   Dodson Freestone, FNP  meloxicam  (MOBIC ) 15 MG tablet Take 1 tablet (15 mg total) by mouth daily. Patient not taking: Reported on 07/22/2023 11/29/22 11/29/23  Williams, Megan E, NP  metFORMIN  (GLUCOPHAGE ) 1000 MG tablet Take 1 tablet by mouth 2 (two) times daily. 01/20/22 01/20/23  [provider]  methotrexate (RHEUMATREX) 2.5 MG tablet TAKE 7 TABLETS BY MOUTH ONCE A WEEK Patient not taking: Reported on 07/22/2023 01/11/23   [provider]  naproxen  (NAPROSYN ) 375 MG tablet Take 375 mg by mouth 3 (three) times daily with meals. Patient not taking: Reported on 07/22/2023 09/29/22   [provider]  phenol (CHLORASEPTIC) 1.4 % LIQD Use as directed 1 spray in the mouth or throat as needed. 05/20/23   Dodson Freestone, FNP  rosuvastatin (CRESTOR) 5 MG tablet Take 5 mg by mouth daily. Patient not taking: Reported on 07/22/2023    [provider]  Semaglutide,0.25 or 0.5MG /DOS, 2 MG/3ML SOPN Inject into the skin. Patient not taking: Reported on 07/22/2023 02/20/23   [provider]  venlafaxine  XR (EFFEXOR -XR) 75 MG 24 hr capsule Take 75 mg by mouth daily with breakfast. 08/19/19   [provider]    Family History Family History  Problem Relation Age of Onset   Breast cancer Mother    Diabetes Mother    Hypertension Mother    Diabetes Maternal Grandmother     Social History Social History   Tobacco Use   Smoking status: Never   Smokeless tobacco: Never  Vaping Use   Vaping status: Never Used  Substance Use Topics   Alcohol use: Yes    Comment: occ   Drug use: Never     Allergies   Other   Review  of Systems Review of Systems  Constitutional:   Negative for chills and fever.  HENT:  Negative for ear pain and sore throat.   Eyes:  Negative for pain and visual disturbance.  Respiratory:  Negative for cough and shortness of breath.   Cardiovascular:  Negative for chest pain and palpitations.  Gastrointestinal:  Negative for abdominal pain and vomiting.  Genitourinary:  Negative for dysuria and hematuria.  Musculoskeletal:  Positive for back pain (With right leg radiation). Negative for arthralgias.       Bilateral shoulder pain, this morning  Skin:  Negative for color change and rash.  Neurological:  Negative for seizures and syncope.  All other systems reviewed and are negative.    Physical Exam Triage Vital Signs ED Triage Vitals  Encounter Vitals Group     BP 07/22/23 1324 (!) 148/84     Systolic BP Percentile --      Diastolic BP Percentile --      Pulse Rate 07/22/23 1324 83     Resp 07/22/23 1324 18     Temp 07/22/23 1324 98.1 F (36.7 C)     Temp Source 07/22/23 1324 Oral     SpO2 07/22/23 1324 98 %     Weight --      Height --      Head Circumference --      Peak Flow --      Pain Score 07/22/23 1312 7     Pain Loc --      Pain Education --      Exclude from Growth Chart --    No data found.  Updated Vital Signs BP (!) 148/84 (BP Location: Left Wrist)   Pulse 83   Temp 98.1 F (36.7 C) (Oral)   Resp 18   LMP 01/31/2020 (Approximate)   SpO2 98%   Visual Acuity Right Eye Distance:   Left Eye Distance:   Bilateral Distance:    Right Eye Near:   Left Eye Near:    Bilateral Near:     Physical Exam Vitals and nursing note reviewed.  Constitutional:      General: She is not in acute distress.    Appearance: She is well-developed.  HENT:     Head: Normocephalic and atraumatic.  Eyes:     Conjunctiva/sclera: Conjunctivae normal.  Cardiovascular:     Rate and Rhythm: Normal rate and regular rhythm.     Heart sounds: No murmur heard. Pulmonary:     Effort: Pulmonary effort is normal. No  respiratory distress.     Breath sounds: Normal breath sounds.  Abdominal:     Palpations: Abdomen is soft.     Tenderness: There is no abdominal tenderness.  Musculoskeletal:        General: No swelling.     Right shoulder: Tenderness (At the [facility] joint) present. Normal pulse.     Left shoulder: Tenderness (At the [facility] joint) present. Normal pulse.     Cervical back: Neck supple. Tenderness present. No deformity. Normal range of motion.     Thoracic back: Tenderness present. No deformity. Normal range of motion.     Lumbar back: Spasms and tenderness present. No deformity. Normal range of motion.     Comments: Right lower back pain with left leg elevation  Skin:    General: Skin is warm and dry.     Capillary Refill: Capillary refill takes less than 2 seconds.  Neurological:     Mental Status: She is alert.  Psychiatric:  Mood and Affect: Mood normal.      UC Treatments / Results  Labs (all labs ordered are listed, but only abnormal results are displayed) Labs Reviewed - No data to display  EKG   Radiology No results found.  Procedures Procedures (including critical care time)  Medications Ordered in UC Medications - No data to display  Initial Impression / Assessment and Plan / UC Course  I have reviewed the triage vital signs and the nursing notes.  Pertinent labs & imaging results that were available during my care of the patient were reviewed by me and considered in my medical decision making (see chart for details).     Back pain with sciatica  Acute pain of both shoulders   The back pain with radiating pain down the right leg is consistent with sciatica which you have had in the past.  This normally responds well to stronger anti-inflammatory such as steroids.  Unfortunately due to the elevated blood sugars, we will be unable to do steroids as this can cause an elevation in blood sugars however we can try the following: Flexeril  10 mg every 8 hours as  needed for muscle spasms.  Use caution as this can cause drowsiness May try one of the 2: Voltaren topical cream 4 times daily to the affected area as needed for pain.  If this is not helpful then remove the Voltaren cream and may try lidocaine  5% patch to the affected area every 24 hours as needed for pain. At your follow-up appointment with your primary care doctor, please discuss whether a short burst of steroids might be indicated given your elevated blood sugars and see if they would be willing to prescribe these. For the bilateral shoulder pain, this is likely secondary to some AC joint arthritis.  Given this was an acute onset, may use the Voltaren cream 4 times daily as needed for pain and do light stretching.  May want to consider following up with orthopedics for this condition. Return to urgent care or PCP if symptoms worsen or fail to resolve.    Final Clinical Impressions(s) / UC Diagnoses   Final diagnoses:  None   Discharge Instructions   None    ED Prescriptions   None    PDMP not reviewed this encounter.   Kreg Pesa, [place] 07/22/23 1354

## 2023-09-08 ENCOUNTER — Other Ambulatory Visit: Payer: Self-pay | Admitting: Nurse Practitioner

## 2023-09-08 DIAGNOSIS — K760 Fatty (change of) liver, not elsewhere classified: Secondary | ICD-10-CM

## 2023-09-08 DIAGNOSIS — R748 Abnormal levels of other serum enzymes: Secondary | ICD-10-CM

## 2023-09-14 ENCOUNTER — Other Ambulatory Visit

## 2023-09-21 ENCOUNTER — Other Ambulatory Visit

## 2023-09-27 ENCOUNTER — Ambulatory Visit
Admission: RE | Admit: 2023-09-27 | Discharge: 2023-09-27 | Disposition: A | Discharge status: Home / Self Care | Source: Ambulatory Visit | Attending: Nurse Practitioner | Admitting: Nurse Practitioner

## 2023-09-27 ENCOUNTER — Ambulatory Visit
Admission: RE | Admit: 2023-09-27 | Discharge: 2023-09-27 | Disposition: A | Discharge status: Home / Self Care | Source: Ambulatory Visit | Attending: Family Medicine | Admitting: Family Medicine

## 2023-09-27 VITALS — BP 126/77 | HR 90 | Temp 98.0°F | Resp 18

## 2023-09-27 DIAGNOSIS — M5431 Sciatica, right side: Secondary | ICD-10-CM

## 2023-09-27 DIAGNOSIS — R748 Abnormal levels of other serum enzymes: Secondary | ICD-10-CM

## 2023-09-27 DIAGNOSIS — M5441 Lumbago with sciatica, right side: Secondary | ICD-10-CM

## 2023-09-27 DIAGNOSIS — K760 Fatty (change of) liver, not elsewhere classified: Secondary | ICD-10-CM

## 2023-09-27 MED ORDER — KETOROLAC TROMETHAMINE 30 MG/ML IJ SOLN
30.0000 mg | Freq: Once | INTRAMUSCULAR | Status: AC
  Administered 2023-09-27: 30 mg via INTRAMUSCULAR

## 2023-09-27 MED ORDER — CYCLOBENZAPRINE HCL 10 MG PO TABS
ORAL_TABLET | ORAL | 0 refills | Status: DC
Start: 2023-09-27 — End: 2023-12-31

## 2023-09-27 NOTE — ED Triage Notes (Signed)
 Pt states she is having sharp, shooting pain from right lower back down to her right leg x 2 weeks. Taking tylenol .

## 2023-09-27 NOTE — ED Provider Notes (Signed)
 [facility]   [id number] 09/27/23 Arrival Time: 1016  ASSESSMENT & PLAN:  1. Acute right-sided low back pain with right-sided sciatica   2. Sciatica of right side    Able to ambulate here and hemodynamically stable. No indication for imaging of back at this time given no trauma and normal neurological exam. Discussed.  Meds ordered this encounter  Medications   cyclobenzaprine  (FLEXERIL ) 10 MG tablet    Sig: Take 1 tablet by mouth 3 times daily as needed for muscle spasm. Warning: May cause drowsiness.    Dispense:  21 tablet    Refill:  0   ketorolac  (TORADOL ) 30 MG/ML injection 30 mg   Work/school excuse note: declined. Medication sedation precautions given. Encourage ROM/movement as tolerated.  Recommend:  Follow-up Information     Schedule an appointment as soon as possible for a visit  with Buck Search, PA-C.   Specialty: Physician Assistant Why: For follow up. Contact information: [address] [place] [address] [phone]                 Reviewed expectations re: course of current medical issues. Questions answered. Outlined signs and symptoms indicating need for more acute intervention. Patient verbalized understanding. After Visit Summary given.   SUBJECTIVE: History from: patient.  Deborah Mcclain is a 53 y.o. female who presents with complaint of {Blank single:22868} {DESC; LEFT/RIGHT/BI:19206} {Desc; upper/lower/both/mid:30149} back discomfort. Onset {abrupt, gradual:20671}. First noted {$Time; disease onset (duration):[phone]}. Injury/trama: {YES/NO/WILD RJMID:81418}. History of back problems requiring medical care: {Rare/occasional/frequent:30190}. Pain described as {Desc; back pain quality:31200} and {PAIN RADIATION:23213}. Aggravating factors: {MSK AGGRAVATING ALLEVIATING FACTORS:22975}. Alleviating factors: {MSK AGGRAVATING ALLEVIATING FACTORS:22975}. Progressive LE weakness or saddle anesthesia: {NONE DEFAULTED:18576}.  Extremity sensation changes or weakness: {NONE DEFAULTED:18576}. Ambulatory {With/Without:20273::without} difficulty. Normal bowel/bladder habits: {YES/NO/WILD CARDS:18581}; {With/Without:20273::without} urinary retention. Normal PO intake without n/v. No associated abdominal pain/n/v. Tylenol  without much relief.   OBJECTIVE:  Vitals:   09/27/23 1039  BP: 126/77  Pulse: 90  Resp: 18  Temp: 98 F (36.7 C)  TempSrc: Oral  SpO2: 97%    General appearance: alert; no distress HEENT: [place]; AT Neck: supple with FROM; without midline tenderness CV: regular Lungs: unlabored respirations; speaks full sentences without difficulty Abdomen: soft, non-tender; non-distended Back: {PALPATION TENDERNESS:23017}; FROM at waist; bruising: {INTENSITY:23016}; {With/Without:20273} midline tenderness Extremities: without edema; symmetrical without gross deformities; normal ROM of bilateral LE Skin: warm and dry Neurologic: normal gait; normal sensation, strength, and reflexes of bilateral LE Psychological: alert and cooperative; normal mood and affect    Allergies  Allergen Reactions   Other Itching    Ketchup    Past Medical History:  Diagnosis Date   Acanthosis nigricans 10/21/2021   Allergic rhinitis 10/21/2021   Asthma    Eczema    Hypertension    Type 2 diabetes mellitus (HCC) 12/2018   Social History   Socioeconomic History   Marital status: Divorced    Spouse name: Not on file   Number of children: Not on file   Years of education: Not on file   Highest education level: Not on file  Occupational History   Not on file  Tobacco Use   Smoking status: Never   Smokeless tobacco: Never  Vaping Use   Vaping status: Never Used  Substance and Sexual Activity   Alcohol use: Yes    Comment: occ   Drug use: Never   Sexual activity: Yes    Partners: Male    Birth control/protection: Surgical  Other Topics  Concern   Not on file  Social History Narrative   Not on file    Social Drivers of Health   Financial Resource Strain: Not at Risk (01/23/2023)   Received from [employer]    How hard is it for you to pay for the very basics like food, housing, heating, medical care, and medications?: 1  Food Insecurity: Low Risk  (09/08/2023)   Received from [facility]   Hunger Vital Sign    Within the past 12 months, you worried that your food would run out before you got money to buy more: Never true    Within the past 12 months, the food you bought just didn't last and you didn't have money to get more. : Never true  Transportation Needs: No Transportation Needs (09/08/2023)   Received from [facility]   Transportation    In the past 12 months, has lack of reliable transportation kept you from medical appointments, meetings, work or from getting things needed for daily living? : No  Physical Activity: Not on File (07/01/2021)   Received from [facility]   Physical Activity    Physical Activity: 0  Stress: Not on File (07/01/2021)   Received from [facility]   Stress    Stress: 0  Social Connections: Not on File (11/15/2022)   Received from [facility]   Social Connections    Connectedness: 0  Intimate Partner Violence: Not At Risk (06/01/2022)   Humiliation, Afraid, Rape, and Kick questionnaire    Fear of Current or Ex-Partner: No    Emotionally Abused: No    Physically Abused: No    Sexually Abused: No   Family History  Problem Relation Age of Onset   Breast cancer Mother    Diabetes Mother    Hypertension Mother    Diabetes Maternal Grandmother    Past Surgical History:  Procedure Laterality Date   CESAREAN SECTION  05/25/1997   CESAREAN SECTION  03/30/2005   twins   CHOLECYSTECTOMY N/A 06/01/2022   Procedure: LAPAROSCOPIC CHOLECYSTECTOMY;  Surgeon: Belinda Cough, MD;  Location: [facility] OR;  Service: General;  Laterality: N/A;   ENDOMETRIAL BIOPSY  12/03/2019

## 2023-09-27 NOTE — Discharge Instructions (Addendum)
 HOME CARE INSTRUCTIONS: For many people, back pain returns. Since low back pain is rarely dangerous, it is often a condition that people can learn to manage on their own. Please remain active. It is stressful on the back to sit or stand in one place. Do not sit, drive, or stand in one place for more than 30 minutes at a time. Take short walks on level surfaces as soon as pain allows. Try to increase the length of time you walk each day. Do not stay in bed. Resting more than 1 or 2 days can delay your recovery. Do not avoid exercise or work. Your body is made to move. It is not dangerous to be active, even though your back may hurt. Your back will likely heal faster if you return to being active before your pain is gone. Over-the-counter medicines to reduce pain and inflammation are often the most helpful.  SEEK MEDICAL CARE IF: You have pain that is not relieved with rest or medicine. You have pain that does not improve in 1 week. You have new symptoms. You are generally not feeling well.  SEEK IMMEDIATE MEDICAL CARE IF: You have pain that radiates from your back into your legs. You develop new bowel or bladder control problems. You have unusual weakness or numbness in your arms or legs. You develop nausea or vomiting. You develop abdominal pain. You feel faint.  Meds ordered this encounter  Medications   cyclobenzaprine  (FLEXERIL ) 10 MG tablet    Sig: Take 1 tablet by mouth 3 times daily as needed for muscle spasm. Warning: May cause drowsiness.    Dispense:  21 tablet    Refill:  0   ketorolac  (TORADOL ) 30 MG/ML injection 30 mg

## 2023-11-03 ENCOUNTER — Other Ambulatory Visit: Payer: Self-pay

## 2023-11-03 ENCOUNTER — Emergency Department (HOSPITAL_COMMUNITY)

## 2023-11-03 ENCOUNTER — Emergency Department (HOSPITAL_COMMUNITY): Admission: EM | Admit: 2023-11-03 | Discharge: 2023-11-03 | Disposition: A | Discharge status: Home / Self Care

## 2023-11-03 DIAGNOSIS — W208XXA Other cause of strike by thrown, projected or falling object, initial encounter: Secondary | ICD-10-CM | POA: Diagnosis not present

## 2023-11-03 DIAGNOSIS — Z794 Long term (current) use of insulin: Secondary | ICD-10-CM | POA: Diagnosis not present

## 2023-11-03 DIAGNOSIS — S9031XA Contusion of right foot, initial encounter: Secondary | ICD-10-CM | POA: Insufficient documentation

## 2023-11-03 DIAGNOSIS — S99921A Unspecified injury of right foot, initial encounter: Secondary | ICD-10-CM | POA: Diagnosis present

## 2023-11-03 DIAGNOSIS — Z7982 Long term (current) use of aspirin: Secondary | ICD-10-CM | POA: Insufficient documentation

## 2023-11-03 MED ORDER — MELOXICAM 15 MG PO TBDP: 1.0000 | ORAL_TABLET | Freq: Every day | ORAL | 0 refills | Status: DC

## 2023-11-03 NOTE — ED Provider Notes (Signed)
 [place] EMERGENCY DEPARTMENT AT [facility] Provider Note   [name]: [id number] Arrival date & time: 11/03/23  1331     Patient presents with: Foot Injury   Deborah Mcclain is a 53 y.o. female.   53 year old female presents the emergency department today with right foot pain.  The patient states that she dropped something on her foot out of the refrigerator a few days ago.  States this was roughly 1 week ago.  Fell on her right foot.  She has been having pain with this since.  She came to the ER because this was persistent.  She states that the pain is throbbing and over the fifth MTP.  Denies any other injuries.  She states that multiple things fell out of the refrigerator so she is unsure what actually hit her.  She came to the ER today for further evaluation.  Has been taking Tylenol  home for pain which helps for brief periods of time but then the pain comes back.   Foot Injury      Prior to Admission medications   Medication Sig Start Date End Date Taking? Authorizing Provider  Meloxicam  15 MG TBDP Take 1 tablet by mouth daily. 11/03/23  Yes Ula Prentice SAUNDERS, MD  ACCU-CHEK GUIDE test strip 1 each by Other route as needed. 11/15/22   [provider]  Accu-Chek Softclix Lancets lancets USE AS DIRECTED TO CHECK BLOOD SUGAR TWICE DAILY 09/23/22   [provider]  Adapalene-Benzoyl Peroxide 0.3-2.5 % GEL Apply 1 Application topically at bedtime.    [provider]  albuterol  (VENTOLIN  HFA) 108 (90 Base) MCG/ACT inhaler Inhale 1-2 puffs into the lungs every 6 (six) hours as needed for wheezing or shortness of breath. 05/20/23   Hazen Darryle BRAVO, FNP  amLODipine  (NORVASC ) 10 MG tablet Take 10 mg by mouth daily. 07/12/21   [provider]  amLODipine  (NORVASC ) 10 MG tablet Take 1 tablet by mouth daily. 01/23/23   [provider]  aspirin-acetaminophen -caffeine (EXCEDRIN MIGRAINE) 250-250-65 MG tablet Take by mouth. Patient not taking: Reported on  07/22/2023 01/23/23   [provider]  atorvastatin  (LIPITOR) 40 MG tablet Take 1 tablet (40 mg total) by mouth at bedtime. 01/12/20   Ghimire, Donalda HERO, MD  B-D ULTRAFINE III SHORT PEN 31G X 8 MM MISC 2 (two) times daily. 09/23/22   [provider]  benzonatate  (TESSALON ) 100 MG capsule Take 1 capsule (100 mg total) by mouth every 8 (eight) hours as needed for cough. Patient not taking: Reported on 07/22/2023 05/20/23   Hazen Darryle BRAVO, FNP  cetirizine  (ZYRTEC ) 10 MG tablet Take 1 tablet (10 mg total) by mouth daily. Patient taking differently: Take 10 mg by mouth 2 (two) times daily. 01/16/18   Adah Corning A, FNP  Clobetasol Propionate 0.05 % shampoo Apply 1 Application topically every 14 (fourteen) days. 02/28/22   [provider]  cyclobenzaprine  (FLEXERIL ) 10 MG tablet Take 1 tablet by mouth 3 times daily as needed for muscle spasm. Warning: May cause drowsiness. 09/27/23   Rolinda Rogue, MD  famotidine (PEPCID) 20 MG tablet Take 20 mg by mouth daily. 09/05/19   [provider]  fluticasone  (FLONASE ) 50 MCG/ACT nasal spray Place 1 spray into both nostrils daily for 3 days. Patient not taking: Reported on 07/22/2023 12/03/21 06/01/22  Mound, Haley E, FNP  fluticasone  (FLONASE ) 50 MCG/ACT nasal spray Place 2 sprays into both nostrils daily. 06/11/19   [provider]  folic acid (FOLVITE) 1 MG tablet  Take by mouth. Patient not taking: Reported on 07/22/2023 01/10/23   [provider]  folic acid (FOLVITE) 1 MG tablet Take 1 mg by mouth daily. 04/07/23   [provider]  folic acid (FOLVITE) 1 MG tablet Take 1 mg by mouth daily. 02/12/23   [provider]  insulin  aspart (NOVOLOG ) 100 UNIT/ML FlexPen 0-20 Units, Subcutaneous, 3 times daily with meals CBG < 70: Implement Hypoglycemia measures, call MD CBG 70 - 120: 0 units CBG 121 - 150: 3 units CBG 151 - 200: 4 units CBG 201 - 250: 7 units CBG 251 - 300: 11 units CBG 301 - 350: 15 units CBG  351 - 400: 20 units CBG > 400: call MD Patient taking differently: Inject 7 Units into the skin in the morning and at bedtime. 06/30/20   Arloa Suzen RAMAN, NP  insulin  glargine-yfgn (SEMGLEE , YFGN,) 100 UNIT/ML Pen Inject 46 Units into the skin 2 (two) times daily. 46 units am and 36 units pm 06/23/23   [provider]  LANTUS  SOLOSTAR 100 UNIT/ML Solostar Pen 46 units in am and 36 units in pm 12/03/21   Mound, Haley E, FNP  levocetirizine (XYZAL ) 5 MG tablet Take 5 mg by mouth every evening.    [provider]  lidocaine  (LIDODERM ) 5 % Place 1 patch onto the skin daily. Remove & Discard patch within 12 hours or as directed by MD 07/22/23   Teresa Almarie LABOR, PA-C  metFORMIN  (GLUCOPHAGE ) 1000 MG tablet Take 1,000 mg by mouth 2 (two) times daily with a meal.     [provider]  metFORMIN  (GLUCOPHAGE ) 1000 MG tablet Take 1 tablet by mouth 2 (two) times daily. 01/20/22 01/20/23  [provider]  methotrexate (RHEUMATREX) 2.5 MG tablet TAKE 7 TABLETS BY MOUTH ONCE A WEEK Patient not taking: Reported on 07/22/2023 01/11/23   [provider]  metoprolol  tartrate (LOPRESSOR ) 50 MG tablet Take 1 tablet (50 mg total) by mouth 2 (two) times daily. 01/12/20   Ghimire, Donalda HERO, MD  phenol (CHLORASEPTIC) 1.4 % LIQD Use as directed 1 spray in the mouth or throat as needed. 05/20/23   Hazen Darryle BRAVO, FNP  pregabalin  (LYRICA ) 25 MG capsule Take 25 mg by mouth 2 (two) times daily. RANOUT    [provider]  rosuvastatin (CRESTOR) 5 MG tablet Take 5 mg by mouth daily.    [provider]  Semaglutide,0.25 or 0.5MG /DOS, 2 MG/3ML SOPN Inject into the skin. Patient not taking: Reported on 07/22/2023 02/20/23   [provider]  triamcinolone  cream (KENALOG ) 0.1 % Apply 1 application topically 2 (two) times daily. 07/24/20   Wieters, Hallie C, PA-C  valsartan (DIOVAN) 80 MG tablet Take 80 mg by mouth daily.    [provider]  venlafaxine  XR  (EFFEXOR -XR) 75 MG 24 hr capsule Take 1 capsule (75 mg total) by mouth daily. 12/26/19   Leftwich-Kirby, Olam LABOR, CNM  venlafaxine  XR (EFFEXOR -XR) 75 MG 24 hr capsule Take 75 mg by mouth daily with breakfast. 08/19/19   [provider]    Allergies: Other and Pollen extract    Review of Systems  Musculoskeletal:        Right foot pain  All other systems reviewed and are negative.   Updated Vital Signs BP (!) 168/99   Pulse (!) 108   Temp 98 F (36.7 C) (Oral)   Resp 18   LMP 01/31/2020 (Approximate)   SpO2 98%   Physical Exam Vitals and nursing note  reviewed.   General: No acute distress MSK: The patient is tender over the fifth metatarsal in the middle, no tenderness over the ankle is noted, normal range of motion noted, compartments are soft Neuro: Normal strength with ankle flexion and extension, able to move all toes on the right but Vascular: 2+ DP pulses bilaterally with normal capillary refill noted  (all labs ordered are listed, but only abnormal results are displayed) Labs Reviewed - No data to display  EKG: None  Radiology: DG Foot Complete Right Result Date: 11/03/2023 CLINICAL DATA:  Right foot pain. EXAM: RIGHT FOOT COMPLETE - 3+ VIEW COMPARISON:  None Available. FINDINGS: No acute fracture or dislocation. No aggressive osseous lesion. Calcaneal spur noted along the Achilles tendon and Plantar aponeurosis attachment sites. There are mild diffuse degenerative changes of imaged joints. No focal soft tissue swelling. No radiopaque foreign bodies. IMPRESSION: No acute osseous abnormality of the right foot. Electronically Signed   By: Ree Molt M.D.   On: 11/03/2023 14:52     Procedures   Medications Ordered in the ED - No data to display                                  Medical Decision Making 53 year old female presents emergency department today with right foot pain after dropping something on her foot.  Will obtain x-ray to evaluate for  fracture.  This may be due to contusion.  I will reevaluate for ultimate disposition.  The patient's x-ray is negative.  She will be discharged and treated with NSAIDs with return precautions.  Amount and/or Complexity of Data Reviewed Radiology: ordered.  Risk Prescription drug management.        Final diagnoses:  Contusion of right foot, initial encounter    ED Discharge Orders          Ordered    Meloxicam  15 MG TBDP  Daily        11/03/23 1458               Ula Prentice SAUNDERS, MD 11/03/23 [phone]

## 2023-11-03 NOTE — Discharge Instructions (Signed)
 Your x-ray did not show any new findings.  Please take the meloxicam  daily for pain and swelling.  Keep your foot elevated.  Follow-up with your doctor and return to the ER for worsening symptoms.

## 2023-11-03 NOTE — ED Triage Notes (Signed)
 Pt has c/o right foot pain going on for 2 weeks after she dropped something on it

## 2023-11-23 ENCOUNTER — Telehealth: Payer: Self-pay | Admitting: Podiatry

## 2023-12-04 ENCOUNTER — Other Ambulatory Visit: Payer: Self-pay | Admitting: Family

## 2023-12-04 DIAGNOSIS — Z1231 Encounter for screening mammogram for malignant neoplasm of breast: Secondary | ICD-10-CM

## 2023-12-06 ENCOUNTER — Encounter: Payer: Self-pay | Admitting: Podiatry

## 2023-12-06 ENCOUNTER — Ambulatory Visit: Admitting: Podiatry

## 2023-12-06 ENCOUNTER — Ambulatory Visit

## 2023-12-06 DIAGNOSIS — R0989 Other specified symptoms and signs involving the circulatory and respiratory systems: Secondary | ICD-10-CM | POA: Diagnosis not present

## 2023-12-06 DIAGNOSIS — S9031XA Contusion of right foot, initial encounter: Secondary | ICD-10-CM

## 2023-12-06 NOTE — Progress Notes (Signed)
  Subjective:  Patient ID: Deborah Mcclain, female    DOB: 11/27/1970,   MRN: [id number]  Chief Complaint  Patient presents with   Foot Injury    I dropped a container of margarine on my foot about a month ago.  My foot is still throbbing.  I feel a little bump.  It's black, bruised.  (Stated she had xrays done at [place])    53 y.o. female presents for concern of right foot pain. Relates about  month ago a container of margarine fell on her foot. She was seen in the ED and X-rays were negative. She has continued to have pain.  Patient is diabetic and last A1c was  Lab Results  Component Value Date   HGBA1C 14.0 (H) 01/09/2020   .   PCP:  Duwaine Annabella SAILOR, FNP     . Denies any other pedal complaints. Denies n/v/f/c.   Past Medical History:  Diagnosis Date   Acanthosis nigricans 10/21/2021   Allergic rhinitis 10/21/2021   Asthma    Eczema    Hypertension    Type 2 diabetes mellitus (HCC) 12/2018    Objective:  Physical Exam: Vascular: DP/PT pulses 1/4 bilateral. CFT <3 seconds. Normal hair growth on digits. No edema.  Skin. No lacerations or abrasions bilateral feet.  Musculoskeletal: MMT 5/5 bilateral lower extremities in DF, PF, Inversion and Eversion. Deceased ROM in DF of ankle joint. Tender over distal dorsal fourth and fifth metatarsal base area. Eccymosis and skin changesnoted in the area.  Neurological: Sensation intact to light touch.   Assessment:   1. Contusion of right foot, initial encounter   2. Diminished pulse      Plan:  Patient was evaluated and treated and all questions answered. -Xrays reviewed. No acute fracture or kislcoations noted.  -Discussed treatement options for contusion of foot; risks, alternatives, and benefits explained. -Dispensed surgical shoe. Patient to wear at all times and instructed on use -Recommend protection, rest, ice, elevation daily until symptoms improve -Rx pain med/anti-inflammatories as needed -ABIs ordered given DM  and concern for circulation given exam. -Patient to return to office in 4 weeks for serial x-rays to assess healing  or sooner if condition worsens.   Asberry Failing, DPM

## 2023-12-14 ENCOUNTER — Ambulatory Visit (HOSPITAL_COMMUNITY)
Admission: RE | Admit: 2023-12-14 | Discharge: 2023-12-14 | Disposition: A | Discharge status: Home / Self Care | Source: Ambulatory Visit | Attending: Podiatry | Admitting: Podiatry

## 2023-12-14 DIAGNOSIS — R0989 Other specified symptoms and signs involving the circulatory and respiratory systems: Secondary | ICD-10-CM | POA: Diagnosis not present

## 2023-12-14 LAB — VAS US ABI WITH/WO TBI
Left ABI: 1.2
Right ABI: 1.24

## 2023-12-18 ENCOUNTER — Ambulatory Visit

## 2023-12-22 ENCOUNTER — Ambulatory Visit

## 2023-12-24 ENCOUNTER — Other Ambulatory Visit: Payer: Self-pay

## 2023-12-24 ENCOUNTER — Ambulatory Visit
Admission: RE | Admit: 2023-12-24 | Discharge: 2023-12-24 | Disposition: A | Discharge status: Home / Self Care | Source: Ambulatory Visit | Attending: Internal Medicine | Admitting: Internal Medicine

## 2023-12-24 ENCOUNTER — Ambulatory Visit (INDEPENDENT_AMBULATORY_CARE_PROVIDER_SITE_OTHER)

## 2023-12-24 VITALS — BP 141/101 | HR 101 | Temp 98.4°F | Resp 18

## 2023-12-24 DIAGNOSIS — M79671 Pain in right foot: Secondary | ICD-10-CM

## 2023-12-24 MED ORDER — DEXAMETHASONE SOD PHOSPHATE PF 10 MG/ML IJ SOLN
10.0000 mg | Freq: Once | INTRAMUSCULAR | Status: AC
  Administered 2023-12-24: 10 mg via INTRAMUSCULAR

## 2023-12-24 NOTE — Discharge Instructions (Addendum)
 X-ray of the right foot done today.  Final evaluation by the radiologist shows there is no acute fracture present.  They do indicate that the swelling from August has improved and resolved.  There is evidence of degenerative changes in the foot which could be causing some of the residual pain.  Other sources of the pain could be possible neuropathy.  The discoloration of the skin is likely secondary to iron staining from the bruising from the original injury.  Due to the degenerative changes we could try a steroid injection today to see if this will calm down inflammation.  We will do Decadron  10 mg injection here in clinic today.  You may see a slight bump in your blood sugars for the next 24 to 48 hours.  Please keep a close eye on this.  Keep your appointment with podiatry to discuss your symptoms and see if there are any other options for improving your symptoms.

## 2023-12-24 NOTE — ED Triage Notes (Signed)
 Pt sts continued issues with right foot since dropping heavy object on foot 6 weeks ago; pt sts has had imaging that was negative and was also seen by pediatry for same; pt sts hx of DM and some complications from that as well

## 2023-12-24 NOTE — ED Provider Notes (Signed)
 [name]    [name]: [id number] Arrival date & time: 12/24/23  1028      History   Chief Complaint Chief Complaint  Patient presents with   Foot Pain    Pain in right upper foot and bruising - Entered by patient    HPI Bentlie Withem is a 53 y.o. female.   53 year old female presents urgent care with complaints of persistent right foot pain after dropping a container on her foot.  She was seen on August 22 in the emergency room secondary to this.  X-ray done at that time was negative.  She continued to have significant pain therefore she followed up with podiatry on September 24.  At that time they reviewed her images and agreed with no fracture.  They ordered ankle-brachial indexes due to her history of diabetes.  Ankle-brachial indexes were done on October 2 and did not show any significant decrease in perfusion.  She has a follow-up scheduled on October 22 with the podiatrist but came in today because she continues to have pain and discoloration of the foot.  She also notes some discolored areas on the remainder of her leg that she has had for years and feels that everything may be connected.  She has not had any reinjury to the area.  She reports that even the lightest of touch can cause her pain.  It does hurt to walk.     Foot Pain Pertinent negatives include no chest pain, no abdominal pain and no shortness of breath.    Past Medical History:  Diagnosis Date   Acanthosis nigricans 10/21/2021   Allergic rhinitis 10/21/2021   Asthma    Eczema    Hypertension    Type 2 diabetes mellitus (HCC) 12/2018    Patient Active Problem List   Diagnosis Date Noted   Class 3 severe obesity due to excess calories with serious comorbidity and body mass index (BMI) of 45.0 to 49.9 in adult [facility]) 02/22/2023   Acute cholecystitis 06/01/2022   GERD (gastroesophageal reflux disease) 02/15/2022   Mild intermittent asthma 02/15/2022   Sciatica 07/12/2021   Chronic  right-sided low back pain with right-sided sciatica 07/12/2021   Pain due to onychomycosis of toenails of both feet 12/02/2020   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   Leucocytosis 09/10/2020   Vitamin D deficiency 09/10/2020   Hyperlipidemia 01/20/2020   Iron deficiency anemia 01/20/2020   Hyperglycemia due to diabetes mellitus (HCC) 01/09/2020   Anemia 12/03/2019   Abnormal uterine bleeding (AUB) 12/03/2019   History of blood transfusion 12/03/2019   Essential hypertension 04/16/2019   Type 2 diabetes mellitus with obesity 04/16/2019   Morbid obesity with BMI of 50.0-59.9, adult (HCC) 04/16/2019   Menopausal vasomotor syndrome 04/16/2019   Type 2 diabetes mellitus with diabetic polyneuropathy, with long-term current use of insulin  (HCC) 04/16/2019    Past Surgical History:  Procedure Laterality Date   CESAREAN SECTION  05/25/1997   CESAREAN SECTION  03/30/2005   twins   CHOLECYSTECTOMY N/A 06/01/2022   Procedure: LAPAROSCOPIC CHOLECYSTECTOMY;  Surgeon: Belinda Cough, MD;  Location: [facility] OR;  Service: General;  Laterality: N/A;   ENDOMETRIAL BIOPSY  12/03/2019    OB History     Gravida  2   Para  2   Term  1   Preterm  1   AB      Living  3      SAB      IAB      Ectopic  Multiple  1   Live Births  3            Home Medications    Prior to Admission medications   Medication Sig Start Date End Date Taking? Authorizing Provider  ACCU-CHEK GUIDE test strip 1 each by Other route as needed. 11/15/22   [provider]  Accu-Chek Softclix Lancets lancets USE AS DIRECTED TO CHECK BLOOD SUGAR TWICE DAILY 09/23/22   [provider]  Adapalene-Benzoyl Peroxide 0.3-2.5 % GEL Apply 1 Application topically at bedtime.    [provider]  albuterol  (VENTOLIN  HFA) 108 (90 Base) MCG/ACT inhaler Inhale 1-2 puffs into the lungs every 6 (six) hours as needed for wheezing or shortness of breath. 05/20/23   Hazen Darryle BRAVO, FNP  amLODipine  (NORVASC ) 10 MG  tablet Take 10 mg by mouth daily. 07/12/21   [provider]  amLODipine  (NORVASC ) 10 MG tablet Take 1 tablet by mouth daily. 01/23/23   [provider]  aspirin-acetaminophen -caffeine (EXCEDRIN MIGRAINE) 250-250-65 MG tablet Take by mouth. Patient not taking: Reported on 12/06/2023 01/23/23   [provider]  atorvastatin  (LIPITOR) 40 MG tablet Take 1 tablet (40 mg total) by mouth at bedtime. 01/12/20   Ghimire, Donalda HERO, MD  B-D ULTRAFINE III SHORT PEN 31G X 8 MM MISC 2 (two) times daily. 09/23/22   [provider]  benzonatate  (TESSALON ) 100 MG capsule Take 1 capsule (100 mg total) by mouth every 8 (eight) hours as needed for cough. Patient not taking: Reported on 12/06/2023 05/20/23   Hazen Darryle BRAVO, FNP  cetirizine  (ZYRTEC ) 10 MG tablet Take 1 tablet (10 mg total) by mouth daily. Patient taking differently: Take 10 mg by mouth 2 (two) times daily. 01/16/18   Adah Corning A, FNP  Clobetasol Propionate 0.05 % shampoo Apply 1 Application topically every 14 (fourteen) days. 02/28/22   [provider]  cyclobenzaprine  (FLEXERIL ) 10 MG tablet Take 1 tablet by mouth 3 times daily as needed for muscle spasm. Warning: May cause drowsiness. 09/27/23   Rolinda Rogue, MD  famotidine (PEPCID) 20 MG tablet Take 20 mg by mouth daily. 09/05/19   [provider]  fluticasone  (FLONASE ) 50 MCG/ACT nasal spray Place 1 spray into both nostrils daily for 3 days. 12/03/21 12/06/23  Hazen Darryle BRAVO, FNP  fluticasone  (FLONASE ) 50 MCG/ACT nasal spray Place 2 sprays into both nostrils daily. 06/11/19   [provider]  folic acid (FOLVITE) 1 MG tablet Take by mouth. 01/10/23   [provider]  folic acid (FOLVITE) 1 MG tablet Take 1 mg by mouth daily. 04/07/23   [provider]  folic acid (FOLVITE) 1 MG tablet Take 1 mg by mouth daily. 02/12/23   [provider]  insulin  aspart (NOVOLOG ) 100 UNIT/ML FlexPen 0-20 Units, Subcutaneous, 3 times daily  with meals CBG < 70: Implement Hypoglycemia measures, call MD CBG 70 - 120: 0 units CBG 121 - 150: 3 units CBG 151 - 200: 4 units CBG 201 - 250: 7 units CBG 251 - 300: 11 units CBG 301 - 350: 15 units CBG 351 - 400: 20 units CBG > 400: call MD Patient taking differently: Inject 7 Units into the skin in the morning and at bedtime. 06/30/20   Arloa Suzen RAMAN, NP  insulin  glargine-yfgn (SEMGLEE , YFGN,) 100 UNIT/ML Pen Inject 46 Units into the skin 2 (two) times daily. 46 units am and 36 units pm 06/23/23   [provider]  LANTUS  SOLOSTAR 100 UNIT/ML Solostar Pen 46 units  in am and 36 units in pm 12/03/21   Mound, Haley E, FNP  levocetirizine (XYZAL ) 5 MG tablet Take 5 mg by mouth every evening.    [provider]  lidocaine  (LIDODERM ) 5 % Place 1 patch onto the skin daily. Remove & Discard patch within 12 hours or as directed by MD 07/22/23   Teresa Almarie LABOR, PA-C  Meloxicam  15 MG TBDP Take 1 tablet by mouth daily. 11/03/23   Ula Prentice SAUNDERS, MD  metFORMIN  (GLUCOPHAGE ) 1000 MG tablet Take 1,000 mg by mouth 2 (two) times daily with a meal.     [provider]  metFORMIN  (GLUCOPHAGE ) 1000 MG tablet Take 1 tablet by mouth 2 (two) times daily. 01/20/22 12/06/23  [provider]  methotrexate (RHEUMATREX) 2.5 MG tablet TAKE 7 TABLETS BY MOUTH ONCE A WEEK Patient not taking: Reported on 12/06/2023 01/11/23   [provider]  metoprolol  tartrate (LOPRESSOR ) 50 MG tablet Take 1 tablet (50 mg total) by mouth 2 (two) times daily. 01/12/20   Ghimire, Donalda HERO, MD  phenol (CHLORASEPTIC) 1.4 % LIQD Use as directed 1 spray in the mouth or throat as needed. 05/20/23   Hazen Darryle BRAVO, FNP  pregabalin  (LYRICA ) 25 MG capsule Take 25 mg by mouth 2 (two) times daily. RANOUT    [provider]  rosuvastatin (CRESTOR) 5 MG tablet Take 5 mg by mouth daily.    [provider]  Semaglutide,0.25 or 0.5MG /DOS, 2 MG/3ML SOPN Inject into the skin. 02/20/23   [provider]  triamcinolone  cream (KENALOG ) 0.1 % Apply 1 application topically 2 (two) times daily. 07/24/20   Wieters, Hallie C, PA-C  valsartan (DIOVAN) 80 MG tablet Take 80 mg by mouth daily.    [provider]  venlafaxine  XR (EFFEXOR -XR) 75 MG 24 hr capsule Take 1 capsule (75 mg total) by mouth daily. 12/26/19   Leftwich-Kirby, Olam LABOR, CNM  venlafaxine  XR (EFFEXOR -XR) 75 MG 24 hr capsule Take 75 mg by mouth daily with breakfast. 08/19/19   [provider]    Family History Family History  Problem Relation Age of Onset   Breast cancer Mother    Diabetes Mother    Hypertension Mother    Diabetes Maternal Grandmother     Social History Social History   Tobacco Use   Smoking status: Never   Smokeless tobacco: Never  Vaping Use   Vaping status: Never Used  Substance Use Topics   Alcohol use: Not Currently    Comment: occ   Drug use: Yes    Types: Marijuana    Comment: eat edibles     Allergies   Other and Pollen extract   Review of Systems Review of Systems  Constitutional:  Negative for chills and fever.  HENT:  Negative for ear pain and sore throat.   Eyes:  Negative for pain and visual disturbance.  Respiratory:  Negative for cough and shortness of breath.   Cardiovascular:  Negative for chest pain and palpitations.  Gastrointestinal:  Negative for abdominal pain and vomiting.  Genitourinary:  Negative for dysuria and hematuria.  Musculoskeletal:  Negative for arthralgias and back pain.       Right foot pain and discoloration  Skin:  Negative for color change and rash.  Neurological:  Negative for seizures and syncope.  All other systems reviewed and are negative.    Physical Exam Triage Vital Signs ED Triage Vitals [12/24/23 1049]  Encounter Vitals Group     BP (!) 141/101  Girls Systolic BP Percentile      Girls Diastolic BP Percentile      Boys Systolic BP Percentile      Boys Diastolic BP Percentile      Pulse Rate (!) 101      Resp 18     Temp 98.4 F (36.9 C)     Temp Source Oral     SpO2 95 %     Weight      Height      Head Circumference      Peak Flow      Pain Score 6     Pain Loc      Pain Education      Exclude from Growth Chart    No data found.  Updated Vital Signs BP (!) 141/101 (BP Location: Left Arm)   Pulse (!) 101   Temp 98.4 F (36.9 C) (Oral)   Resp 18   LMP 01/31/2020 (Approximate)   SpO2 95%   Visual Acuity Right Eye Distance:   Left Eye Distance:   Bilateral Distance:    Right Eye Near:   Left Eye Near:    Bilateral Near:     Physical Exam Vitals and nursing note reviewed.  Constitutional:      General: She is not in acute distress.    Appearance: She is well-developed.  HENT:     Head: Normocephalic and atraumatic.  Eyes:     Conjunctiva/sclera: Conjunctivae normal.  Cardiovascular:     Rate and Rhythm: Normal rate and regular rhythm.     Pulses:          Dorsalis pedis pulses are 2+ on the right side.       Posterior tibial pulses are 2+ on the right side.     Heart sounds: No murmur heard. Pulmonary:     Effort: Pulmonary effort is normal. No respiratory distress.     Breath sounds: Normal breath sounds.  Abdominal:     Palpations: Abdomen is soft.     Tenderness: There is no abdominal tenderness.  Musculoskeletal:        General: No swelling.     Cervical back: Neck supple.     Right foot: No deformity.       Feet:  Skin:    General: Skin is warm and dry.     Capillary Refill: Capillary refill takes less than 2 seconds.  Neurological:     Mental Status: She is alert.  Psychiatric:        Mood and Affect: Mood normal.      UC Treatments / Results  Labs (all labs ordered are listed, but only abnormal results are displayed) Labs Reviewed - No data to display  EKG   Radiology DG Foot Complete Right Result Date: 12/24/2023 EXAM: 3 OR MORE VIEW(S) XRAY OF THE RIGHT FOOT 12/24/2023 11:32:25 AM COMPARISON: Right foot series 11/03/2023.  CLINICAL HISTORY: 53 year old female. Right foot pain after dropping container 6 weeks ago. History of diabetes mellitus. FINDINGS: BONES AND JOINTS: No acute fracture or dislocation identified about the right foot. 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 First MTP joint degeneration is stable, moderate. SOFT TISSUES: Soft tissue swelling in the foot has regressed. IMPRESSION: 1. No acute osseous abnormality identified about the right foot. Soft tissue swelling has regressed since August. 2. Advanced midfoot degeneration and calcaneal spurring. Electronically signed by: Helayne Hurst MD 12/24/2023 11:53 AM EDT RP Workstation: [id number]  Procedures Procedures (including critical care time)  Medications Ordered in UC Medications  dexamethasone  (DECADRON ) injection 10 mg (has no administration in time range)    Initial Impression / Assessment and Plan / UC Course  I have reviewed the triage vital signs and the nursing notes.  Pertinent labs & imaging results that were available during my care of the patient were reviewed by me and considered in my medical decision making (see chart for details).     Right foot pain - Plan: DG Foot Complete Right, DG Foot Complete Right   X-ray of the right foot done today.  Final evaluation by the radiologist shows there is no acute fracture present.  They do indicate that the swelling from August has improved and resolved.  There is evidence of degenerative changes in the foot which could be causing some of the residual pain.  Other sources of the pain could be possible neuropathy.  The discoloration of the skin is likely secondary to iron staining from the bruising from the original injury.  Due to the degenerative changes we could try a steroid injection today to see if this will calm down inflammation.  We will do Decadron  10 mg injection here in clinic today.  You may see a slight bump in your blood  sugars for the next 24 to 48 hours.  Please keep a close eye on this.  Keep your appointment with podiatry to discuss your symptoms and see if there are any other options for improving your symptoms.  Final Clinical Impressions(s) / UC Diagnoses   Final diagnoses:  Right foot pain     Discharge Instructions      X-ray of the right foot done today.  Final evaluation by the radiologist shows there is no acute fracture present.  They do indicate that the swelling from August has improved and resolved.  There is evidence of degenerative changes in the foot which could be causing some of the residual pain.  Other sources of the pain could be possible neuropathy.  The discoloration of the skin is likely secondary to iron staining from the bruising from the original injury.  Due to the degenerative changes we could try a steroid injection today to see if this will calm down inflammation.  We will do Decadron  10 mg injection here in clinic today.  You may see a slight bump in your blood sugars for the next 24 to 48 hours.  Please keep a close eye on this.  Keep your appointment with podiatry to discuss your symptoms and see if there are any other options for improving your symptoms.     ED Prescriptions   None    PDMP not reviewed this encounter.   Teresa Almarie LABOR, PA-C 12/24/23 1210

## 2023-12-31 ENCOUNTER — Ambulatory Visit

## 2023-12-31 ENCOUNTER — Ambulatory Visit
Admission: EM | Admit: 2023-12-31 | Discharge: 2023-12-31 | Disposition: A | Discharge status: Home / Self Care | Attending: Emergency Medicine | Admitting: Emergency Medicine

## 2023-12-31 ENCOUNTER — Encounter: Payer: Self-pay | Admitting: Emergency Medicine

## 2023-12-31 DIAGNOSIS — J069 Acute upper respiratory infection, unspecified: Secondary | ICD-10-CM

## 2023-12-31 DIAGNOSIS — J4521 Mild intermittent asthma with (acute) exacerbation: Secondary | ICD-10-CM

## 2023-12-31 DIAGNOSIS — R52 Pain, unspecified: Secondary | ICD-10-CM

## 2023-12-31 LAB — POC COVID19/FLU A&B COMBO
Covid Antigen, POC: NEGATIVE
Influenza A Antigen, POC: NEGATIVE
Influenza B Antigen, POC: NEGATIVE

## 2023-12-31 MED ORDER — ALBUTEROL SULFATE (2.5 MG/3ML) 0.083% IN NEBU
2.5000 mg | INHALATION_SOLUTION | Freq: Once | RESPIRATORY_TRACT | Status: AC
  Administered 2023-12-31: 2.5 mg via RESPIRATORY_TRACT

## 2023-12-31 MED ORDER — GUAIFENESIN ER 600 MG PO TB12: 1200.0000 mg | ORAL_TABLET | Freq: Two times a day (BID) | ORAL | 0 refills | Status: AC

## 2023-12-31 MED ORDER — ALBUTEROL SULFATE HFA 108 (90 BASE) MCG/ACT IN AERS: 2.0000 | INHALATION_SPRAY | Freq: Four times a day (QID) | RESPIRATORY_TRACT | 1 refills | Status: AC | PRN

## 2023-12-31 MED ORDER — PROMETHAZINE-DM 6.25-15 MG/5ML PO SYRP: 5.0000 mL | ORAL_SOLUTION | Freq: Four times a day (QID) | ORAL | 0 refills | Status: DC | PRN

## 2023-12-31 MED ORDER — CYCLOBENZAPRINE HCL 10 MG PO TABS: 10.0000 mg | ORAL_TABLET | Freq: Every evening | ORAL | 0 refills | Status: DC | PRN

## 2023-12-31 NOTE — ED Triage Notes (Signed)
 Pt presents c/o URI x 5 days. Pt states,  I started to feel bad on Tuesday. By Friday the sxs were worse. I tried Tylenol  cold and flu but it didn't help much.  Pt denies emesis and diarrhea.

## 2023-12-31 NOTE — ED Provider Notes (Addendum)
 [name]    [name]: [id number] Arrival date & time: 12/31/23  1147      History   Chief Complaint Chief Complaint  Patient presents with   Headache   Otalgia   Generalized Body Aches   Cough    HPI Deborah Mcclain is a 53 y.o. female.  5 day history of URI symptoms Having nasal congestion, cough, headache, ear pain Also body aches that keep her awake  Cough causing pain in the chest, and with deep breath she feels tightness Denies shortness of breath or wheezing No known fevers  Has tried tylenol  cold & flu a few times Sick contacts likely - daughter in college, and patient in this urgent care 2 days before symptoms began   Asthma history  T2DM. A1c 2 months ago was 14.5 HTN  Past Medical History:  Diagnosis Date   Acanthosis nigricans 10/21/2021   Allergic rhinitis 10/21/2021   Asthma    Eczema    Hypertension    Type 2 diabetes mellitus (HCC) 12/2018    Patient Active Problem List   Diagnosis Date Noted   Class 3 severe obesity due to excess calories with serious comorbidity and body mass index (BMI) of 45.0 to 49.9 in adult [facility]) 02/22/2023   Acute cholecystitis 06/01/2022   GERD (gastroesophageal reflux disease) 02/15/2022   Mild intermittent asthma 02/15/2022   Sciatica 07/12/2021   Chronic right-sided low back pain with right-sided sciatica 07/12/2021   Pain due to onychomycosis of toenails of both feet 12/02/2020   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   Leucocytosis 09/10/2020   Vitamin D deficiency 09/10/2020   Hyperlipidemia 01/20/2020   Iron deficiency anemia 01/20/2020   Hyperglycemia due to diabetes mellitus (HCC) 01/09/2020   Anemia 12/03/2019   Abnormal uterine bleeding (AUB) 12/03/2019   History of blood transfusion 12/03/2019   Essential hypertension 04/16/2019   Type 2 diabetes mellitus with obesity 04/16/2019   Morbid obesity with BMI of 50.0-59.9, adult (HCC) 04/16/2019   Menopausal vasomotor syndrome 04/16/2019   Type 2  diabetes mellitus with diabetic polyneuropathy, with long-term current use of insulin  (HCC) 04/16/2019    Past Surgical History:  Procedure Laterality Date   CESAREAN SECTION  05/25/1997   CESAREAN SECTION  03/30/2005   twins   CHOLECYSTECTOMY N/A 06/01/2022   Procedure: LAPAROSCOPIC CHOLECYSTECTOMY;  Surgeon: Belinda Cough, MD;  Location: [facility] OR;  Service: General;  Laterality: N/A;   ENDOMETRIAL BIOPSY  12/03/2019    OB History     Gravida  2   Para  2   Term  1   Preterm  1   AB      Living  3      SAB      IAB      Ectopic      Multiple  1   Live Births  3            Home Medications    Prior to Admission medications   Medication Sig Start Date End Date Taking? Authorizing Provider  albuterol  (VENTOLIN  HFA) 108 (90 Base) MCG/ACT inhaler Inhale 2 puffs into the lungs every 6 (six) hours as needed for wheezing or shortness of breath. 12/31/23  Yes Lamonta Cypress, Asberry, PA-C  cyclobenzaprine  (FLEXERIL ) 10 MG tablet Take 1 tablet (10 mg total) by mouth at bedtime as needed for muscle spasms. 12/31/23  Yes Mazin Emma, Asberry, PA-C  guaiFENesin (MUCINEX) 600 MG 12 hr tablet Take 2 tablets (1,200 mg total) by mouth 2 (two) times daily. 12/31/23  Yes Damali Broadfoot, Asberry, PA-C  promethazine -dextromethorphan (PROMETHAZINE -DM) 6.25-15 MG/5ML syrup Take 5 mLs by mouth 4 (four) times daily as needed for cough. 12/31/23  Yes Kyndle Schlender, Asberry, PA-C  ACCU-CHEK GUIDE test strip 1 each by Other route as needed. 11/15/22   [provider]  Accu-Chek Softclix Lancets lancets USE AS DIRECTED TO CHECK BLOOD SUGAR TWICE DAILY 09/23/22   [provider]  Adapalene-Benzoyl Peroxide 0.3-2.5 % GEL Apply 1 Application topically at bedtime.    [provider]  amLODipine  (NORVASC ) 10 MG tablet Take 10 mg by mouth daily. 07/12/21   [provider]  amLODipine  (NORVASC ) 10 MG tablet Take 1 tablet by mouth daily. 01/23/23   [provider]   aspirin-acetaminophen -caffeine (EXCEDRIN MIGRAINE) 250-250-65 MG tablet Take by mouth. Patient not taking: Reported on 12/06/2023 01/23/23   [provider]  atorvastatin  (LIPITOR) 40 MG tablet Take 1 tablet (40 mg total) by mouth at bedtime. 01/12/20   Ghimire, Donalda HERO, MD  B-D ULTRAFINE III SHORT PEN 31G X 8 MM MISC 2 (two) times daily. 09/23/22   [provider]  benzonatate  (TESSALON ) 100 MG capsule Take 1 capsule (100 mg total) by mouth every 8 (eight) hours as needed for cough. Patient not taking: Reported on 12/06/2023 05/20/23   Hazen Darryle BRAVO, FNP  cetirizine  (ZYRTEC ) 10 MG tablet Take 1 tablet (10 mg total) by mouth daily. Patient taking differently: Take 10 mg by mouth 2 (two) times daily. 01/16/18   Adah Corning A, FNP  Clobetasol Propionate 0.05 % shampoo Apply 1 Application topically every 14 (fourteen) days. 02/28/22   [provider]  famotidine (PEPCID) 20 MG tablet Take 20 mg by mouth daily. 09/05/19   [provider]  fluticasone  (FLONASE ) 50 MCG/ACT nasal spray Place 1 spray into both nostrils daily for 3 days. 12/03/21 12/06/23  Hazen Darryle BRAVO, FNP  fluticasone  (FLONASE ) 50 MCG/ACT nasal spray Place 2 sprays into both nostrils daily. 06/11/19   [provider]  folic acid (FOLVITE) 1 MG tablet Take by mouth. 01/10/23   [provider]  folic acid (FOLVITE) 1 MG tablet Take 1 mg by mouth daily. 04/07/23   [provider]  folic acid (FOLVITE) 1 MG tablet Take 1 mg by mouth daily. 02/12/23   [provider]  insulin  aspart (NOVOLOG ) 100 UNIT/ML FlexPen 0-20 Units, Subcutaneous, 3 times daily with meals CBG < 70: Implement Hypoglycemia measures, call MD CBG 70 - 120: 0 units CBG 121 - 150: 3 units CBG 151 - 200: 4 units CBG 201 - 250: 7 units CBG 251 - 300: 11 units CBG 301 - 350: 15 units CBG 351 - 400: 20 units CBG > 400: call MD Patient taking differently: Inject 7 Units into the skin in the morning and at bedtime.  06/30/20   Arloa Suzen RAMAN, NP  insulin  glargine-yfgn (SEMGLEE , YFGN,) 100 UNIT/ML Pen Inject 46 Units into the skin 2 (two) times daily. 46 units am and 36 units pm 06/23/23   [provider]  LANTUS  SOLOSTAR 100 UNIT/ML Solostar Pen 46 units in am and 36 units in pm 12/03/21   Mound, Haley E, FNP  levocetirizine (XYZAL ) 5 MG tablet Take 5 mg by mouth every evening.    [provider]  lidocaine  (LIDODERM ) 5 % Place 1 patch onto the skin daily. Remove & Discard patch within 12 hours or as directed by MD 07/22/23   Teresa Almarie LABOR, PA-C  Meloxicam  15 MG TBDP Take 1 tablet by mouth  daily. 11/03/23   Ula Prentice SAUNDERS, MD  metFORMIN  (GLUCOPHAGE ) 1000 MG tablet Take 1,000 mg by mouth 2 (two) times daily with a meal.     [provider]  metFORMIN  (GLUCOPHAGE ) 1000 MG tablet Take 1 tablet by mouth 2 (two) times daily. 01/20/22 12/06/23  [provider]  methotrexate (RHEUMATREX) 2.5 MG tablet TAKE 7 TABLETS BY MOUTH ONCE A WEEK Patient not taking: Reported on 12/06/2023 01/11/23   [provider]  metoprolol  tartrate (LOPRESSOR ) 50 MG tablet Take 1 tablet (50 mg total) by mouth 2 (two) times daily. 01/12/20   Ghimire, Donalda HERO, MD  phenol (CHLORASEPTIC) 1.4 % LIQD Use as directed 1 spray in the mouth or throat as needed. 05/20/23   Hazen Darryle BRAVO, FNP  pregabalin  (LYRICA ) 25 MG capsule Take 25 mg by mouth 2 (two) times daily. RANOUT    [provider]  rosuvastatin (CRESTOR) 5 MG tablet Take 5 mg by mouth daily.    [provider]  Semaglutide,0.25 or 0.5MG /DOS, 2 MG/3ML SOPN Inject into the skin. 02/20/23   [provider]  triamcinolone  cream (KENALOG ) 0.1 % Apply 1 application topically 2 (two) times daily. 07/24/20   Wieters, Hallie C, PA-C  valsartan (DIOVAN) 80 MG tablet Take 80 mg by mouth daily.    [provider]  venlafaxine  XR (EFFEXOR -XR) 75 MG 24 hr capsule Take 1 capsule (75 mg total) by mouth daily. 12/26/19    Leftwich-Kirby, Olam LABOR, CNM  venlafaxine  XR (EFFEXOR -XR) 75 MG 24 hr capsule Take 75 mg by mouth daily with breakfast. 08/19/19   [provider]    Family History Family History  Problem Relation Age of Onset   Breast cancer Mother    Diabetes Mother    Hypertension Mother    Diabetes Maternal Grandmother     Social History Social History   Tobacco Use   Smoking status: Never    Passive exposure: Never   Smokeless tobacco: Never  Vaping Use   Vaping status: Never Used  Substance Use Topics   Alcohol use: Not Currently    Comment: occ   Drug use: Yes    Types: Marijuana    Comment: eat edibles     Allergies   Other and Pollen extract   Review of Systems Review of Systems  As per HPI  Physical Exam Triage Vital Signs ED Triage Vitals  Encounter Vitals Group     BP 12/31/23 1201 (!) 148/101     Girls Systolic BP Percentile --      Girls Diastolic BP Percentile --      Boys Systolic BP Percentile --      Boys Diastolic BP Percentile --      Pulse Rate 12/31/23 1201 96     Resp 12/31/23 1201 20     Temp 12/31/23 1201 98 F (36.7 C)     Temp Source 12/31/23 1201 Oral     SpO2 12/31/23 1201 98 %     Weight 12/31/23 1225 270 lb (122.5 kg)     Height --      Head Circumference --      Peak Flow --      Pain Score 12/31/23 1224 10     Pain Loc --      Pain Education --      Exclude from Growth Chart --    No data found.  Updated Vital Signs BP (!) 137/96 (BP Location: Left Arm)   Pulse 86  Temp 98 F (36.7 C) (Oral)   Resp 18   Wt 270 lb (122.5 kg)   LMP 01/31/2020 (Approximate)   SpO2 98%   BMI 46.35 kg/m    Physical Exam Vitals and nursing note reviewed.  Constitutional:      Appearance: She is not ill-appearing.  HENT:     Right Ear: Tympanic membrane and ear canal normal.     Left Ear: Tympanic membrane and ear canal normal.     Nose: No rhinorrhea.     Mouth/Throat:     Mouth: Mucous membranes are moist.     Pharynx:  Oropharynx is clear. No posterior oropharyngeal erythema.  Eyes:     Conjunctiva/sclera: Conjunctivae normal.  Cardiovascular:     Rate and Rhythm: Normal rate and regular rhythm.     Pulses: Normal pulses.     Heart sounds: Normal heart sounds.  Pulmonary:     Effort: Pulmonary effort is normal. No respiratory distress.     Breath sounds: Normal breath sounds. No wheezing, rhonchi or rales.  Musculoskeletal:     Cervical back: Normal range of motion.  Lymphadenopathy:     Cervical: No cervical adenopathy.  Skin:    General: Skin is warm and dry.  Neurological:     Mental Status: She is alert and oriented to person, place, and time.      UC Treatments / Results  Labs (all labs ordered are listed, but only abnormal results are displayed) Labs Reviewed  POC COVID19/FLU A&B COMBO - Normal    EKG   Radiology No results found.  Procedures Procedures (including critical care time)  Medications Ordered in UC Medications  albuterol  (PROVENTIL ) (2.5 MG/3ML) 0.083% nebulizer solution 2.5 mg (2.5 mg Nebulization Given 12/31/23 1417)    Initial Impression / Assessment and Plan / UC Course  I have reviewed the triage vital signs and the nursing notes.  Pertinent labs & imaging results that were available during my care of the patient were reviewed by me and considered in my medical decision making (see chart for details).  During triage patient had requested a covid and flu test. This was obtained by CMA although not under protocol and would not have been recommended by this provider. Of note the tests were negative.   Afebrile in clinic. Sating 98% room air. Clear lungs throughout. However with deep breathing causing chest tightness, albuterol  nebulizer give. Reporting improvement. Discussion with patient about viral etiology, supportive care. Have prescribed mucinex, promethazine  DM, albuterol  inhaler TID.  Flexeril  at bedtime for body aches - drowsy precautions  Avoid  steroids due to uncontrolled diabetes Return precautions verbalized. Patient agrees to plan, no questions   Final Clinical Impressions(s) / UC Diagnoses   Final diagnoses:  Viral URI with cough  Mild intermittent asthma with acute exacerbation  Body aches     Discharge Instructions      Please use inhaler 3 times daily (every 6 hours) for the next several days. Then continue as needed.  Guaifenacin (plain mucinex) with lots of fluids for congestion   The promethazine  DM cough syrup can be used up to 4 times daily. If this medication makes you drowsy, take only once before bed.  Allow 3-4 more days for improvement. If after this time you are not feeling better, please return      ED Prescriptions     Medication Sig Dispense Auth. Provider   albuterol  (VENTOLIN  HFA) 108 (90 Base) MCG/ACT inhaler Inhale 2 puffs into the lungs every  6 (six) hours as needed for wheezing or shortness of breath. 8 g Bessye Stith, PA-C   guaiFENesin (MUCINEX) 600 MG 12 hr tablet Take 2 tablets (1,200 mg total) by mouth 2 (two) times daily. 30 tablet Aslynn Brunetti, PA-C   promethazine -dextromethorphan (PROMETHAZINE -DM) 6.25-15 MG/5ML syrup Take 5 mLs by mouth 4 (four) times daily as needed for cough. 240 mL Dion Sibal, PA-C   cyclobenzaprine  (FLEXERIL ) 10 MG tablet Take 1 tablet (10 mg total) by mouth at bedtime as needed for muscle spasms. 20 tablet Neri Samek, Asberry, PA-C      PDMP not reviewed this encounter.   Devesh Monforte, PA-C 12/31/23 1444    Demetry Bendickson, [place]berry, [place] 12/31/23 1514

## 2023-12-31 NOTE — Discharge Instructions (Addendum)
 Please use inhaler 3 times daily (every 6 hours) for the next several days. Then continue as needed.  Guaifenacin (plain mucinex) with lots of fluids for congestion   The promethazine  DM cough syrup can be used up to 4 times daily. If this medication makes you drowsy, take only once before bed.  Allow 3-4 more days for improvement. If after this time you are not feeling better, please return

## 2024-01-01 ENCOUNTER — Ambulatory Visit

## 2024-01-03 ENCOUNTER — Ambulatory Visit: Admitting: Podiatry

## 2024-01-04 ENCOUNTER — Ambulatory Visit

## 2024-01-07 ENCOUNTER — Ambulatory Visit

## 2024-01-15 ENCOUNTER — Encounter: Payer: Self-pay | Admitting: Radiology

## 2024-01-18 ENCOUNTER — Ambulatory Visit

## 2024-01-29 ENCOUNTER — Ambulatory Visit: Admitting: Podiatry

## 2024-01-30 ENCOUNTER — Ambulatory Visit

## 2024-02-07 ENCOUNTER — Encounter: Payer: Self-pay | Admitting: Podiatry

## 2024-02-07 ENCOUNTER — Ambulatory Visit: Admitting: Podiatry

## 2024-02-07 DIAGNOSIS — S9031XD Contusion of right foot, subsequent encounter: Secondary | ICD-10-CM | POA: Diagnosis not present

## 2024-02-07 NOTE — Progress Notes (Signed)
  Subjective:  Patient ID: Deborah Mcclain, female    DOB: 10/07/1970,   MRN: [id number]  Chief Complaint  Patient presents with   Foot Pain    R foot is feeling 50% better. Wore post op shoe for a couple weeks and elevated. Is wearing compression socks and sneakers now.  Diabetic Patient states A1c 11.0 2 months ago.     53 y.o. female presents for follow-up of right foot pain relates she is doing about 50% better. She wore the shoe for a couple weeks and then stopped. Mostly just been wearing compression stockings and sneakers now.   Patient is diabetic and last A1c was  Lab Results  Component Value Date   HGBA1C 14.0 (H) 01/09/2020   .   PCP:  Duwaine Annabella SAILOR, FNP     . Denies any other pedal complaints. Denies n/v/f/c.   Past Medical History:  Diagnosis Date   Acanthosis nigricans 10/21/2021   Allergic rhinitis 10/21/2021   Asthma    Eczema    Hypertension    Type 2 diabetes mellitus (HCC) 12/2018    Objective:  Physical Exam: Vascular: DP/PT pulses 1/4 bilateral. CFT <3 seconds. Normal hair growth on digits. No edema.  Skin. No lacerations or abrasions bilateral feet.  Musculoskeletal: MMT 5/5 bilateral lower extremities in DF, PF, Inversion and Eversion. Deceased ROM in DF of ankle joint. Tender over distal dorsal fourth and fifth metatarsal base area. Eccymosis and skin changesnoted in the area.  Neurological: Sensation intact to light touch.   Summary: Right: Resting right ankle-brachial index is within normal range. The right toe-brachial index is normal.   Left: Resting left ankle-brachial index is within normal range. The left toe-brachial index is normal.    Assessment:   1. Contusion of right foot, subsequent encounter       Plan:  Patient was evaluated and treated and all questions answered. -Xrays reviewed. No acute fracture or kislcoations noted.  -Discussed treatement options for contusion of foot; risks, alternatives, and benefits  explained. -Continue WBAT in regular shoes.  -Recommend protection, rest, ice, elevation daily until symptoms improve -Rx pain med/anti-inflammatories as needed -ABIs reviewed and wnl.  -Patient to return to office as needed.  Asberry Failing, DPM

## 2024-02-21 ENCOUNTER — Ambulatory Visit
Admission: RE | Admit: 2024-02-21 | Discharge: 2024-02-21 | Disposition: A | Discharge status: Home / Self Care | Source: Ambulatory Visit | Attending: Family | Admitting: Family

## 2024-02-21 DIAGNOSIS — Z1231 Encounter for screening mammogram for malignant neoplasm of breast: Secondary | ICD-10-CM

## 2024-02-23 NOTE — Telephone Encounter (Signed)
 Patient was seen on 12/06/2023

## 2024-03-11 ENCOUNTER — Ambulatory Visit
Admission: RE | Admit: 2024-03-11 | Discharge: 2024-03-11 | Disposition: A | Discharge status: Home / Self Care | Source: Ambulatory Visit | Attending: Family Medicine | Admitting: Family Medicine

## 2024-03-11 ENCOUNTER — Ambulatory Visit

## 2024-03-11 VITALS — BP 153/93 | HR 99 | Temp 98.2°F | Resp 18

## 2024-03-11 DIAGNOSIS — M5441 Lumbago with sciatica, right side: Secondary | ICD-10-CM

## 2024-03-11 DIAGNOSIS — B379 Candidiasis, unspecified: Secondary | ICD-10-CM | POA: Diagnosis not present

## 2024-03-11 MED ORDER — CYCLOBENZAPRINE HCL 10 MG PO TABS: 10.0000 mg | ORAL_TABLET | Freq: Two times a day (BID) | ORAL | 0 refills | Status: AC | PRN

## 2024-03-11 MED ORDER — KETOROLAC TROMETHAMINE 30 MG/ML IJ SOLN
30.0000 mg | Freq: Once | INTRAMUSCULAR | Status: AC
  Administered 2024-03-11: 30 mg via INTRAMUSCULAR

## 2024-03-11 MED ORDER — FLUCONAZOLE 100 MG PO TABS: ORAL_TABLET | ORAL | 0 refills | Status: AC

## 2024-03-11 MED ORDER — KETOCONAZOLE 2 % EX CREA: 1.0000 | TOPICAL_CREAM | Freq: Every day | CUTANEOUS | 1 refills | Status: AC

## 2024-03-11 MED ORDER — KETOROLAC TROMETHAMINE 10 MG PO TABS: 10.0000 mg | ORAL_TABLET | Freq: Four times a day (QID) | ORAL | 0 refills | Status: AC | PRN

## 2024-03-11 NOTE — ED Provider Notes (Signed)
 " [name]    [name]: [id number] Arrival date & time: 03/11/24  1245      History   Chief Complaint Chief Complaint  Patient presents with   Back Pain    Lower back pain as well as thigh and knee pain - Entered by patient    HPI Deborah Mcclain is a 53 y.o. female.    Back Pain  Here for pain in her right low back.  It has been radiating down into her right thigh.  It began bothering her about a week ago.  Tylenol  has only given her a brief mild relief.  No fever or dysuria or hematuria or rash.  NKDA  She does have a history of diabetes and sugars have been high lately.  She notes that she has some yeasty rash under her breasts and between her breasts and in her perineum.  She is postmenopausal and her last menstrual cycle was in 2021  Past Medical History:  Diagnosis Date   Acanthosis nigricans 10/21/2021   Allergic rhinitis 10/21/2021   Asthma    Eczema    Hypertension    Type 2 diabetes mellitus (HCC) 12/2018    Patient Active Problem List   Diagnosis Date Noted   Class 3 severe obesity due to excess calories with serious comorbidity and body mass index (BMI) of 45.0 to 49.9 in adult [facility]) 02/22/2023   Acute cholecystitis 06/01/2022   GERD (gastroesophageal reflux disease) 02/15/2022   Mild intermittent asthma 02/15/2022   Sciatica 07/12/2021   Chronic right-sided low back pain with right-sided sciatica 07/12/2021   Pain due to onychomycosis of toenails of both feet 12/02/2020   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   Leucocytosis 09/10/2020   Vitamin D deficiency 09/10/2020   Hyperlipidemia 01/20/2020   Iron deficiency anemia 01/20/2020   Hyperglycemia due to diabetes mellitus (HCC) 01/09/2020   Anemia 12/03/2019   Abnormal uterine bleeding (AUB) 12/03/2019   History of blood transfusion 12/03/2019   Essential hypertension 04/16/2019   Type 2 diabetes mellitus with obesity 04/16/2019   Morbid obesity with BMI of 50.0-59.9, adult (HCC)  04/16/2019   Menopausal vasomotor syndrome 04/16/2019   Type 2 diabetes mellitus with diabetic polyneuropathy, with long-term current use of insulin  (HCC) 04/16/2019    Past Surgical History:  Procedure Laterality Date   CESAREAN SECTION  05/25/1997   CESAREAN SECTION  03/30/2005   twins   CHOLECYSTECTOMY N/A 06/01/2022   Procedure: LAPAROSCOPIC CHOLECYSTECTOMY;  Surgeon: Belinda Cough, MD;  Location: [facility] OR;  Service: General;  Laterality: N/A;   ENDOMETRIAL BIOPSY  12/03/2019    OB History     Gravida  2   Para  2   Term  1   Preterm  1   AB      Living  3      SAB      IAB      Ectopic      Multiple  1   Live Births  3            Home Medications    Prior to Admission medications  Medication Sig Start Date End Date Taking? Authorizing Provider  cyclobenzaprine  (FLEXERIL ) 10 MG tablet Take 1 tablet (10 mg total) by mouth 2 (two) times daily as needed for muscle spasms. 03/11/24  Yes Vonna Sharlet POUR, MD  fluconazole  (DIFLUCAN ) 100 MG tablet 2 tablets by mouth the first day, then 1 tablet daily for 6 more days. 03/11/24  Yes Vonna Sharlet POUR, MD  ketoconazole (NIZORAL) 2 % cream Apply 1 Application topically daily. To affected area till better 03/11/24  Yes Tika Hannis, Sharlet POUR, MD  ketorolac  (TORADOL ) 10 MG tablet Take 1 tablet (10 mg total) by mouth every 6 (six) hours as needed (pain). 03/11/24  Yes Vonna Sharlet POUR, MD  ACCU-CHEK GUIDE test strip 1 each by Other route as needed. 11/15/22   [provider]  Accu-Chek Softclix Lancets lancets USE AS DIRECTED TO CHECK BLOOD SUGAR TWICE DAILY 09/23/22   [provider]  Adapalene-Benzoyl Peroxide 0.3-2.5 % GEL Apply 1 Application topically at bedtime.    [provider]  albuterol  (VENTOLIN  HFA) 108 (90 Base) MCG/ACT inhaler Inhale 2 puffs into the lungs every 6 (six) hours as needed for wheezing or shortness of breath. 12/31/23   Rising, Asberry, PA-C  amLODipine  (NORVASC ) 10 MG  tablet Take 10 mg by mouth daily. 07/12/21   [provider]  amLODipine  (NORVASC ) 10 MG tablet Take 1 tablet by mouth daily. 01/23/23   [provider]  atorvastatin  (LIPITOR) 40 MG tablet Take 1 tablet (40 mg total) by mouth at bedtime. 01/12/20   Ghimire, Donalda HERO, MD  B-D ULTRAFINE III SHORT PEN 31G X 8 MM MISC 2 (two) times daily. 09/23/22   [provider]  Clobetasol Propionate 0.05 % shampoo Apply 1 Application topically every 14 (fourteen) days. 02/28/22   [provider]  famotidine (PEPCID) 20 MG tablet Take 20 mg by mouth daily. 09/05/19   [provider]  fluticasone  (FLONASE ) 50 MCG/ACT nasal spray Place 1 spray into both nostrils daily for 3 days. 12/03/21 02/07/24  Hazen Darryle BRAVO, FNP  fluticasone  (FLONASE ) 50 MCG/ACT nasal spray Place 2 sprays into both nostrils daily. 06/11/19   [provider]  folic acid (FOLVITE) 1 MG tablet Take by mouth. 01/10/23   [provider]  folic acid (FOLVITE) 1 MG tablet Take 1 mg by mouth daily. 04/07/23   [provider]  folic acid (FOLVITE) 1 MG tablet Take 1 mg by mouth daily. 02/12/23   [provider]  guaiFENesin  (MUCINEX ) 600 MG 12 hr tablet Take 2 tablets (1,200 mg total) by mouth 2 (two) times daily. 12/31/23   Rising, Asberry, PA-C  insulin  aspart (NOVOLOG ) 100 UNIT/ML FlexPen 0-20 Units, Subcutaneous, 3 times daily with meals CBG < 70: Implement Hypoglycemia measures, call MD CBG 70 - 120: 0 units CBG 121 - 150: 3 units CBG 151 - 200: 4 units CBG 201 - 250: 7 units CBG 251 - 300: 11 units CBG 301 - 350: 15 units CBG 351 - 400: 20 units CBG > 400: call MD Patient taking differently: Inject 7 Units into the skin in the morning and at bedtime. 06/30/20   Arloa Suzen RAMAN, NP  insulin  glargine-yfgn (SEMGLEE , YFGN,) 100 UNIT/ML Pen Inject 46 Units into the skin 2 (two) times daily. 46 units am and 36 units pm 06/23/23   [provider]  LANTUS  SOLOSTAR 100  UNIT/ML Solostar Pen 46 units in am and 36 units in pm 12/03/21   Mound, Haley E, FNP  levocetirizine (XYZAL ) 5 MG tablet Take 5 mg by mouth every evening.    [provider]  lidocaine  (LIDODERM ) 5 % Place 1 patch onto the skin daily. Remove & Discard patch within 12 hours or as directed by MD 07/22/23   Teresa Almarie LABOR, PA-C  metFORMIN  (GLUCOPHAGE ) 1000 MG tablet Take 1,000 mg by mouth 2 (two) times daily with a meal.  [provider]  metFORMIN  (GLUCOPHAGE ) 1000 MG tablet Take 1 tablet by mouth 2 (two) times daily. 01/20/22 02/07/24  [provider]  metoprolol  tartrate (LOPRESSOR ) 50 MG tablet Take 1 tablet (50 mg total) by mouth 2 (two) times daily. 01/12/20   Ghimire, Donalda HERO, MD  phenol (CHLORASEPTIC) 1.4 % LIQD Use as directed 1 spray in the mouth or throat as needed. 05/20/23   Hazen Darryle BRAVO, FNP  pregabalin  (LYRICA ) 25 MG capsule Take 25 mg by mouth 2 (two) times daily. RANOUT    [provider]  rosuvastatin (CRESTOR) 5 MG tablet Take 5 mg by mouth daily.    [provider]  Semaglutide,0.25 or 0.5MG /DOS, 2 MG/3ML SOPN Inject into the skin. 02/20/23   [provider]  triamcinolone  cream (KENALOG ) 0.1 % Apply 1 application topically 2 (two) times daily. 07/24/20   Wieters, Hallie C, PA-C  valsartan (DIOVAN) 80 MG tablet Take 80 mg by mouth daily.    [provider]  venlafaxine  XR (EFFEXOR -XR) 75 MG 24 hr capsule Take 1 capsule (75 mg total) by mouth daily. 12/26/19   Leftwich-Kirby, Olam LABOR, CNM  venlafaxine  XR (EFFEXOR -XR) 75 MG 24 hr capsule Take 75 mg by mouth daily with breakfast. 08/19/19   [provider]    Family History Family History  Problem Relation Age of Onset   Breast cancer Mother    Diabetes Mother    Hypertension Mother    Diabetes Maternal Grandmother     Social History Social History[1]   Allergies   Other and Pollen extract   Review of Systems Review of Systems  Musculoskeletal:   Positive for back pain.     Physical Exam Triage Vital Signs ED Triage Vitals  Encounter Vitals Group     BP 03/11/24 1335 (!) 153/93     Girls Systolic BP Percentile --      Girls Diastolic BP Percentile --      Boys Systolic BP Percentile --      Boys Diastolic BP Percentile --      Pulse Rate 03/11/24 1335 99     Resp 03/11/24 1335 18     Temp 03/11/24 1335 98.2 F (36.8 C)     Temp Source 03/11/24 1335 Oral     SpO2 03/11/24 1335 93 %     Weight --      Height --      Head Circumference --      Peak Flow --      Pain Score 03/11/24 1334 8     Pain Loc --      Pain Education --      Exclude from Growth Chart --    No data found.  Updated Vital Signs BP (!) 153/93 (BP Location: Left Arm)   Pulse 99   Temp 98.2 F (36.8 C) (Oral)   Resp 18   LMP 01/31/2020   SpO2 93%   Visual Acuity Right Eye Distance:   Left Eye Distance:   Bilateral Distance:    Right Eye Near:   Left Eye Near:    Bilateral Near:     Physical Exam Vitals reviewed.  Constitutional:      General: She is not in acute distress.    Appearance: She is not ill-appearing, toxic-appearing or diaphoretic.  HENT:     Mouth/Throat:     Mouth: Mucous membranes are moist.     Pharynx: No oropharyngeal exudate or posterior oropharyngeal erythema.  Eyes:  Extraocular Movements: Extraocular movements intact.     Conjunctiva/sclera: Conjunctivae normal.     Pupils: Pupils are equal, round, and reactive to light.  Cardiovascular:     Rate and Rhythm: Normal rate and regular rhythm.     Heart sounds: No murmur heard. Pulmonary:     Effort: Pulmonary effort is normal. No respiratory distress.     Breath sounds: No stridor. No wheezing, rhonchi or rales.  Musculoskeletal:     Cervical back: Neck supple.     Comments: There is some tenderness of her right lumbosacral area.  No deformity.  Straight leg raise causes increased pain into the thigh.  Lymphadenopathy:     Cervical: No cervical  adenopathy.  Skin:    Capillary Refill: Capillary refill takes less than 2 seconds.     Coloration: Skin is not jaundiced or pale.     Comments: She shows me a confluent mildly erythematous rash, consistent with candidiasis, between her breasts.  Neurological:     General: No focal deficit present.     Mental Status: She is alert and oriented to person, place, and time.  Psychiatric:        Behavior: Behavior normal.      UC Treatments / Results  Labs (all labs ordered are listed, but only abnormal results are displayed) Labs Reviewed - No data to display  EKG   Radiology No results found.  Procedures Procedures (including critical care time)  Medications Ordered in UC Medications  ketorolac  (TORADOL ) 30 MG/ML injection 30 mg (30 mg Intramuscular Given 03/11/24 1422)    Initial Impression / Assessment and Plan / UC Course  I have reviewed the triage vital signs and the nursing notes.  Pertinent labs & imaging results that were available during my care of the patient were reviewed by me and considered in my medical decision making (see chart for details).     Toradol  injection is provided here and Toradol  tablets are sent to the pharmacy.  Also sent her in some Flexeril  which she has taken in the past with good effect.  I have asked her to follow-up with her primary care  Also fluconazole  sent in for a week supply to take for the extensive Candida rash.  Nizoral is also sent in, but I think is going to be difficult for her to apply that sufficiently since the rash is so extensive.  Final Clinical Impressions(s) / UC Diagnoses   Final diagnoses:  Acute right-sided low back pain with right-sided sciatica  Candidiasis     Discharge Instructions      You have been given a shot of Toradol  30 mg today.  Ketorolac  10 mg tablets--take 1 tablet every 6 hours as needed for pain.  This is the same medicine that is in the shot we just gave you  Cyclobenzaprine  10  mg--1 tablet 2 times daily as needed for muscle spasm or muscle pain.  This medication can make you dizzy or sleepy  Fluconazole  100 mg--2 tablets by mouth the first day then 1 tablet by mouth daily for 6 more days.  Ketoconazole cream--apply once daily to the red area till better.   Please follow-up with your primary care    ED Prescriptions     Medication Sig Dispense Auth. Provider   ketorolac  (TORADOL ) 10 MG tablet Take 1 tablet (10 mg total) by mouth every 6 (six) hours as needed (pain). 20 tablet Vonna Sharlet POUR, MD   cyclobenzaprine  (FLEXERIL ) 10 MG tablet Take 1 tablet (  10 mg total) by mouth 2 (two) times daily as needed for muscle spasms. 20 tablet Karstyn Birkey, Sharlet POUR, MD   fluconazole  (DIFLUCAN ) 100 MG tablet 2 tablets by mouth the first day, then 1 tablet daily for 6 more days. 8 tablet Nithya Meriweather K, MD   ketoconazole (NIZORAL) 2 % cream Apply 1 Application topically daily. To affected area till better 60 g Vonna Sharlet POUR, MD      PDMP not reviewed this encounter.    [1]  Social History Tobacco Use   Smoking status: Never    Passive exposure: Never   Smokeless tobacco: Never  Vaping Use   Vaping status: Never Used  Substance Use Topics   Alcohol use: Not Currently    Comment: occ   Drug use: Yes    Types: Marijuana    Comment: eat edibles     Vonna Sharlet POUR, MD 03/11/24 1446  "

## 2024-03-11 NOTE — ED Triage Notes (Signed)
 Patient having pain from right said of butt down through her thigh.  Pain started last week and she thought if she took tylenol  and rest it would get better

## 2024-03-11 NOTE — Discharge Instructions (Addendum)
 You have been given a shot of Toradol  30 mg today.  Ketorolac  10 mg tablets--take 1 tablet every 6 hours as needed for pain.  This is the same medicine that is in the shot we just gave you  Cyclobenzaprine  10 mg--1 tablet 2 times daily as needed for muscle spasm or muscle pain.  This medication can make you dizzy or sleepy  Fluconazole  100 mg--2 tablets by mouth the first day then 1 tablet by mouth daily for 6 more days.  Ketoconazole cream--apply once daily to the red area till better.   Please follow-up with your primary care

## 2024-04-18 ENCOUNTER — Encounter: Admitting: Obstetrics and Gynecology

## 2024-05-06 ENCOUNTER — Ambulatory Visit: Admitting: Obstetrics and Gynecology

## 2024-06-17 ENCOUNTER — Ambulatory Visit: Admitting: Obstetrics and Gynecology
